# Patient Record
Sex: Male | Born: 1935
Health system: Southern US, Community
[De-identification: ages and names within clinical notes are randomized; demographics above are authoritative.]

## PROBLEM LIST (undated history)

## (undated) DIAGNOSIS — C4359 Malignant melanoma of other part of trunk: Secondary | ICD-10-CM

## (undated) DIAGNOSIS — N400 Enlarged prostate without lower urinary tract symptoms: Secondary | ICD-10-CM

## (undated) DIAGNOSIS — Z86010 Personal history of colonic polyps: Secondary | ICD-10-CM

## (undated) DIAGNOSIS — Z8739 Personal history of other diseases of the musculoskeletal system and connective tissue: Secondary | ICD-10-CM

## (undated) DIAGNOSIS — D229 Melanocytic nevi, unspecified: Secondary | ICD-10-CM

## (undated) DIAGNOSIS — K5790 Diverticulosis of intestine, part unspecified, without perforation or abscess without bleeding: Secondary | ICD-10-CM

## (undated) DIAGNOSIS — Z860101 Personal history of adenomatous and serrated colon polyps: Secondary | ICD-10-CM

## (undated) DIAGNOSIS — D696 Thrombocytopenia, unspecified: Secondary | ICD-10-CM

## (undated) DIAGNOSIS — J309 Allergic rhinitis, unspecified: Secondary | ICD-10-CM

## (undated) DIAGNOSIS — L570 Actinic keratosis: Secondary | ICD-10-CM

## (undated) DIAGNOSIS — H409 Unspecified glaucoma: Secondary | ICD-10-CM

## (undated) DIAGNOSIS — M7580 Other shoulder lesions, unspecified shoulder: Secondary | ICD-10-CM

## (undated) HISTORY — PX: OTHER SURGICAL HISTORY: SHX169

## (undated) HISTORY — DX: Malignant melanoma of other part of trunk: C43.59

## (undated) HISTORY — DX: Diverticulosis of intestine, part unspecified, without perforation or abscess without bleeding: K57.90

## (undated) HISTORY — DX: Actinic keratosis: L57.0

## (undated) HISTORY — PX: HERNIA REPAIR: SHX51

## (undated) HISTORY — DX: Personal history of adenomatous and serrated colon polyps: Z86.0101

## (undated) HISTORY — DX: Personal history of colonic polyps: Z86.010

## (undated) HISTORY — DX: Personal history of other diseases of the musculoskeletal system and connective tissue: Z87.39

## (undated) HISTORY — DX: Benign prostatic hyperplasia without lower urinary tract symptoms: N40.0

## (undated) HISTORY — DX: Melanocytic nevi, unspecified: D22.9

## (undated) HISTORY — DX: Thrombocytopenia, unspecified: D69.6

## (undated) HISTORY — DX: Other shoulder lesions, unspecified shoulder: M75.80

## (undated) HISTORY — DX: Unspecified glaucoma: H40.9

## (undated) HISTORY — DX: Allergic rhinitis, unspecified: J30.9

---

## 1998-07-23 ENCOUNTER — Encounter: Admission: RE | Admit: 1998-07-23 | Discharge: 1998-08-16 | Payer: Self-pay | Admitting: Neurological Surgery

## 2000-11-13 ENCOUNTER — Encounter: Payer: Self-pay | Admitting: Family Medicine

## 2000-11-13 ENCOUNTER — Ambulatory Visit (HOSPITAL_COMMUNITY): Admission: RE | Admit: 2000-11-13 | Discharge: 2000-11-13 | Payer: Self-pay | Admitting: Family Medicine

## 2001-08-30 ENCOUNTER — Ambulatory Visit (HOSPITAL_BASED_OUTPATIENT_CLINIC_OR_DEPARTMENT_OTHER): Admission: RE | Admit: 2001-08-30 | Discharge: 2001-08-30 | Payer: Self-pay | Admitting: Plastic Surgery

## 2001-08-30 ENCOUNTER — Encounter (INDEPENDENT_AMBULATORY_CARE_PROVIDER_SITE_OTHER): Payer: Self-pay | Admitting: Specialist

## 2002-07-18 ENCOUNTER — Ambulatory Visit (HOSPITAL_COMMUNITY): Admission: RE | Admit: 2002-07-18 | Discharge: 2002-07-18 | Payer: Self-pay | Admitting: Gastroenterology

## 2007-04-14 ENCOUNTER — Encounter: Admission: RE | Admit: 2007-04-14 | Discharge: 2007-04-14 | Payer: Self-pay | Admitting: Surgery

## 2007-04-19 ENCOUNTER — Ambulatory Visit (HOSPITAL_BASED_OUTPATIENT_CLINIC_OR_DEPARTMENT_OTHER): Admission: RE | Admit: 2007-04-19 | Discharge: 2007-04-19 | Payer: Self-pay | Admitting: Surgery

## 2010-05-21 NOTE — Op Note (Signed)
NAMEMICHALE, EMMERICH             ACCOUNT NO.:  [id number]   MEDICAL RECORD NO.:  [id number]          PATIENT TYPE:  AMB   LOCATION:  DSC                          FACILITY:  [facility]   PHYSICIAN:  Currie Paris, M.D.DATE OF BIRTH:  08-Mar-1930   DATE OF PROCEDURE:  04/19/2007  DATE OF DISCHARGE:                               OPERATIVE REPORT   PREOPERATIVE DIAGNOSIS:  Left inguinal hernia.   POSTOPERATIVE DIAGNOSIS:  Left inguinal hernia - direct.   SURGEON:  Currie Paris, M.D.   ANESTHESIA:  General.   CLINICAL HISTORY:  Mr. Grasse is a 77 year old gentleman has a  gradually enlarging left inguinal hernia he wished to have repaired.   DESCRIPTION OF PROCEDURE:  The patient was seen in the holding area, and  he had no further questions.  We reviewed plans for the surgery, and we  both initialed the left side as the operative side.   The patient was taken to the operating room after satisfactory general  (LMA).  Anesthesia having obtained, the left inguinal area was clipped,  prepped, and draped.  The time-out was done.   I used 0.25% plain Marcaine, which I infiltrated along the incision  line, into the subcutaneous tissues, and below the external oblique to  help with postop analgesia.   An oblique incision was made and deepened to the external oblique  aponeurosis with bleeders tied or cauterized.  The aponeurosis was  opened in line of its fibers into the superficial ring.   Cord structures were dissected up off the inguinal floor.  There was no  indirect sac present.  There was a large direct defect involving most of  the floor, and it was stuck predominantly to the very distal side of the  cord medially.  I was able to strip all this off and get the cord  completely freed up and the hernia completely reduced such that I could  easily see the fascia and the pubic tubercle.  I took a 2-0 Prolene and  tried to close the transversalis running medially to  laterally so that  it could hold the hernia in place, but with no tension on this.  I then  took a piece of Marlex mesh and cut it to fit, tapered medially, and  slid laterally so that it could go around the cord.  This was secured  into the patient with a running 2-0 Prolene starting at the tubercle and  running laterally to the deep ring.  It was slid to go around the cord  and then tacked to the internal oblique medially such that we had a full  coverage of the entire floor, and it extended well lateral to the deep  ring.   I put more Marcaine in as we worked, and I think the patient should have  good postop pain control from this.  I irrigated and made sure  everything was dry.  I then closed with 3-0 Vicryl to close the external  oblique, 3-0 Vicryl on Scarpa's, and a 4-0 Monocryl subcuticular plus  Dermabond for the skin.  The patient tolerated the procedure  well, and  there were no complications.      Currie Paris, M.D.  Electronically Signed     CJS/MEDQ  D:  04/19/2007  T:  04/20/2007  Job:  [id number]   cc:   Soyla Murphy. Renne Crigler, M.D.

## 2010-05-24 NOTE — Op Note (Signed)
   NAME:  Brandon Norris, Brandon Norris [facility]                    ACCOUNT NO.:  [id number]   MEDICAL RECORD NO.:  [id number]                   PATIENT TYPE:  AMB   LOCATION:  ENDO                                 FACILITY:  [facility]   PHYSICIAN:  Bernette Redbird, M.D.                DATE OF BIRTH:  07-17-1930   DATE OF PROCEDURE:  07/18/2002  DATE OF DISCHARGE:                                 OPERATIVE REPORT   PROCEDURE PERFORMED:  Colonoscopy.   ENDOSCOPIST:  Florencia Reasons, M.D.   INDICATIONS FOR PROCEDURE:  Screening for colon cancer in a 77 year old male  with occasional small volume hematochezia.   FINDINGS:  Sigmoid diverticulosis.   DESCRIPTION OF PROCEDURE:  The nature, purpose and risks of the procedure  had been discussed with the patient, who provided written consent.  Sedation  was fentanyl 20 mcg and Versed 3 mg IV without arrhythmias or desaturation.  Digital exam of the prostate was unremarkable.   The Olympus adjustable tension pediatric video colonoscope was advanced to  the terminal ileum and pullback was then performed.  Apart from fairly  significant sigmoid diverticulosis, this was a normal examination.  The  terminal ileum looked normal and the colon was free of any polyps, cancer,  colitis or vascular malformations.  The quality of the prep was very good  and it is felt that all areas were adequately seen.  Retroflexion in the  rectum and reinspection of the rectosigmoid was unremarkable.   There were perhaps very mild internal hemorrhoids during pullout through the  anal canal but nothing particularly impressive.  No biopsies were obtained.  The patient tolerated the procedure well and there were no apparent  complications.   IMPRESSION:  1. Sigmoid diverticulosis.  2.     Intermittent rectal bleeding presumably of hemorrhoidal origin given the     absence of alternative explanations (the character of his bleeding does     not sound diverticular).   PLAN:  Flexible  sigmoidoscopy in five years for continued screening.                                                Bernette Redbird, M.D.    RB/MEDQ  D:  07/18/2002  T:  07/18/2002  Job:  [id number]   cc:   Meredith Staggers, M.D.  [address], [address]  [place]  [place] [address]  Fax: [phone]

## 2010-05-24 NOTE — Op Note (Signed)
NAME:  Brandon Norris, Brandon Norris [facility]                    ACCOUNT NO.:  [id number]   MEDICAL RECORD NO.:  [id number]                   PATIENT TYPE:  AMB   LOCATION:  DSC                                  FACILITY:  [facility]   PHYSICIAN:  Teena Irani. Odis Luster, M.D.               DATE OF BIRTH:  07-19-1930   DATE OF PROCEDURE:  08/30/2001  DATE OF DISCHARGE:  08/30/2001                                 OPERATIVE REPORT   PREOPERATIVE DIAGNOSIS:  Basal cell carcinoma of the forehead, greater than  0.5 cm in diameter.   POSTOPERATIVE DIAGNOSES:  1. Basal cell carcinoma of the forehead, greater than 0.5 cm in diameter.  2. Complicated open wound of the forehead, greater than 2.5 cm.   PROCEDURES:  1. Excision of basal cell carcinoma greater than 0.5 cm in diameter.  2. Complex wound closure of the forehead, greater than 2.5 cm.   SURGEON:  Teena Irani. Odis Luster, M.D.   ANESTHESIA:  1% Xylocaine with epinephrine plus bicarbonate.   CLINICAL NOTE:  A 77 year old man with a basal cell carcinoma of his  forehead identified on punch biopsy by his dermatologist.  This is fairly  large, and he presents for a wide excision of this area.  The nature of the  procedure and the risks were discussed with him, including the possibility  of further surgery depending upon the final pathology report.  He understood  all this and wished to proceed.   DESCRIPTION OF PROCEDURE:  The patient was placed supine on the operating  table.  The excision was then marked, taking approximately 4 mm margins  around this tumor.  The incisions were then placed within skin creased with  the relaxed skin tension lines.  He was prepped with Betadine and draped  with sterile drapes.  Satisfactory local anesthesia with 1% Xylocaine with  epinephrine plus bicarb was then achieved, and the elliptical excision was  performed with the specimen tagged on its anterior border with a silk  stitch.  The wound was irrigated thoroughly.  The closure in  layers with 4-0  Vicryl interrupted, inverted deep sutures, 4-0 Vicryl interrupted, inverted  subcutaneous sutures, and 6-0 Prolene simple running sutures.  Antibiotic  ointment and a dry sterile dressing with a little bit of pressure were  applied, and he tolerated the procedure well.   DISPOSITION:  No vigorous activity, no lifting today.  He will leave the  dressing in place until tomorrow night, and then he may remove it and shower  if he so desires.  Antibiotic ointment, Polysporin, a couple of times a day.  Will see him back in the office next week for recheck and suture removal.  Teena Irani. Odis Luster, M.D.    DMB/MEDQ  D:  08/30/2001  T:  09/01/2001  Job:  [id number]

## 2010-10-01 LAB — POCT HEMOGLOBIN-HEMACUE: Hemoglobin: 14

## 2014-01-06 DIAGNOSIS — C4359 Malignant melanoma of other part of trunk: Secondary | ICD-10-CM

## 2014-01-06 HISTORY — PX: OTHER SURGICAL HISTORY: SHX169

## 2014-01-06 HISTORY — DX: Malignant melanoma of other part of trunk: C43.59

## 2015-02-15 DIAGNOSIS — H2513 Age-related nuclear cataract, bilateral: Secondary | ICD-10-CM | POA: Diagnosis not present

## 2015-02-15 DIAGNOSIS — H401123 Primary open-angle glaucoma, left eye, severe stage: Secondary | ICD-10-CM | POA: Diagnosis not present

## 2015-02-15 DIAGNOSIS — H35372 Puckering of macula, left eye: Secondary | ICD-10-CM | POA: Diagnosis not present

## 2015-02-15 DIAGNOSIS — H401113 Primary open-angle glaucoma, right eye, severe stage: Secondary | ICD-10-CM | POA: Diagnosis not present

## 2015-08-15 DIAGNOSIS — H35372 Puckering of macula, left eye: Secondary | ICD-10-CM | POA: Diagnosis not present

## 2015-08-15 DIAGNOSIS — H401132 Primary open-angle glaucoma, bilateral, moderate stage: Secondary | ICD-10-CM | POA: Diagnosis not present

## 2015-10-30 DIAGNOSIS — Z23 Encounter for immunization: Secondary | ICD-10-CM | POA: Diagnosis not present

## 2016-01-09 DIAGNOSIS — Z7982 Long term (current) use of aspirin: Secondary | ICD-10-CM | POA: Diagnosis not present

## 2016-01-09 DIAGNOSIS — Z0000 Encounter for general adult medical examination without abnormal findings: Secondary | ICD-10-CM | POA: Diagnosis not present

## 2016-01-14 DIAGNOSIS — Z0001 Encounter for general adult medical examination with abnormal findings: Secondary | ICD-10-CM | POA: Diagnosis not present

## 2016-01-14 DIAGNOSIS — J309 Allergic rhinitis, unspecified: Secondary | ICD-10-CM | POA: Diagnosis not present

## 2016-01-14 DIAGNOSIS — K5730 Diverticulosis of large intestine without perforation or abscess without bleeding: Secondary | ICD-10-CM | POA: Diagnosis not present

## 2016-01-14 DIAGNOSIS — R238 Other skin changes: Secondary | ICD-10-CM | POA: Diagnosis not present

## 2016-01-14 DIAGNOSIS — Z1212 Encounter for screening for malignant neoplasm of rectum: Secondary | ICD-10-CM | POA: Diagnosis not present

## 2016-01-15 ENCOUNTER — Telehealth: Payer: Self-pay | Admitting: Oncology

## 2016-01-15 NOTE — Telephone Encounter (Signed)
Pt confirmed appt and verified demo and insurance. Contacted referring provider with appt date/time.

## 2016-01-16 ENCOUNTER — Ambulatory Visit (HOSPITAL_BASED_OUTPATIENT_CLINIC_OR_DEPARTMENT_OTHER): Payer: Medicare Other

## 2016-01-16 ENCOUNTER — Ambulatory Visit (HOSPITAL_BASED_OUTPATIENT_CLINIC_OR_DEPARTMENT_OTHER): Payer: Medicare Other | Admitting: Oncology

## 2016-01-16 ENCOUNTER — Telehealth: Payer: Self-pay | Admitting: Oncology

## 2016-01-16 ENCOUNTER — Other Ambulatory Visit: Payer: Self-pay | Admitting: *Deleted

## 2016-01-16 VITALS — BP 149/64 | HR 61 | Temp 97.6°F | Resp 18 | Ht 69.0 in | Wt 148.1 lb

## 2016-01-16 DIAGNOSIS — Z803 Family history of malignant neoplasm of breast: Secondary | ICD-10-CM

## 2016-01-16 DIAGNOSIS — H409 Unspecified glaucoma: Secondary | ICD-10-CM | POA: Insufficient documentation

## 2016-01-16 DIAGNOSIS — D696 Thrombocytopenia, unspecified: Secondary | ICD-10-CM

## 2016-01-16 DIAGNOSIS — Z86010 Personal history of colonic polyps: Secondary | ICD-10-CM

## 2016-01-16 DIAGNOSIS — D72829 Elevated white blood cell count, unspecified: Secondary | ICD-10-CM

## 2016-01-16 DIAGNOSIS — D72820 Lymphocytosis (symptomatic): Secondary | ICD-10-CM | POA: Diagnosis not present

## 2016-01-16 DIAGNOSIS — Z1211 Encounter for screening for malignant neoplasm of colon: Secondary | ICD-10-CM | POA: Insufficient documentation

## 2016-01-16 DIAGNOSIS — K5790 Diverticulosis of intestine, part unspecified, without perforation or abscess without bleeding: Secondary | ICD-10-CM | POA: Insufficient documentation

## 2016-01-16 DIAGNOSIS — J309 Allergic rhinitis, unspecified: Secondary | ICD-10-CM | POA: Insufficient documentation

## 2016-01-16 LAB — CBC WITH DIFFERENTIAL/PLATELET
BASO%: 0.1 % (ref 0.0–2.0)
BASOS ABS: 0 10*3/uL (ref 0.0–0.1)
EOS%: 0.8 % (ref 0.0–7.0)
Eosinophils Absolute: 0.1 10*3/uL (ref 0.0–0.5)
HCT: 39.6 % (ref 38.4–49.9)
HGB: 13.5 g/dL (ref 13.0–17.1)
LYMPH%: 63.4 % — AB (ref 14.0–49.0)
MCH: 32 pg (ref 27.2–33.4)
MCHC: 34.1 g/dL (ref 32.0–36.0)
MCV: 93.8 fL (ref 79.3–98.0)
MONO#: 0.9 10*3/uL (ref 0.1–0.9)
MONO%: 6.5 % (ref 0.0–14.0)
NEUT#: 4 10*3/uL (ref 1.5–6.5)
NEUT%: 29.2 % — AB (ref 39.0–75.0)
PLATELETS: 107 10*3/uL — AB (ref 140–400)
RBC: 4.22 10*6/uL (ref 4.20–5.82)
RDW: 13.9 % (ref 11.0–14.6)
WBC: 13.8 10*3/uL — ABNORMAL HIGH (ref 4.0–10.3)
lymph#: 8.7 10*3/uL — ABNORMAL HIGH (ref 0.9–3.3)
nRBC: 0 % (ref 0–0)

## 2016-01-16 LAB — TECHNOLOGIST REVIEW

## 2016-01-16 LAB — LACTATE DEHYDROGENASE: LDH: 219 U/L (ref 125–245)

## 2016-01-16 NOTE — Progress Notes (Signed)
[facility] Initial Visit:  No care team member to display  CHIEF COMPLAINTS/PURPOSE OF CONSULTATION: Persistent Leukocytosis  HISTORY OF PRESENTING ILLNESS: Brandon Norris 83 y.o. male is here because of persistent leukocytosis. Patient went to see his primary care physician Dr. [name] for a routine annual exam on 01/09/16 and had a CBC performed which demonstrated WBC 14.7 K, hemoglobin 13.7 g/dL, hematocrit 40.6%, platelet count 109K, differential demonstrated neutrophils 70.4%, lymphocytes 69%, monocytes 12.6%. Previous CBC 01/03/15 demonstrated WBC 12.8 K, hemoglobin 13.2 m/dL, hematocrit 39.4%, platelet 106K, differential 26% neutrophils, 71% lymphocytes, 1% monocytes. Patient states that he has been doing well. He denies any chronic/persistent infections or allergies. He denies any unexplained weight loss, appetite changes, night sweats. He denies any problems with bleeding or bruising. He denies any abdominal pain or early satiety. He does state that in the past he's been told that he's had an enlarged spleen, however he is not been evaluated for it in years. 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  Review of Systems  Constitutional: Negative for appetite change, chills, fatigue and fever.  HENT:   Negative for hearing loss, lump/mass, mouth sores, sore throat and tinnitus.   Eyes: Negative for eye problems and icterus.  Respiratory: Negative for chest tightness, cough, hemoptysis, shortness of breath and wheezing.   Cardiovascular: Negative for chest pain, leg swelling and palpitations.  Gastrointestinal: Negative for abdominal distention, abdominal pain, blood in stool, diarrhea, nausea and vomiting.  Endocrine: Negative.  Negative for hot flashes.  Genitourinary: Negative for difficulty urinating, frequency and hematuria.   Musculoskeletal: Negative for arthralgias and neck pain.  Skin: Negative for  itching and rash.  Neurological: Negative for dizziness, headaches and speech difficulty.  Hematological: Negative for adenopathy. Does not bruise/bleed easily.  Psychiatric/Behavioral: Negative for confusion. The patient is not nervous/anxious.     MEDICAL HISTORY: Past Medical History:  Diagnosis Date  . Glaucoma   . Malignant melanoma of skin of abdomen ([place]) 2016    SURGICAL HISTORY: Past Surgical History:  Procedure Laterality Date  . HERNIA REPAIR      SOCIAL HISTORY: Social History   Social History  . Marital status: Married    Spouse name: N/A  . Number of children: N/A  . Years of education: N/A   Occupational History  . Not on file.   Social History Main Topics  . Smoking status: Not on file  . Smokeless tobacco: Not on file  . Alcohol use Not on file  . Drug use: Unknown  . Sexual activity: Not on file   Other Topics Concern  . Not on file   Social History Narrative  . No narrative on file    FAMILY HISTORY Family History  Problem Relation Age of Onset  . Dementia Mother   . Breast cancer Sister     ALLERGIES:  has no allergies on file.  MEDICATIONS:  Current Outpatient Prescriptions  Medication Sig Dispense Refill  . latanoprost (XALATAN) 0.005 % ophthalmic solution Place 1 drop into both eyes daily.  1  . timolol (TIMOPTIC) 0.5 % ophthalmic solution Place 1 drop into both eyes daily.  0   No current facility-administered medications for this visit.     PHYSICAL EXAMINATION:  ECOG PERFORMANCE STATUS: 0 - Asymptomatic   Vitals:   01/16/16 1319  BP: (!) 149/64  Pulse: 61  Resp: 18  Temp: 97.6 F (36.4 C)    Filed  Weights   01/16/16 1319  Weight: 148 lb 1.6 oz (67.2 kg)     Physical Exam  Constitutional: He is oriented to person, place, and time and well-developed, well-nourished, and in no distress. No distress.  HENT:  Head: Normocephalic and atraumatic.  Mouth/Throat: No oropharyngeal exudate.  Eyes: Conjunctivae are  normal. Pupils are equal, round, and reactive to light. No scleral icterus.  Neck: Normal range of motion. Neck supple. No JVD present.  Cardiovascular: Normal rate, regular rhythm and normal heart sounds.  Exam reveals no gallop and no friction rub.   No murmur heard. Pulmonary/Chest: Breath sounds normal. No respiratory distress. He has no wheezes. He has no rales.  Abdominal: Soft. Bowel sounds are normal. He exhibits no distension. There is no hepatosplenomegaly. There is no tenderness. There is no guarding.  Musculoskeletal: He exhibits no edema or tenderness.  Lymphadenopathy:    He has no cervical adenopathy.    He has no axillary adenopathy.  Neurological: He is alert and oriented to person, place, and time. No cranial nerve deficit.  Skin: Skin is warm and dry. No rash noted. No erythema. No pallor.  Psychiatric: Affect and judgment normal.     LABORATORY DATA: I have personally reviewed the data as listed:  No visits with results within 1 Month(s) from this visit.  Latest known visit with results is:  Hospital Outpatient Visit on 04/19/2007  Component Date Value Ref Range Status  . Hemoglobin 10/01/2010 14.0 POINT OF CARE RESULT   Final    RADIOGRAPHIC STUDIES: I have personally reviewed the radiological images as listed and agree with the findings in the report  No results found.  ASSESSMENT/PLAN 83 year old gentleman presents today for evaluation of persistent leukocytosis and thrombocytopenia. I suspect that the patient may have chronic lymphocytic leukemia, however further workup is necessary. Thrombocytopenia is likely related to possible underlying CLL.  Plan:  I will check a CBC, CMP, LDH, LAP, flow cytometry, and a myeloproliferative panel for further workup.  I have ordered an abdominal ultrasound to rule out splenomegaly. Return to clinic in 1 week to review his lab results and ultrasound results, further plan of care will be dependent on his  results.  Orders Placed This Encounter  Procedures  . US Abdomen Complete    Standing Status:   Future    Standing Expiration Date:   01/15/2017    Order Specific Question:   Reason for Exam (SYMPTOM  OR DIAGNOSIS REQUIRED)    Answer:   h/o splenomegaly, leukocytosis with lymphocytic predominance    Order Specific Question:   Preferred imaging location?    Answer:   [facility]  . CBC with Differential    Standing Status:   Future    Standing Expiration Date:   01/15/2017  . Flow Cytometry    Standing Status:   Future    Standing Expiration Date:   01/15/2017  . Lactate dehydrogenase (LDH)    Standing Status:   Future    Standing Expiration Date:   01/15/2017  . Myeloproliferative Neoplasm Panel    Standing Status:   Future    Standing Expiration Date:   01/15/2017  . Leukocyte Alkaline Phosphatase    All questions were answered. The patient knows to call the clinic with any problems, questions or concerns.  This note was electronically signed.    Twana First, MD  01/16/2016 1:56 PM

## 2016-01-16 NOTE — Telephone Encounter (Signed)
GAVE PATIENT AVS REPORT AND APPOINTMENTS FOR January. PATIENT SENT BACK TO LAB.

## 2016-01-17 ENCOUNTER — Other Ambulatory Visit (HOSPITAL_COMMUNITY)
Admission: RE | Admit: 2016-01-17 | Discharge: 2016-01-17 | Disposition: A | Discharge status: Home / Self Care | Payer: Medicare Other | Source: Ambulatory Visit | Attending: Oncology | Admitting: Oncology

## 2016-01-17 DIAGNOSIS — D696 Thrombocytopenia, unspecified: Secondary | ICD-10-CM | POA: Insufficient documentation

## 2016-01-17 DIAGNOSIS — D72829 Elevated white blood cell count, unspecified: Secondary | ICD-10-CM | POA: Insufficient documentation

## 2016-01-21 ENCOUNTER — Ambulatory Visit (HOSPITAL_BASED_OUTPATIENT_CLINIC_OR_DEPARTMENT_OTHER): Payer: Medicare Other | Admitting: Oncology

## 2016-01-21 ENCOUNTER — Telehealth: Payer: Self-pay | Admitting: Oncology

## 2016-01-21 VITALS — BP 115/53 | HR 67 | Temp 98.1°F | Resp 18 | Ht 69.0 in | Wt 149.6 lb

## 2016-01-21 DIAGNOSIS — C9110 Chronic lymphocytic leukemia of B-cell type not having achieved remission: Secondary | ICD-10-CM | POA: Diagnosis not present

## 2016-01-21 LAB — LEUKOCYTE ALKALINE PHOSPHATASE: LEUKOCYTE ALKALINE PHOS SCORE: 47 (ref 25–130)

## 2016-01-21 NOTE — Telephone Encounter (Signed)
Gave patient avs report and appointments for April  °

## 2016-01-21 NOTE — Progress Notes (Signed)
[facility] Follow up:    No primary care provider on file. No primary provider on file.   DIAGNOSIS: CLL  SUMMARY OF ONCOLOGIC HISTORY:  No history exists.    CURRENT THERAPY: none  INTERVAL HISTORY: Brandon Norris 83 y.o. male returns for follow-up of persistent leukocytosis. Lab work performed on 01/16/16 demonstrated WBC 13.8 K, hemoglobin 13.5 g/dL, hematocrit 39.6%, platelet count 107K, differential demonstrated 29.2% neutrophils, 63.4% lymphs. Peripheral blood chronic flow cytometry 01/16/16 demonstrated a monoclonal B-cell population with features most compatible with chronic lymphocytic leukemia. Patient states that he feels well and has no new complaints since his last visit.   Patient Active Problem List   Diagnosis Date Noted  . Elevated WBC count 01/16/2016  . Glaucoma 01/16/2016  . Thrombocytopenia ([place]) 01/16/2016  . Diverticulosis 01/16/2016  . Encounter for colonoscopy due to history of adenomatous colonic polyps 01/16/2016  . Allergic rhinitis 01/16/2016    has No Known Allergies.  MEDICAL HISTORY: Past Medical History:  Diagnosis Date  . Actinic keratoses   . Allergic rhinitis   . Atypical nevi   . Diverticulosis   . Enlarged prostate   . Glaucoma   . Glaucoma   . H/O degenerative disc disease   . Hx of adenomatous colonic polyps   . Malignant melanoma of skin of abdomen ([place]) 2016  . Rotator cuff tendinitis   . Thrombocytopenia ([place])     SURGICAL HISTORY: Past Surgical History:  Procedure Laterality Date  . HERNIA REPAIR    . Left inguinal herniorrhaphy    . Melanoma on abdomen  2016    SOCIAL HISTORY: Social History   Social History  . Marital status: Married    Spouse name: N/A  . Number of children: N/A  . Years of education: N/A   Occupational History  . Not on file.   Social History Main Topics  . Smoking status: Never Smoker  . Smokeless tobacco: Not on file  . Alcohol use 1.8 oz/week    3  Glasses of wine per week  . Drug use: No  . Sexual activity: No   Other Topics Concern  . Not on file   Social History Narrative  . No narrative on file    FAMILY HISTORY: Family History  Problem Relation Age of Onset  . Dementia Mother   . Breast cancer Sister     Review of Systems  Constitutional: Negative for appetite change, chills, fatigue and fever.  HENT:   Negative for hearing loss, lump/mass, mouth sores, sore throat and tinnitus.   Eyes: Negative for eye problems and icterus.  Respiratory: Negative for chest tightness, cough, hemoptysis, shortness of breath and wheezing.   Cardiovascular: Negative for chest pain, leg swelling and palpitations.  Gastrointestinal: Negative for abdominal distention, abdominal pain, blood in stool, diarrhea, nausea and vomiting.  Endocrine: Negative.  Negative for hot flashes.  Genitourinary: Negative for difficulty urinating, frequency and hematuria.   Musculoskeletal: Negative for arthralgias and neck pain.  Skin: Negative for itching and rash.  Neurological: Negative for dizziness, headaches and speech difficulty.  Hematological: Negative for adenopathy. Does not bruise/bleed easily.  Psychiatric/Behavioral: Negative for confusion. The patient is not nervous/anxious.       PHYSICAL EXAMINATION  ECOG PERFORMANCE STATUS: 0 - Asymptomatic  There were no vitals filed for this visit.  Physical Exam  Constitutional: He is oriented to person, place, and time and well-developed, well-nourished, and in no distress. No distress.  HENT:  Head: Normocephalic  and atraumatic.  Mouth/Throat: No oropharyngeal exudate.  Eyes: Conjunctivae are normal. Pupils are equal, round, and reactive to light. No scleral icterus.  Neck: Normal range of motion. Neck supple. No JVD present.  Cardiovascular: Normal rate, regular rhythm and normal heart sounds.  Exam reveals no gallop and no friction rub.   No murmur heard. Pulmonary/Chest: Breath sounds  normal. No respiratory distress. He has no wheezes. He has no rales.  Abdominal: Soft. Bowel sounds are normal. He exhibits no distension. There is no tenderness. There is no guarding.  Musculoskeletal: He exhibits no edema or tenderness.  Lymphadenopathy:    He has no cervical adenopathy.  Neurological: He is alert and oriented to person, place, and time. No cranial nerve deficit.  Skin: Skin is warm and dry. No rash noted. No erythema. No pallor.  Psychiatric: Affect and judgment normal.    LABORATORY DATA:  CBC    Component Value Date/Time   WBC 13.8 (H) 01/16/2016 1440   RBC 4.22 01/16/2016 1440   HGB 13.5 01/16/2016 1440   HCT 39.6 01/16/2016 1440   PLT 107 (L) 01/16/2016 1440   MCV 93.8 01/16/2016 1440   MCH 32.0 01/16/2016 1440   MCHC 34.1 01/16/2016 1440   RDW 13.9 01/16/2016 1440   LYMPHSABS 8.7 (H) 01/16/2016 1440   MONOABS 0.9 01/16/2016 1440   EOSABS 0.1 01/16/2016 1440   BASOSABS 0.0 01/16/2016 1440    CMP  No results found for: NA, K, CL, CO2, GLUCOSE, BUN, CREATININE, CALCIUM, PROT, ALBUMIN, AST, ALT, ALKPHOS, BILITOT, GFRNONAA, GFRAA     PENDING LABS:   RADIOGRAPHIC STUDIES:  No results found.   PATHOLOGY: See surgical path report 01/16/16 for flow cytometry results.    ASSESSMENT and THERAPY PLAN:  83 year old man presented for evaluation persistent leukocytosis with peripheral blood flow cytometry confirming CLL.  PLAN: I have discussed in detail the diagnosis of CLL with the patient. Currently he is a RAI stage 0, however further staging workup needs to be done. He has a abdominal ultrasound scheduled for 01/23/16. Patient currently does not meet any of the criteria for initiating treatment including hemoglobin less than 11, platelet count less than 100,000, rapid doubling of his WBCs, or any evidence of severe B symptoms. For now I will continue close observation of his blood counts. I will also order for IR to perform a bone marrow biopsy and  aspirate at the time when I'm planning to initiate treatment. I have discussed the plan of care in detail with the patient including close observation, and he is agreeable at this time. Return to clinic in 3 months with a CBC, CMP, LDH prior to his next visit. All questions were answered. The patient knows to call the clinic with any problems, questions or concerns. We can certainly see the patient much sooner if necessary. This note was electronically signed. Twana First, MD 01/21/2016

## 2016-01-22 LAB — FLOW CYTOMETRY

## 2016-01-23 ENCOUNTER — Ambulatory Visit (HOSPITAL_COMMUNITY): Payer: Medicare Other

## 2016-01-28 ENCOUNTER — Ambulatory Visit (HOSPITAL_COMMUNITY)
Admission: RE | Admit: 2016-01-28 | Discharge: 2016-01-28 | Disposition: A | Discharge status: Home / Self Care | Payer: Medicare Other | Source: Ambulatory Visit | Attending: Oncology | Admitting: Oncology

## 2016-01-28 DIAGNOSIS — R161 Splenomegaly, not elsewhere classified: Secondary | ICD-10-CM | POA: Insufficient documentation

## 2016-01-28 DIAGNOSIS — N281 Cyst of kidney, acquired: Secondary | ICD-10-CM | POA: Diagnosis not present

## 2016-01-28 DIAGNOSIS — D72829 Elevated white blood cell count, unspecified: Secondary | ICD-10-CM | POA: Insufficient documentation

## 2016-02-08 DIAGNOSIS — C44519 Basal cell carcinoma of skin of other part of trunk: Secondary | ICD-10-CM | POA: Diagnosis not present

## 2016-02-08 DIAGNOSIS — D692 Other nonthrombocytopenic purpura: Secondary | ICD-10-CM | POA: Diagnosis not present

## 2016-02-08 DIAGNOSIS — L718 Other rosacea: Secondary | ICD-10-CM | POA: Diagnosis not present

## 2016-02-08 DIAGNOSIS — L821 Other seborrheic keratosis: Secondary | ICD-10-CM | POA: Diagnosis not present

## 2016-02-08 DIAGNOSIS — Z85828 Personal history of other malignant neoplasm of skin: Secondary | ICD-10-CM | POA: Diagnosis not present

## 2016-02-12 DIAGNOSIS — H2513 Age-related nuclear cataract, bilateral: Secondary | ICD-10-CM | POA: Diagnosis not present

## 2016-02-12 DIAGNOSIS — H35372 Puckering of macula, left eye: Secondary | ICD-10-CM | POA: Diagnosis not present

## 2016-03-03 DIAGNOSIS — Z85828 Personal history of other malignant neoplasm of skin: Secondary | ICD-10-CM | POA: Diagnosis not present

## 2016-03-03 DIAGNOSIS — C44519 Basal cell carcinoma of skin of other part of trunk: Secondary | ICD-10-CM | POA: Diagnosis not present

## 2016-04-16 ENCOUNTER — Telehealth: Payer: Self-pay | Admitting: Hematology

## 2016-04-16 ENCOUNTER — Other Ambulatory Visit (HOSPITAL_BASED_OUTPATIENT_CLINIC_OR_DEPARTMENT_OTHER): Payer: Medicare Other

## 2016-04-16 DIAGNOSIS — C9190 Lymphoid leukemia, unspecified not having achieved remission: Secondary | ICD-10-CM | POA: Diagnosis not present

## 2016-04-16 DIAGNOSIS — C9110 Chronic lymphocytic leukemia of B-cell type not having achieved remission: Secondary | ICD-10-CM

## 2016-04-16 LAB — COMPREHENSIVE METABOLIC PANEL
ALBUMIN: 4.2 g/dL (ref 3.5–5.0)
ALK PHOS: 75 U/L (ref 40–150)
ALT: 15 U/L (ref 0–55)
AST: 21 U/L (ref 5–34)
Anion Gap: 10 mEq/L (ref 3–11)
BILIRUBIN TOTAL: 1.04 mg/dL (ref 0.20–1.20)
BUN: 12.9 mg/dL (ref 7.0–26.0)
CO2: 28 mEq/L (ref 22–29)
CREATININE: 1 mg/dL (ref 0.7–1.3)
Calcium: 9.7 mg/dL (ref 8.4–10.4)
Chloride: 104 mEq/L (ref 98–109)
EGFR: 67 mL/min/{1.73_m2} — ABNORMAL LOW (ref >90)
Glucose: 101 mg/dl (ref 70–140)
Potassium: 4.4 mEq/L (ref 3.5–5.1)
SODIUM: 141 meq/L (ref 136–145)
TOTAL PROTEIN: 7.4 g/dL (ref 6.4–8.3)

## 2016-04-16 LAB — CBC WITH DIFFERENTIAL/PLATELET
BASO%: 0.2 % (ref 0.0–2.0)
BASOS ABS: 0 10*3/uL (ref 0.0–0.1)
EOS%: 1 % (ref 0.0–7.0)
Eosinophils Absolute: 0.1 10*3/uL (ref 0.0–0.5)
HEMATOCRIT: 41.5 % (ref 38.4–49.9)
HEMOGLOBIN: 14 g/dL (ref 13.0–17.1)
LYMPH#: 10.1 10*3/uL — AB (ref 0.9–3.3)
LYMPH%: 74.7 % — ABNORMAL HIGH (ref 14.0–49.0)
MCH: 32.2 pg (ref 27.2–33.4)
MCHC: 33.8 g/dL (ref 32.0–36.0)
MCV: 95.3 fL (ref 79.3–98.0)
MONO#: 0.6 10*3/uL (ref 0.1–0.9)
MONO%: 4.4 % (ref 0.0–14.0)
NEUT#: 2.7 10*3/uL (ref 1.5–6.5)
NEUT%: 19.7 % — ABNORMAL LOW (ref 39.0–75.0)
Platelets: 98 10*3/uL — ABNORMAL LOW (ref 140–400)
RBC: 4.35 10*6/uL (ref 4.20–5.82)
RDW: 13.6 % (ref 11.0–14.6)
WBC: 13.5 10*3/uL — ABNORMAL HIGH (ref 4.0–10.3)

## 2016-04-16 LAB — TECHNOLOGIST REVIEW

## 2016-04-16 LAB — LACTATE DEHYDROGENASE: LDH: 212 U/L (ref 125–245)

## 2016-04-16 NOTE — Telephone Encounter (Signed)
Spoke with patient re f/u 4/16 with Dr. Irene Limbo. Seen by Dr. Talbert Cage.

## 2016-04-21 ENCOUNTER — Encounter: Payer: Self-pay | Admitting: Hematology

## 2016-04-21 ENCOUNTER — Telehealth: Payer: Self-pay | Admitting: Hematology

## 2016-04-21 ENCOUNTER — Ambulatory Visit (HOSPITAL_BASED_OUTPATIENT_CLINIC_OR_DEPARTMENT_OTHER): Payer: Medicare Other | Admitting: Hematology

## 2016-04-21 VITALS — BP 141/60 | HR 61 | Temp 97.7°F | Resp 18 | Wt 151.1 lb

## 2016-04-21 DIAGNOSIS — C9110 Chronic lymphocytic leukemia of B-cell type not having achieved remission: Secondary | ICD-10-CM

## 2016-04-21 DIAGNOSIS — D696 Thrombocytopenia, unspecified: Secondary | ICD-10-CM

## 2016-04-21 DIAGNOSIS — R161 Splenomegaly, not elsewhere classified: Secondary | ICD-10-CM

## 2016-04-21 DIAGNOSIS — N281 Cyst of kidney, acquired: Secondary | ICD-10-CM

## 2016-04-21 NOTE — Telephone Encounter (Signed)
Appointments scheduled per 4.16.18 LOS. Patient given AVS report and calendars with future scheduled appointments. °

## 2016-04-23 NOTE — Progress Notes (Signed)
[name]  HEMATOLOGY ONCOLOGY PROGRESS NOTE  Date of service: .04/21/2016  Patient Care Team: [name], MD as PCP - General (Internal Medicine)  CC: f/u for CLL  Diagnosis: CLL  Current Treatment: Observation  INTERVAL HISTORY: Patient is here for scheduled follow-up of his newly diagnosed CLL. As previously seen by Dr. Talbert Cage.  He notes no new acute concerns. No fevers or chills no night sweats and no unexpected weight loss. WBC counts are not significantly changed. No new anemia or thrombocytopenia. No abdominal pain or distention. Patient notes that he has had splenomegaly for decades - unclear etiology.  REVIEW OF SYSTEMS:    10 Point review of systems of done and is negative except as noted above.  . Past Medical History:  Diagnosis Date  . Actinic keratoses   . Allergic rhinitis   . Atypical nevi   . Diverticulosis   . Enlarged prostate   . Glaucoma   . Glaucoma   . H/O degenerative disc disease   . Hx of adenomatous colonic polyps   . Malignant melanoma of skin of abdomen ([place]) 2016  . Rotator cuff tendinitis   . Thrombocytopenia ([place])     . Past Surgical History:  Procedure Laterality Date  . HERNIA REPAIR    . Left inguinal herniorrhaphy    . Melanoma on abdomen  2016    . Social History  Substance Use Topics  . Smoking status: Never Smoker  . Smokeless tobacco: Never Used  . Alcohol use 1.8 oz/week    3 Glasses of wine per week    ALLERGIES:  has No Known Allergies.  MEDICATIONS:  Current Outpatient Prescriptions  Medication Sig Dispense Refill  . aspirin 81 MG chewable tablet Chew 81 mg by mouth daily.    [name] latanoprost (XALATAN) 0.005 % ophthalmic solution Place 1 drop into both eyes daily.  1  . Multiple Vitamin (MULTIVITAMIN) tablet Take 1 tablet by mouth daily.    . timolol (TIMOPTIC) 0.5 % ophthalmic solution Place 1 drop into both eyes 2 (two) times daily.   0   No current facility-administered medications for this visit.     PHYSICAL  EXAMINATION: ECOG PERFORMANCE STATUS: 1 - Symptomatic but completely ambulatory  . Vitals:   04/21/16 1406  BP: (!) 141/60  Pulse: 61  Resp: 18  Temp: 97.7 F (36.5 C)    Filed Weights   04/21/16 1406  Weight: 151 lb 2 oz (68.5 kg)   .Body mass index is 22.32 kg/m.  GENERAL:alert, in no acute distress and comfortable SKIN: no acute rashes, no significant lesions EYES: conjunctiva are pink and non-injected, sclera anicteric OROPHARYNX: MMM, no exudates, no oropharyngeal erythema or ulceration NECK: supple, no JVD LYMPH:  no palpable lymphadenopathy in the cervical, axillary or inguinal regions LUNGS: clear to auscultation b/l with normal respiratory effort HEART: regular rate & rhythm ABDOMEN:  normoactive bowel sounds , non tender, not distended. Mild 1 finger breadth splenomegaly. No palpable hepatomegaly. Extremity: no pedal edema PSYCH: alert & oriented x 3 with fluent speech NEURO: no focal motor/sensory deficits  LABORATORY DATA:   I have reviewed the data as listed  . CBC Latest Ref Rng & Units 04/16/2016 01/16/2016 04/19/2007  WBC 4.0 - 10.3 10e3/uL 13.5(H) 13.8(H) -  Hemoglobin 13.0 - 17.1 g/dL 14.0 13.5 14.0 POINT OF CARE RESULT  Hematocrit 38.4 - 49.9 % 41.5 39.6 -  Platelets 140 - 400 10e3/uL 98(L) 107(L) -    . CMP Latest Ref Rng & Units 04/16/2016  Glucose 70 - 140 mg/dl 101  BUN 7.0 - 26.0 mg/dL 12.9  Creatinine 0.7 - 1.3 mg/dL 1.0  Sodium 136 - 145 mEq/L 141  Potassium 3.5 - 5.1 mEq/L 4.4  CO2 22 - 29 mEq/L 28  Calcium 8.4 - 10.4 mg/dL 9.7  Total Protein 6.4 - 8.3 g/dL 7.4  Total Bilirubin 0.20 - 1.20 mg/dL 1.04  Alkaline Phos 40 - 150 U/L 75  AST 5 - 34 U/L 21  ALT 0 - 55 U/L 15   [place] ABD (01/28/2016)  ABDOMEN ULTRASOUND COMPLETE  COMPARISON:  None.  FINDINGS: Gallbladder: No gallstones or wall thickening visualized. There is no pericholecystic fluid. No sonographic Murphy sign noted by sonographer.  Common bile duct: Diameter: 2 mm.  There is no intrahepatic, common hepatic, or common bile duct dilatation.  Liver: No focal lesion identified. Within normal limits in parenchymal echogenicity.  IVC: No abnormality visualized.  Pancreas: Visualized portion unremarkable. Portions of pancreas obscured by gas.  Spleen: Spleen measures 16.0 x 14.2 x 5.8 cm with a measures splenic volume of 621 cubic cm. No focal splenic lesions are evident.  Right Kidney: Length: 11.4 cm. Echogenicity within normal limits. No hydronephrosis visualized. There is a parapelvic cyst in the left kidney measuring 5.1 x 3.5 x 6.3 cm.  Left Kidney: Length: 11.1 cm. Echogenicity within normal limits. No mass or hydronephrosis visualized.  Abdominal aorta: No aneurysm visualized.  Other findings: No demonstrable ascites.  IMPRESSION: Splenomegaly.  No focal splenic lesions evident.  Parapelvic cyst right kidney measuring 5.1 x 3.5 x 6.3 cm.  Portions of pancreas obscured by gas. Visualized portions of pancreas appear normal.  Study otherwise unremarkable.   Electronically Signed   By: Lowella Grip III M.D.   On: 01/28/2016 14:09    RADIOGRAPHIC STUDIES: I have personally reviewed the radiological images as listed and agreed with the findings in the report. No results found.  ASSESSMENT & PLAN:   83 yo with   1) Rai Stage 0 chronic lymphocytic leukemia.  Would be stage II if splenomegaly was considered to be related to the CLL. Patient however notes that he has had splenomegaly for decades without clear etiology noted previously.  WBC counts are not changed significantly. No anemia Mild thrombocytopenia that is not significantly changed platelets around 100k No constitutional symptoms. No issues with infections/bleeding. No clinically palpable lymphadenopathy Plan -Discussed questions patient had about his diagnosis of CLL. -We discussed the natural history, prognosis, treatment criteria as well as  warning signs to monitor for and report. -No indications for treatment of his CLL at this time. -will plan to sent a CLL prognostic FISH panel with next labs  2) Incidentally noted left parapelvic right kidney cyst measuring 5.1 x 3.5 x 6.3 cm in size. Plan -Would recommend CT scan of the abdomen and urology evaluation if needed by primary care physician.  3) . Patient Active Problem List   Diagnosis Date Noted  . CLL (chronic lymphocytic leukemia) ([place]) 01/21/2016  . Elevated WBC count 01/16/2016  . Glaucoma 01/16/2016  . Thrombocytopenia ([place]) 01/16/2016  . Diverticulosis 01/16/2016  . Encounter for colonoscopy due to history of adenomatous colonic polyps 01/16/2016  . Allergic rhinitis 01/16/2016   Plan -f/u with PCP for mx   RTC in 4 months with Dr Irene Limbo with labs  I spent 20 minutes counseling the patient face to face. The total time spent in the appointment was 25 minutes and more than 50% was on counseling and direct patient cares.  Sullivan Lone MD [place] AAHIVMS [facility] Hematology/Oncology Physician [facility]  (Office):       [phone] (Work cell):  [phone] (Fax):           [phone]

## 2016-08-10 ENCOUNTER — Telehealth: Payer: Self-pay

## 2016-08-10 NOTE — Telephone Encounter (Signed)
Spoke with patient and moved appt as previously scheduled due to needing pm appts

## 2016-08-18 ENCOUNTER — Other Ambulatory Visit: Payer: Medicare Other

## 2016-08-18 ENCOUNTER — Ambulatory Visit: Payer: Medicare Other | Admitting: Hematology

## 2016-08-19 DIAGNOSIS — H401133 Primary open-angle glaucoma, bilateral, severe stage: Secondary | ICD-10-CM | POA: Diagnosis not present

## 2016-08-19 DIAGNOSIS — H35372 Puckering of macula, left eye: Secondary | ICD-10-CM | POA: Diagnosis not present

## 2016-08-19 DIAGNOSIS — H2513 Age-related nuclear cataract, bilateral: Secondary | ICD-10-CM | POA: Diagnosis not present

## 2016-08-20 ENCOUNTER — Other Ambulatory Visit (HOSPITAL_BASED_OUTPATIENT_CLINIC_OR_DEPARTMENT_OTHER): Payer: Medicare Other

## 2016-08-20 ENCOUNTER — Telehealth: Payer: Self-pay | Admitting: Hematology

## 2016-08-20 ENCOUNTER — Ambulatory Visit (HOSPITAL_BASED_OUTPATIENT_CLINIC_OR_DEPARTMENT_OTHER): Payer: Medicare Other | Admitting: Hematology

## 2016-08-20 ENCOUNTER — Encounter: Payer: Self-pay | Admitting: Hematology

## 2016-08-20 VITALS — BP 130/58 | HR 61 | Temp 98.2°F | Resp 17 | Ht 69.0 in | Wt 149.8 lb

## 2016-08-20 DIAGNOSIS — C9190 Lymphoid leukemia, unspecified not having achieved remission: Secondary | ICD-10-CM | POA: Diagnosis not present

## 2016-08-20 DIAGNOSIS — C9110 Chronic lymphocytic leukemia of B-cell type not having achieved remission: Secondary | ICD-10-CM

## 2016-08-20 DIAGNOSIS — R161 Splenomegaly, not elsewhere classified: Secondary | ICD-10-CM | POA: Diagnosis not present

## 2016-08-20 DIAGNOSIS — N281 Cyst of kidney, acquired: Secondary | ICD-10-CM

## 2016-08-20 DIAGNOSIS — D696 Thrombocytopenia, unspecified: Secondary | ICD-10-CM | POA: Diagnosis not present

## 2016-08-20 LAB — CBC & DIFF AND RETIC
BASO%: 0.3 % (ref 0.0–2.0)
BASOS ABS: 0 10*3/uL (ref 0.0–0.1)
EOS ABS: 0.2 10*3/uL (ref 0.0–0.5)
EOS%: 1.2 % (ref 0.0–7.0)
HEMATOCRIT: 40 % (ref 38.4–49.9)
HEMOGLOBIN: 13.5 g/dL (ref 13.0–17.1)
Immature Retic Fract: 3 % (ref 3.00–10.60)
LYMPH%: 72.4 % — AB (ref 14.0–49.0)
MCH: 32.2 pg (ref 27.2–33.4)
MCHC: 33.6 g/dL (ref 32.0–36.0)
MCV: 95.6 fL (ref 79.3–98.0)
MONO#: 0.7 10*3/uL (ref 0.1–0.9)
MONO%: 4.8 % (ref 0.0–14.0)
NEUT#: 3.3 10*3/uL (ref 1.5–6.5)
NEUT%: 21.3 % — ABNORMAL LOW (ref 39.0–75.0)
Platelets: 104 10*3/uL — ABNORMAL LOW (ref 140–400)
RBC: 4.18 10*6/uL — ABNORMAL LOW (ref 4.20–5.82)
RDW: 13.9 % (ref 11.0–14.6)
RETIC %: 1.48 % (ref 0.80–1.80)
Retic Ct Abs: 61.86 10*3/uL (ref 34.80–93.90)
WBC: 15.7 10*3/uL — AB (ref 4.0–10.3)
lymph#: 11.4 10*3/uL — ABNORMAL HIGH (ref 0.9–3.3)

## 2016-08-20 LAB — COMPREHENSIVE METABOLIC PANEL
ALBUMIN: 3.9 g/dL (ref 3.5–5.0)
ALK PHOS: 72 U/L (ref 40–150)
ALT: 12 U/L (ref 0–55)
ANION GAP: 9 meq/L (ref 3–11)
AST: 19 U/L (ref 5–34)
BUN: 15.7 mg/dL (ref 7.0–26.0)
CALCIUM: 9.5 mg/dL (ref 8.4–10.4)
CHLORIDE: 104 meq/L (ref 98–109)
CO2: 27 mEq/L (ref 22–29)
Creatinine: 1 mg/dL (ref 0.7–1.3)
EGFR: 71 mL/min/{1.73_m2} — ABNORMAL LOW (ref >90)
Glucose: 98 mg/dl (ref 70–140)
POTASSIUM: 4.4 meq/L (ref 3.5–5.1)
Sodium: 141 mEq/L (ref 136–145)
Total Bilirubin: 0.97 mg/dL (ref 0.20–1.20)
Total Protein: 7.3 g/dL (ref 6.4–8.3)

## 2016-08-20 LAB — LACTATE DEHYDROGENASE: LDH: 217 U/L (ref 125–245)

## 2016-08-20 LAB — TECHNOLOGIST REVIEW

## 2016-08-20 NOTE — Telephone Encounter (Signed)
Gave pt avs and calendar  °

## 2016-08-20 NOTE — Patient Instructions (Signed)
Thank you for choosing [facility] to provide your oncology and hematology care.  To afford each patient quality time with our providers, please arrive 30 minutes before your scheduled appointment time.  If you arrive late for your appointment, you may be asked to reschedule.  We strive to give you quality time with our providers, and arriving late affects you and other patients whose appointments are after yours.  If you are a no show for multiple scheduled visits, you may be dismissed from the clinic at the providers discretion.   Again, thank you for choosing [facility], our hope is that these requests will decrease the amount of time that you wait before being seen by our physicians.  ______________________________________________________________________ Should you have questions after your visit to the [facility], please contact our office at [phone] between the hours of 8:30 and 4:30 p.m.    Voicemails left after 4:30p.m will not be returned until the following business day.   For prescription refill requests, please have your pharmacy contact us directly.  Please also try to allow 48 hours for prescription requests.   Please contact the scheduling department for questions regarding scheduling.  For scheduling of procedures such as PET scans, CT scans, MRI, Ultrasound, etc please contact central scheduling at [phone].   Resources For Cancer Patients and Caregivers:  American Cancer Society:  800-227-2345  Can help patients locate various types of support and financial assistance Cancer Care: 1-800-813-HOPE (4673) Provides financial assistance, online support groups, medication/co-pay assistance.   [place] DSS:  336-641-3447 Where to apply for food stamps, Medicaid, and utility assistance Medicare Rights Center: 800-333-4114 Helps people with Medicare understand their rights and benefits, navigate the Medicare system, and secure the  quality healthcare they deserve SCAT: 336-333-6589 [place] [employer]'s shared-ride transportation service for eligible riders who have a disability that prevents them from riding the fixed route bus.   For additional information on assistance programs please contact our social worker:   Grier Hock/Abigail Elmore:  [phone] 

## 2016-08-24 NOTE — Progress Notes (Signed)
[name]  HEMATOLOGY ONCOLOGY PROGRESS NOTE  Date of service: .08/20/2016  Patient Care Team: [name], MD as PCP - General (Internal Medicine)  CC: f/u for CLL  Diagnosis: CLL  Current Treatment: Observation  INTERVAL HISTORY: Patient is here for scheduled follow-up of his recently diagnosed CLL.  He notes he feels well. A little tired from being the primary caregiver to his ailing wife. He notes no new acute concerns. No fevers or chills no night sweats and no unexpected weight loss. WBC counts are not significantly changed. No new anemia or thrombocytopenia. Has had chronic thrombocytopenia and report idiopathic splenomegaly "for years". No abdominal pain or distention.  REVIEW OF SYSTEMS:    10 Point review of systems of done and is negative except as noted above.  . Past Medical History:  Diagnosis Date  . Actinic keratoses   . Allergic rhinitis   . Atypical nevi   . Diverticulosis   . Enlarged prostate   . Glaucoma   . Glaucoma   . H/O degenerative disc disease   . Hx of adenomatous colonic polyps   . Malignant melanoma of skin of abdomen ([place]) 2016  . Rotator cuff tendinitis   . Thrombocytopenia ([place])     . Past Surgical History:  Procedure Laterality Date  . HERNIA REPAIR    . Left inguinal herniorrhaphy    . Melanoma on abdomen  2016    . Social History  Substance Use Topics  . Smoking status: Never Smoker  . Smokeless tobacco: Never Used  . Alcohol use 1.8 oz/week    3 Glasses of wine per week    ALLERGIES:  has No Known Allergies.  MEDICATIONS:  Current Outpatient Prescriptions  Medication Sig Dispense Refill  . aspirin 81 MG chewable tablet Chew 81 mg by mouth daily.    [name] latanoprost (XALATAN) 0.005 % ophthalmic solution Place 1 drop into both eyes daily.  1  . Multiple Vitamin (MULTIVITAMIN) tablet Take 1 tablet by mouth daily.    . timolol (TIMOPTIC) 0.5 % ophthalmic solution Place 1 drop into both eyes 2 (two) times daily.   0   No  current facility-administered medications for this visit.     PHYSICAL EXAMINATION: ECOG PERFORMANCE STATUS: 1 - Symptomatic but completely ambulatory  . Vitals:   08/20/16 1433  BP: (!) 130/58  Pulse: 61  Resp: 17  Temp: 98.2 F (36.8 C)  SpO2: 96%    Filed Weights   08/20/16 1433  Weight: 149 lb 12.8 oz (67.9 kg)   .Body mass index is 22.12 kg/m.  GENERAL:alert, in no acute distress and comfortable SKIN: no acute rashes, no significant lesions EYES: conjunctiva are pink and non-injected, sclera anicteric OROPHARYNX: MMM, no exudates, no oropharyngeal erythema or ulceration NECK: supple, no JVD LYMPH:  no palpable lymphadenopathy in the cervical, axillary or inguinal regions LUNGS: clear to auscultation b/l with normal respiratory effort HEART: regular rate & rhythm ABDOMEN:  normoactive bowel sounds , non tender, not distended. Mild 1 finger breadth splenomegaly. No palpable hepatomegaly. Extremity: no pedal edema PSYCH: alert & oriented x 3 with fluent speech NEURO: no focal motor/sensory deficits  LABORATORY DATA:   I have reviewed the data as listed  . CBC Latest Ref Rng & Units 08/20/2016 04/16/2016 01/16/2016  WBC 4.0 - 10.3 10e3/uL 15.7(H) 13.5(H) 13.8(H)  Hemoglobin 13.0 - 17.1 g/dL 13.5 14.0 13.5  Hematocrit 38.4 - 49.9 % 40.0 41.5 39.6  Platelets 140 - 400 10e3/uL 104(L) 98(L) 107(L)    .  CMP Latest Ref Rng & Units 08/20/2016 04/16/2016  Glucose 70 - 140 mg/dl 98 101  BUN 7.0 - 26.0 mg/dL 15.7 12.9  Creatinine 0.7 - 1.3 mg/dL 1.0 1.0  Sodium 136 - 145 mEq/L 141 141  Potassium 3.5 - 5.1 mEq/L 4.4 4.4  CO2 22 - 29 mEq/L 27 28  Calcium 8.4 - 10.4 mg/dL 9.5 9.7  Total Protein 6.4 - 8.3 g/dL 7.3 7.4  Total Bilirubin 0.20 - 1.20 mg/dL 0.97 1.04  Alkaline Phos 40 - 150 U/L 72 75  AST 5 - 34 U/L 19 21  ALT 0 - 55 U/L 12 15   [place] ABD (01/28/2016)  ABDOMEN ULTRASOUND COMPLETE  COMPARISON:  None.  FINDINGS: Gallbladder: No gallstones or wall  thickening visualized. There is no pericholecystic fluid. No sonographic Murphy sign noted by sonographer.  Common bile duct: Diameter: 2 mm. There is no intrahepatic, common hepatic, or common bile duct dilatation.  Liver: No focal lesion identified. Within normal limits in parenchymal echogenicity.  IVC: No abnormality visualized.  Pancreas: Visualized portion unremarkable. Portions of pancreas obscured by gas.  Spleen: Spleen measures 16.0 x 14.2 x 5.8 cm with a measures splenic volume of 621 cubic cm. No focal splenic lesions are evident.  Right Kidney: Length: 11.4 cm. Echogenicity within normal limits. No hydronephrosis visualized. There is a parapelvic cyst in the left kidney measuring 5.1 x 3.5 x 6.3 cm.  Left Kidney: Length: 11.1 cm. Echogenicity within normal limits. No mass or hydronephrosis visualized.  Abdominal aorta: No aneurysm visualized.  Other findings: No demonstrable ascites.  IMPRESSION: Splenomegaly.  No focal splenic lesions evident.  Parapelvic cyst right kidney measuring 5.1 x 3.5 x 6.3 cm.  Portions of pancreas obscured by gas. Visualized portions of pancreas appear normal.  Study otherwise unremarkable.   Electronically Signed   By: Lowella Grip III M.D.   On: 01/28/2016 14:09    RADIOGRAPHIC STUDIES: I have personally reviewed the radiological images as listed and agreed with the findings in the report. No results found.  ASSESSMENT & PLAN:   83 yo with   1) Rai Stage 0 chronic lymphocytic leukemia.  Would be stage II if splenomegaly was considered to be related to the CLL. Patient however notes that he has had splenomegaly for decades without clear etiology noted previously.  WBC counts are not changed significantly. No anemia Mild thrombocytopenia that is not significantly changed platelets around 104k No constitutional symptoms. No issues with infections/bleeding. No clinically palpable  lymphadenopathy Plan -labs were reviewed and discussed with the patient. -he has no new clinical sypmtoms suggestive of CLL progression.  -No indications for treatment of his CLL at this time. -CLL prognostic FISH panel sent out with labs today.  2) Incidentally noted left parapelvic right kidney cyst measuring 5.1 x 3.5 x 6.3 cm in size. Plan -Would recommend CT scan of the abdomen and urology evaluation  by primary care physician.  3) . Patient Active Problem List   Diagnosis Date Noted  . CLL (chronic lymphocytic leukemia) ([place]) 01/21/2016  . Elevated WBC count 01/16/2016  . Glaucoma 01/16/2016  . Thrombocytopenia ([place]) 01/16/2016  . Diverticulosis 01/16/2016  . Encounter for colonoscopy due to history of adenomatous colonic polyps 01/16/2016  . Allergic rhinitis 01/16/2016   Plan -f/u with PCP for mx   RTC in 6 months with Dr Irene Limbo with labs  I spent 20 minutes counseling the patient face to face. The total time spent in the appointment was 20 minutes and more  than 50% was on counseling and direct patient cares.    Sullivan Lone MD [place] AAHIVMS [facility] Hematology/Oncology Physician [facility]  (Office):       [phone] (Work cell):  [phone] (Fax):           [phone]

## 2016-09-04 LAB — FISH,CLL PROGNOSTIC PANEL

## 2016-10-09 ENCOUNTER — Telehealth: Payer: Self-pay

## 2016-10-09 NOTE — Telephone Encounter (Signed)
Pt called earlier with question regarding bill from [facility] for [name] test. Pt stated that he already paid a bill to [facility] for testing and wanted to make sure that this was a legitimate bill. Message left with Tammi to leave for [facility] in billing at [facility] for clarity regarding this situation. Told her Delle Reining, RN will be at the desk tomorrow. Left a message with the pt to call back noon or 1pm tomorrow if he has not heard anything from [place] yet.

## 2016-10-10 ENCOUNTER — Telehealth: Payer: Self-pay | Admitting: *Deleted

## 2016-10-10 NOTE — Telephone Encounter (Signed)
Received call from pathology stating they were unsure about bill pt received for [facility] panel from [facility].  This RN spoke with Tomi Bamberger in [facility] lab.  Per Tomi Bamberger, certain testing that is unable to be completed here in our lab is sent out to lab in [place].  The bill for the [facility] panel is therefore accurate.  Informed pt of this, pt verbalized understanding.  Thankful for call/no additional questions.

## 2016-11-21 DIAGNOSIS — Z23 Encounter for immunization: Secondary | ICD-10-CM | POA: Diagnosis not present

## 2017-01-14 DIAGNOSIS — Z0000 Encounter for general adult medical examination without abnormal findings: Secondary | ICD-10-CM | POA: Diagnosis not present

## 2017-01-14 DIAGNOSIS — Z7982 Long term (current) use of aspirin: Secondary | ICD-10-CM | POA: Diagnosis not present

## 2017-01-14 DIAGNOSIS — D696 Thrombocytopenia, unspecified: Secondary | ICD-10-CM | POA: Diagnosis not present

## 2017-01-21 DIAGNOSIS — Z0000 Encounter for general adult medical examination without abnormal findings: Secondary | ICD-10-CM | POA: Diagnosis not present

## 2017-01-21 DIAGNOSIS — Z1212 Encounter for screening for malignant neoplasm of rectum: Secondary | ICD-10-CM | POA: Diagnosis not present

## 2017-01-21 DIAGNOSIS — R238 Other skin changes: Secondary | ICD-10-CM | POA: Diagnosis not present

## 2017-01-21 DIAGNOSIS — K5730 Diverticulosis of large intestine without perforation or abscess without bleeding: Secondary | ICD-10-CM | POA: Diagnosis not present

## 2017-01-21 DIAGNOSIS — M539 Dorsopathy, unspecified: Secondary | ICD-10-CM | POA: Diagnosis not present

## 2017-02-16 DIAGNOSIS — Z85828 Personal history of other malignant neoplasm of skin: Secondary | ICD-10-CM | POA: Diagnosis not present

## 2017-02-16 DIAGNOSIS — L718 Other rosacea: Secondary | ICD-10-CM | POA: Diagnosis not present

## 2017-02-16 DIAGNOSIS — D225 Melanocytic nevi of trunk: Secondary | ICD-10-CM | POA: Diagnosis not present

## 2017-02-16 DIAGNOSIS — L821 Other seborrheic keratosis: Secondary | ICD-10-CM | POA: Diagnosis not present

## 2017-02-17 ENCOUNTER — Ambulatory Visit: Payer: Medicare Other | Admitting: Hematology

## 2017-02-17 ENCOUNTER — Other Ambulatory Visit: Payer: Medicare Other

## 2017-02-18 NOTE — Progress Notes (Signed)
[name]  HEMATOLOGY ONCOLOGY PROGRESS NOTE  Date of service:  02/19/17   Patient Care Team: [name], MD as PCP - General (Internal Medicine)  CC:  F/u for CLL  Diagnosis: CLL  Current Treatment: Observation  INTERVAL HISTORY: Patient is here for scheduled follow-up of his recently diagnosed CLL. The patient's last visit with [place] was on 08/20/16. The pt reports that he is doing well overall and had a wonderful holiday season with his family. He reports feeling well. He denies having been on steroids since his last visit. He has stopped taking 1m aspirin 3 tims per week, per his PCP's advice.  Of note since the patient last visit, pt has had an Abdominal [place] 01/28/16 revealing Splenomegaly.  No focal splenic lesions evident. Parapelvic cyst right kidney measuring 5.1 x 3.5 x 6.3 cm. Portions of pancreas obscured by gas. Visualized portions of pancreas appear normal. Study otherwise unremarkable.  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  Lab results today (02/19/17) of CBC is as follows: all values are WNL except for WBC at 11.8k, Platelets at 101k, Lymphs Abs at 9.0k   On review of systems, pt reports no new symptoms and denies fevers, chills, night sweats, abdominal pains, bone pains, new lumps or bumps, leg swelling.   REVIEW OF SYSTEMS:    .10 Point review of Systems was done is negative except as noted above.   . Past Medical History:  Diagnosis Date  . Actinic keratoses   . Allergic rhinitis   . Atypical nevi   . Diverticulosis   . Enlarged prostate   . Glaucoma   . Glaucoma   . H/O degenerative disc disease   . Hx of adenomatous colonic polyps   . Malignant melanoma of skin of abdomen ([place] 2016  . Rotator cuff tendinitis   . Thrombocytopenia ([place]     . Past Surgical History:  Procedure Laterality Date  . HERNIA REPAIR    . Left inguinal herniorrhaphy    .  Melanoma on abdomen  2016    . Social History   Tobacco Use  . Smoking status: Never Smoker  . Smokeless tobacco: Never Used  Substance Use Topics  . Alcohol use: Yes    Alcohol/week: 1.8 oz    Types: 3 Glasses of wine per week  . Drug use: No    ALLERGIES:  has No Known Allergies.  MEDICATIONS:  Current Outpatient Medications  Medication Sig Dispense Refill  . aspirin 81 MG chewable tablet Chew 81 mg by mouth daily.    .[name] Kitchenlatanoprost (XALATAN) 0.005 % ophthalmic solution Place 1 drop into both eyes daily.  1  . Multiple Vitamin (MULTIVITAMIN) tablet Take 1 tablet by mouth daily.    . timolol (TIMOPTIC) 0.5 % ophthalmic solution Place 1 drop into both eyes 2 (two) times daily.   0   No current facility-administered medications for this visit.     PHYSICAL EXAMINATION: ECOG PERFORMANCE STATUS: 1 - Symptomatic but completely ambulatory  . Vitals:   02/19/17 1400  BP: 127/66  Pulse: (!) 57  Resp: 18  Temp: 97.8 F (36.6 C)  SpO2: 99%    Filed Weights   02/19/17 1400  Weight: 147 lb 8 oz (66.9 kg)   .Body mass index is 21.78 kg/m.  .[name] KitchenGENERAL:alert, in no acute distress and comfortable SKIN: no acute rashes, no significant lesions EYES: conjunctiva are pink and  non-injected, sclera anicteric OROPHARYNX: MMM, no exudates, no oropharyngeal erythema or ulceration NECK: supple, no JVD LYMPH:  no palpable lymphadenopathy in the cervical, axillary or inguinal regions LUNGS: clear to auscultation b/l with normal respiratory effort HEART: regular rate & rhythm  ABDOMEN:  normoactive bowel sounds , non tender, not distended. No palpable hepato-splenomegaly. Extremity: no pedal edema PSYCH: alert & oriented x 3 with fluent speech NEURO: no focal motor/sensory deficits    LABORATORY DATA:   I have reviewed the data as listed  . CBC Latest Ref Rng & Units 02/19/2017 08/20/2016 04/16/2016  WBC 4.0 - 10.3 K/uL 11.8(H) 15.7(H) 13.5(H)  Hemoglobin 13.0 - 17.1 g/dL -  13.5 14.0  Hematocrit 38.4 - 49.9 % 41.5 40.0 41.5  Platelets 140 - 400 K/uL 101(L) 104(L) 98(L)    . CMP Latest Ref Rng & Units 02/19/2017 08/20/2016 04/16/2016  Glucose 70 - 140 mg/dL 101 98 101  BUN 7 - 26 mg/dL 15 15.7 12.9  Creatinine 0.70 - 1.30 mg/dL 1.03 1.0 1.0  Sodium 136 - 145 mmol/L 139 141 141  Potassium 3.5 - 5.1 mmol/L 4.6 4.4 4.4  Chloride 98 - 109 mmol/L 103 - -  CO2 22 - 29 mmol/L 28 27 28   Calcium 8.4 - 10.4 mg/dL 9.3 9.5 9.7  Total Protein 6.4 - 8.3 g/dL 7.6 7.3 7.4  Total Bilirubin 0.2 - 1.2 mg/dL 0.9 0.97 1.04  Alkaline Phos 40 - 150 U/L 79 72 75  AST 5 - 34 U/L 20 19 21   ALT 0 - 55 U/L 11 12 15    [place] ABD (01/28/2016)  ABDOMEN ULTRASOUND COMPLETE  COMPARISON:  None.  FINDINGS: Gallbladder: No gallstones or wall thickening visualized. There is no pericholecystic fluid. No sonographic Murphy sign noted by sonographer.  Common bile duct: Diameter: 2 mm. There is no intrahepatic, common hepatic, or common bile duct dilatation.  Liver: No focal lesion identified. Within normal limits in parenchymal echogenicity.  IVC: No abnormality visualized.  Pancreas: Visualized portion unremarkable. Portions of pancreas obscured by gas.  Spleen: Spleen measures 16.0 x 14.2 x 5.8 cm with a measures splenic volume of 621 cubic cm. No focal splenic lesions are evident.  Right Kidney: Length: 11.4 cm. Echogenicity within normal limits. No hydronephrosis visualized. There is a parapelvic cyst in the left kidney measuring 5.1 x 3.5 x 6.3 cm.  Left Kidney: Length: 11.1 cm. Echogenicity within normal limits. No mass or hydronephrosis visualized.  Abdominal aorta: No aneurysm visualized.  Other findings: No demonstrable ascites.  IMPRESSION: Splenomegaly.  No focal splenic lesions evident.  Parapelvic cyst right kidney measuring 5.1 x 3.5 x 6.3 cm.  Portions of pancreas obscured by gas. Visualized portions of pancreas appear normal.  Study  otherwise unremarkable.   Electronically Signed   By: Lowella Grip III M.D.   On: 01/28/2016 14:09  FISH, CLL Prognostic on 08/26/16   RADIOGRAPHIC STUDIES: I have personally reviewed the radiological images as listed and agreed with the findings in the report. No results found.  ASSESSMENT & PLAN:   84 y/o male with  1) Rai Stage 2 Chronic lymphocytic leukemia. (with splenomegaly) Would be stage II if splenomegaly was considered to be related to the CLL. Patient however notes that he has had splenomegaly for decades without clear etiology noted previously.  WBC counts are not changed significantly. No anemia Mild thrombocytopenia that is not significantly changed platelets around 101k.  No constitutional symptoms. No issues with infections/bleeding. No clinically palpable lymphadenopathy ( US showed enlarged  spleen 16x14.2x5.8 cms) Plan -labs were reviewed and discussed with the patient. -he has no new clinical symptoms suggestive of CLL progression.  -No indications for treatment of his CLL at this time. -Discussed pt labwork today, his WBC have gone down to 11.8k from 15.7k. -Reviewed patient's genetics study, he has Trisomy 12 and a 13q deletion.  -We will continue to watch his platelets which have been stable at 101k.   2) Incidentally noted left parapelvic right kidney cyst measuring 5.1 x 3.5 x 6.3 cm in size. Plan -Would recommend CT scan of the abdomen and urology evaluation  by primary care physician.  3) . Patient Active Problem List   Diagnosis Date Noted  . CLL (chronic lymphocytic leukemia) ([place]) 01/21/2016  . Elevated WBC count 01/16/2016  . Glaucoma 01/16/2016  . Thrombocytopenia ([place]) 01/16/2016  . Diverticulosis 01/16/2016  . Encounter for colonoscopy due to history of adenomatous colonic polyps 01/16/2016  . Allergic rhinitis 01/16/2016   Plan -f/u with PCP for mx   RTC in 6 months with Dr Irene Limbo with labs  . The total time spent in the  appointment was 20 minutes and more than 50% was on counseling and direct patient cares.      Sullivan Lone MD [place] AAHIVMS [facility] Hematology/Oncology Physician [facility]  (Office):       [phone] (Work cell):  [phone] (Fax):           [phone]  This document serves as a record of services personally performed by Sullivan Lone, MD. It was created on his behalf by Baldwin Jamaica, a trained medical scribe. The creation of this record is based on the scribe's personal observations and the provider's statements to them.   .I have reviewed the above documentation for accuracy and completeness, and I agree with the above. Brunetta Genera MD MS

## 2017-02-19 ENCOUNTER — Inpatient Hospital Stay: Payer: Medicare Other | Attending: Hematology

## 2017-02-19 ENCOUNTER — Inpatient Hospital Stay (HOSPITAL_BASED_OUTPATIENT_CLINIC_OR_DEPARTMENT_OTHER): Payer: Medicare Other | Admitting: Hematology

## 2017-02-19 ENCOUNTER — Other Ambulatory Visit: Payer: Self-pay

## 2017-02-19 ENCOUNTER — Telehealth: Payer: Self-pay | Admitting: Hematology

## 2017-02-19 ENCOUNTER — Encounter: Payer: Self-pay | Admitting: Hematology

## 2017-02-19 VITALS — BP 127/66 | HR 57 | Temp 97.8°F | Resp 18 | Ht 69.0 in | Wt 147.5 lb

## 2017-02-19 DIAGNOSIS — D696 Thrombocytopenia, unspecified: Secondary | ICD-10-CM

## 2017-02-19 DIAGNOSIS — N281 Cyst of kidney, acquired: Secondary | ICD-10-CM | POA: Diagnosis not present

## 2017-02-19 DIAGNOSIS — C9110 Chronic lymphocytic leukemia of B-cell type not having achieved remission: Secondary | ICD-10-CM | POA: Diagnosis not present

## 2017-02-19 DIAGNOSIS — R161 Splenomegaly, not elsewhere classified: Secondary | ICD-10-CM

## 2017-02-19 LAB — CBC WITH DIFFERENTIAL (CANCER CENTER ONLY)
BASOS PCT: 0 %
Basophils Absolute: 0 10*3/uL (ref 0.0–0.1)
EOS ABS: 0.1 10*3/uL (ref 0.0–0.5)
EOS PCT: 1 %
HEMATOCRIT: 41.5 % (ref 38.4–49.9)
Hemoglobin: 13.8 g/dL (ref 13.0–17.1)
Lymphocytes Relative: 76 %
Lymphs Abs: 9 10*3/uL — ABNORMAL HIGH (ref 0.9–3.3)
MCH: 31.8 pg (ref 27.2–33.4)
MCHC: 33.3 g/dL (ref 32.0–36.0)
MCV: 95.6 fL (ref 79.3–98.0)
MONO ABS: 0.4 10*3/uL (ref 0.1–0.9)
MONOS PCT: 3 %
NEUTROS ABS: 2.3 10*3/uL (ref 1.5–6.5)
Neutrophils Relative %: 20 %
PLATELETS: 101 10*3/uL — AB (ref 140–400)
RBC: 4.34 MIL/uL (ref 4.20–5.82)
RDW: 13.8 % (ref 11.0–14.6)
WBC: 11.8 10*3/uL — AB (ref 4.0–10.3)

## 2017-02-19 LAB — CMP (CANCER CENTER ONLY)
ALBUMIN: 4.2 g/dL (ref 3.5–5.0)
ALT: 11 U/L (ref 0–55)
ANION GAP: 8 (ref 3–11)
AST: 20 U/L (ref 5–34)
Alkaline Phosphatase: 79 U/L (ref 40–150)
BILIRUBIN TOTAL: 0.9 mg/dL (ref 0.2–1.2)
BUN: 15 mg/dL (ref 7–26)
CHLORIDE: 103 mmol/L (ref 98–109)
CO2: 28 mmol/L (ref 22–29)
Calcium: 9.3 mg/dL (ref 8.4–10.4)
Creatinine: 1.03 mg/dL (ref 0.70–1.30)
GFR, Est AFR Am: >60 mL/min (ref >60)
GFR, Estimated: >60 mL/min (ref >60)
GLUCOSE: 101 mg/dL (ref 70–140)
POTASSIUM: 4.6 mmol/L (ref 3.5–5.1)
SODIUM: 139 mmol/L (ref 136–145)
TOTAL PROTEIN: 7.6 g/dL (ref 6.4–8.3)

## 2017-02-19 LAB — LACTATE DEHYDROGENASE: LDH: 216 U/L (ref 125–245)

## 2017-02-19 NOTE — Telephone Encounter (Signed)
Scheduled appt per 2/14 los - Gave patient AVS and calender per los.  

## 2017-02-24 DIAGNOSIS — H401133 Primary open-angle glaucoma, bilateral, severe stage: Secondary | ICD-10-CM | POA: Diagnosis not present

## 2017-02-24 DIAGNOSIS — H35372 Puckering of macula, left eye: Secondary | ICD-10-CM | POA: Diagnosis not present

## 2017-08-17 NOTE — Progress Notes (Signed)
Brandon Norris  HEMATOLOGY ONCOLOGY PROGRESS NOTE  Date of service:  08/18/17   Patient Care Team: [name], MD as PCP - General (Internal Medicine)  Chief Complaint:  F/u for CLL  Diagnosis: CLL  Current Treatment: Observation  INTERVAL HISTORY: Brandon Norris returns today for management and evaluation of his CLL. The patient's last visit with [place] was on 02/19/17. The pt reports that he is doing very well overall.   The pt reports that he has not developed any new concerns and is able to do everything he wants to do. He notes that he has been staying active, walks every morning, and continues to work. The pt denies taking any steroids or prednisone in the interim and denies any concerns for infections. He has not developed any constititonal symptoms.   Lab results today (08/18/17) of CBC w/diff, CMP is as follows: all values are WNL except for WBC at 14.6k, RBC at 3.93, HGB at 12.5, HCT at 37.8, PLT at 94k, Lymphs abs at 10.6k, Monocytes abs at 1.1k. LDH 08/18/17 is at 212  On review of systems, pt reports staying active, good energy levels, eating well, and denies fevers, chills, night sweats, unexpected weight loss, abdominal pain, leg swelling, noticing any new lumps or bumps.   REVIEW OF SYSTEMS:   A 10+ POINT REVIEW OF SYSTEMS WAS OBTAINED including neurology, dermatology, psychiatry, cardiac, respiratory, lymph, extremities, GI, GU, Musculoskeletal, constitutional, breasts, reproductive, HEENT.  All pertinent positives are noted in the HPI.  All others are negative.  . Past Medical History:  Diagnosis Date  . Actinic keratoses   . Allergic rhinitis   . Atypical nevi   . Diverticulosis   . Enlarged prostate   . Glaucoma   . Glaucoma   . H/O degenerative disc disease   . Hx of adenomatous colonic polyps   . Malignant melanoma of skin of abdomen ([place]) 2016  . Rotator cuff tendinitis   . Thrombocytopenia ([place])     . Past Surgical History:  Procedure Laterality Date  .  HERNIA REPAIR    . Left inguinal herniorrhaphy    . Melanoma on abdomen  2016    . Social History   Tobacco Use  . Smoking status: Never Smoker  . Smokeless tobacco: Never Used  Substance Use Topics  . Alcohol use: Yes    Alcohol/week: 3.0 standard drinks    Types: 3 Glasses of wine per week  . Drug use: No    ALLERGIES:  has No Known Allergies.  MEDICATIONS:  Current Outpatient Medications  Medication Sig Dispense Refill  . latanoprost (XALATAN) 0.005 % ophthalmic solution Place 1 drop into both eyes daily.  1  . Multiple Vitamin (MULTIVITAMIN) tablet Take 1 tablet by mouth daily.    . timolol (TIMOPTIC) 0.5 % ophthalmic solution Place 1 drop into both eyes 2 (two) times daily.   0   No current facility-administered medications for this visit.     PHYSICAL EXAMINATION: ECOG PERFORMANCE STATUS: 1 - Symptomatic but completely ambulatory  Vitals:   08/18/17 1443  BP: 117/67  Pulse: (!) 57  Resp: 17  Temp: 98.3 F (36.8 C)  SpO2: 100%    Filed Weights   08/18/17 1443  Weight: 144 lb 12.8 oz (65.7 kg)   .Body mass index is 21.38 kg/m.  GENERAL:alert, in no acute distress and comfortable SKIN: no acute rashes, no significant lesions EYES: conjunctiva are pink and non-injected, sclera anicteric OROPHARYNX: MMM, no exudates, no oropharyngeal erythema or ulceration NECK:  supple, no JVD LYMPH:  no palpable lymphadenopathy in the cervical, axillary or inguinal regions LUNGS: clear to auscultation b/l with normal respiratory effort HEART: regular rate & rhythm ABDOMEN:  normoactive bowel sounds , non tender, not distended. No palpable hepatosplenomegaly.  Extremity: no pedal edema PSYCH: alert & oriented x 3 with fluent speech NEURO: no focal motor/sensory deficits   LABORATORY DATA:   I have reviewed the data as listed  . CBC Latest Ref Rng & Units 08/18/2017 02/19/2017 08/20/2016  WBC 4.0 - 10.3 K/uL 14.6(H) 11.8(H) 15.7(H)  Hemoglobin 13.0 - 17.1 g/dL  12.5(L) 13.8 13.5  Hematocrit 38.4 - 49.9 % 37.8(L) 41.5 40.0  Platelets 140 - 400 K/uL 94(L) 101(L) 104(L)    . CMP Latest Ref Rng & Units 08/18/2017 02/19/2017 08/20/2016  Glucose 70 - 99 mg/dL 94 101 98  BUN 8 - 23 mg/dL 14 15 15.7  Creatinine 0.61 - 1.24 mg/dL 0.96 1.03 1.0  Sodium 135 - 145 mmol/L 140 139 141  Potassium 3.5 - 5.1 mmol/L 4.3 4.6 4.4  Chloride 98 - 111 mmol/L 105 103 -  CO2 22 - 32 mmol/L 25 28 27   Calcium 8.9 - 10.3 mg/dL 9.0 9.3 9.5  Total Protein 6.5 - 8.1 g/dL 7.2 7.6 7.3  Total Bilirubin 0.3 - 1.2 mg/dL 0.9 0.9 0.97  Alkaline Phos 38 - 126 U/L 73 79 72  AST 15 - 41 U/L 19 20 19   ALT 0 - 44 U/L 13 11 12    [place] ABD (01/28/2016)  ABDOMEN ULTRASOUND COMPLETE  COMPARISON:  None.  FINDINGS: Gallbladder: No gallstones or wall thickening visualized. There is no pericholecystic fluid. No sonographic Murphy sign noted by sonographer.  Common bile duct: Diameter: 2 mm. There is no intrahepatic, common hepatic, or common bile duct dilatation.  Liver: No focal lesion identified. Within normal limits in parenchymal echogenicity.  IVC: No abnormality visualized.  Pancreas: Visualized portion unremarkable. Portions of pancreas obscured by gas.  Spleen: Spleen measures 16.0 x 14.2 x 5.8 cm with a measures splenic volume of 621 cubic cm. No focal splenic lesions are evident.  Right Kidney: Length: 11.4 cm. Echogenicity within normal limits. No hydronephrosis visualized. There is a parapelvic cyst in the left kidney measuring 5.1 x 3.5 x 6.3 cm.  Left Kidney: Length: 11.1 cm. Echogenicity within normal limits. No mass or hydronephrosis visualized.  Abdominal aorta: No aneurysm visualized.  Other findings: No demonstrable ascites.  IMPRESSION: Splenomegaly.  No focal splenic lesions evident.  Parapelvic cyst right kidney measuring 5.1 x 3.5 x 6.3 cm.  Portions of pancreas obscured by gas. Visualized portions of pancreas appear  normal.  Study otherwise unremarkable.   Electronically Signed   By: Lowella Grip III M.D.   On: 01/28/2016 14:09  FISH, CLL Prognostic on 08/26/16   RADIOGRAPHIC STUDIES: I have personally reviewed the radiological images as listed and agreed with the findings in the report. No results found.  ASSESSMENT & PLAN:   84 y/o male with  1) Rai Stage 2 Chronic lymphocytic leukemia. (with splenomegaly) Trisomy 12 and a 13q deletion.  Would be stage II if splenomegaly was considered to be related to the CLL. Patient however notes that he has had splenomegaly for decades without clear etiology noted previously.  WBC counts are not changed significantly. No anemia Mild thrombocytopenia that is not significantly changed platelets around 101k.  No constitutional symptoms. No issues with infections/bleeding. No clinically palpable lymphadenopathy ( US showed enlarged spleen 16x14.2x5.8 cms)  PLAN:  -Discussed  pt labwork today, 08/18/17; patient's Lymphs have increased from 9.0k to 10.6k over a 6 month period of time. PLT stable at 94k. HGB slightly decreased to 12.5. LDH stable at 212.  -Discussed that if anemia or thrombocytopenia worsens, would consider initiating treatment -No indication for initiating active treatment at this time.   -Will see pt back in 6 months, sooner if the pt develops any new concerns  2) Incidentally noted left parapelvic right kidney cyst measuring 5.1 x 3.5 x 6.3 cm in size. Plan -Would recommend CT scan of the abdomen and urology evaluation  by primary care physician.  3) . Patient Active Problem List   Diagnosis Date Noted  . CLL (chronic lymphocytic leukemia) ([place]) 01/21/2016  . Elevated WBC count 01/16/2016  . Glaucoma 01/16/2016  . Thrombocytopenia ([place]) 01/16/2016  . Diverticulosis 01/16/2016  . Encounter for colonoscopy due to history of adenomatous colonic polyps 01/16/2016  . Allergic rhinitis 01/16/2016   Plan -f/u with PCP for  mx   RTC with Dr Irene Limbo in 6 months with labs   The total time spent in the appt was 20 minutes and more than 50% was on counseling and direct patient cares.       Sullivan Lone MD [place] AAHIVMS [facility] Hematology/Oncology Physician [facility]  (Office):       [phone] (Work cell):  [phone] (Fax):           [phone]  I, Baldwin Jamaica, am acting as a scribe for Dr. Irene Limbo  .I have reviewed the above documentation for accuracy and completeness, and I agree with the above. Brunetta Genera MD

## 2017-08-18 ENCOUNTER — Inpatient Hospital Stay: Payer: Medicare Other | Attending: Hematology | Admitting: Hematology

## 2017-08-18 ENCOUNTER — Telehealth: Payer: Self-pay | Admitting: Oncology

## 2017-08-18 ENCOUNTER — Encounter: Payer: Self-pay | Admitting: Hematology

## 2017-08-18 ENCOUNTER — Inpatient Hospital Stay: Payer: Medicare Other

## 2017-08-18 VITALS — BP 117/67 | HR 57 | Temp 98.3°F | Resp 17 | Ht 69.0 in | Wt 144.8 lb

## 2017-08-18 DIAGNOSIS — C9190 Lymphoid leukemia, unspecified not having achieved remission: Secondary | ICD-10-CM

## 2017-08-18 DIAGNOSIS — N281 Cyst of kidney, acquired: Secondary | ICD-10-CM | POA: Diagnosis not present

## 2017-08-18 DIAGNOSIS — C9110 Chronic lymphocytic leukemia of B-cell type not having achieved remission: Secondary | ICD-10-CM | POA: Diagnosis not present

## 2017-08-18 DIAGNOSIS — D696 Thrombocytopenia, unspecified: Secondary | ICD-10-CM | POA: Diagnosis not present

## 2017-08-18 LAB — CBC WITH DIFFERENTIAL (CANCER CENTER ONLY)
BASOS PCT: 0 %
Basophils Absolute: 0 10*3/uL (ref 0.0–0.1)
EOS PCT: 1 %
Eosinophils Absolute: 0.1 10*3/uL (ref 0.0–0.5)
HEMATOCRIT: 37.8 % — AB (ref 38.4–49.9)
Hemoglobin: 12.5 g/dL — ABNORMAL LOW (ref 13.0–17.1)
LYMPHS PCT: 72 %
Lymphs Abs: 10.6 10*3/uL — ABNORMAL HIGH (ref 0.9–3.3)
MCH: 31.8 pg (ref 27.2–33.4)
MCHC: 33.1 g/dL (ref 32.0–36.0)
MCV: 96.2 fL (ref 79.3–98.0)
MONO ABS: 1.1 10*3/uL — AB (ref 0.1–0.9)
Monocytes Relative: 7 %
NEUTROS ABS: 2.9 10*3/uL (ref 1.5–6.5)
Neutrophils Relative %: 20 %
PLATELETS: 94 10*3/uL — AB (ref 140–400)
RBC: 3.93 MIL/uL — AB (ref 4.20–5.82)
RDW: 14.1 % (ref 11.0–14.6)
WBC: 14.6 10*3/uL — AB (ref 4.0–10.3)

## 2017-08-18 LAB — CMP (CANCER CENTER ONLY)
ALT: 13 U/L (ref 0–44)
AST: 19 U/L (ref 15–41)
Albumin: 4 g/dL (ref 3.5–5.0)
Alkaline Phosphatase: 73 U/L (ref 38–126)
Anion gap: 10 (ref 5–15)
BUN: 14 mg/dL (ref 8–23)
CHLORIDE: 105 mmol/L (ref 98–111)
CO2: 25 mmol/L (ref 22–32)
Calcium: 9 mg/dL (ref 8.9–10.3)
Creatinine: 0.96 mg/dL (ref 0.61–1.24)
Glucose, Bld: 94 mg/dL (ref 70–99)
POTASSIUM: 4.3 mmol/L (ref 3.5–5.1)
Sodium: 140 mmol/L (ref 135–145)
Total Bilirubin: 0.9 mg/dL (ref 0.3–1.2)
Total Protein: 7.2 g/dL (ref 6.5–8.1)

## 2017-08-18 LAB — LACTATE DEHYDROGENASE: LDH: 212 U/L — AB (ref 98–192)

## 2017-08-18 NOTE — Telephone Encounter (Signed)
Appts scheduled AVS/Calendar printed per 8/13 los °

## 2017-08-25 DIAGNOSIS — H401133 Primary open-angle glaucoma, bilateral, severe stage: Secondary | ICD-10-CM | POA: Diagnosis not present

## 2017-08-25 DIAGNOSIS — H2513 Age-related nuclear cataract, bilateral: Secondary | ICD-10-CM | POA: Diagnosis not present

## 2018-01-21 DIAGNOSIS — D696 Thrombocytopenia, unspecified: Secondary | ICD-10-CM | POA: Diagnosis not present

## 2018-01-21 DIAGNOSIS — Z7982 Long term (current) use of aspirin: Secondary | ICD-10-CM | POA: Diagnosis not present

## 2018-01-26 DIAGNOSIS — R161 Splenomegaly, not elsewhere classified: Secondary | ICD-10-CM | POA: Diagnosis not present

## 2018-01-26 DIAGNOSIS — Z1212 Encounter for screening for malignant neoplasm of rectum: Secondary | ICD-10-CM | POA: Diagnosis not present

## 2018-01-26 DIAGNOSIS — D696 Thrombocytopenia, unspecified: Secondary | ICD-10-CM | POA: Diagnosis not present

## 2018-01-26 DIAGNOSIS — Z0001 Encounter for general adult medical examination with abnormal findings: Secondary | ICD-10-CM | POA: Diagnosis not present

## 2018-01-26 DIAGNOSIS — C9110 Chronic lymphocytic leukemia of B-cell type not having achieved remission: Secondary | ICD-10-CM | POA: Diagnosis not present

## 2018-02-15 NOTE — Progress Notes (Signed)
[name]  HEMATOLOGY ONCOLOGY PROGRESS NOTE  Date of service:  02/16/18   Patient Care Team: [name], MD as PCP - General (Internal Medicine)  Chief Complaint:  F/u for CLL  Diagnosis: CLL  Current Treatment: Observation  INTERVAL HISTORY: Brandon Norris returns today for management and evaluation of his CLL. The patient's last visit with [place] was on 08/18/17. The pt reports that he is doing well overall.   The pt reports that he has not developed any new concerns since our last visit. He notes that he continues eating well and endorses stable energy levels and stable weight. He denies any fevers, chills, night sweats, or noticing any new lumps or bumps.   The pt notes that he has been referred to urology and will establish care with Dr. Tresa Moore next month.  Lab results today (02/16/18) of CBC w/diff and CMP is as follows: all values are WNL except for WBC at 13.3k, RBC at 3.98, HGB at 12.7, HCT at 38.9, PLT at 111k, Lymphs abs at 11.2k. 02/16/18 LDH at 208  On review of systems, pt reports stable energy levels, stable weight, eating well, and denies fevers, chills, night sweats, noticing any new lumps or bumps, back pain, abdominal pains, leg swelling, and any other symptoms.  REVIEW OF SYSTEMS:   A 10+ POINT REVIEW OF SYSTEMS WAS OBTAINED including neurology, dermatology, psychiatry, cardiac, respiratory, lymph, extremities, GI, GU, Musculoskeletal, constitutional, breasts, reproductive, HEENT.  All pertinent positives are noted in the HPI.  All others are negative.  . Past Medical History:  Diagnosis Date  . Actinic keratoses   . Allergic rhinitis   . Atypical nevi   . Diverticulosis   . Enlarged prostate   . Glaucoma   . Glaucoma   . H/O degenerative disc disease   . Hx of adenomatous colonic polyps   . Malignant melanoma of skin of abdomen ([place]) 2016  . Rotator cuff tendinitis   . Thrombocytopenia ([place])     . Past Surgical History:  Procedure Laterality Date  .  HERNIA REPAIR    . Left inguinal herniorrhaphy    . Melanoma on abdomen  2016    . Social History   Tobacco Use  . Smoking status: Never Smoker  . Smokeless tobacco: Never Used  Substance Use Topics  . Alcohol use: Yes    Alcohol/week: 3.0 standard drinks    Types: 3 Glasses of wine per week  . Drug use: No    ALLERGIES:  has No Known Allergies.  MEDICATIONS:  Current Outpatient Medications  Medication Sig Dispense Refill  . latanoprost (XALATAN) 0.005 % ophthalmic solution Place 1 drop into both eyes daily.  1  . Multiple Vitamin (MULTIVITAMIN) tablet Take 1 tablet by mouth daily.    . timolol (TIMOPTIC) 0.5 % ophthalmic solution Place 1 drop into both eyes 2 (two) times daily.   0   No current facility-administered medications for this visit.     PHYSICAL EXAMINATION: ECOG PERFORMANCE STATUS: 1 - Symptomatic but completely ambulatory  Vitals:   02/16/18 1410  BP: 117/72  Pulse: 62  Resp: 17  Temp: 98.7 F (37.1 C)  SpO2: 100%    There were no vitals filed for this visit. .Body mass index is 23.02 kg/m.  GENERAL:alert, in no acute distress and comfortable SKIN: no acute rashes, no significant lesions EYES: conjunctiva are pink and non-injected, sclera anicteric OROPHARYNX: MMM, no exudates, no oropharyngeal erythema or ulceration NECK: supple, no JVD LYMPH:  no palpable lymphadenopathy  in the cervical, axillary or inguinal regions LUNGS: clear to auscultation b/l with normal respiratory effort HEART: regular rate & rhythm ABDOMEN:  normoactive bowel sounds , non tender, not distended. No palpable hepatosplenomegaly.  Extremity: no pedal edema PSYCH: alert & oriented x 3 with fluent speech NEURO: no focal motor/sensory deficits   LABORATORY DATA:   I have reviewed the data as listed  . CBC Latest Ref Rng & Units 02/16/2018 08/18/2017 02/19/2017  WBC 4.0 - 10.5 K/uL 13.3(H) 14.6(H) 11.8(H)  Hemoglobin 13.0 - 17.0 g/dL 12.7(L) 12.5(L) 13.8  Hematocrit  39.0 - 52.0 % 38.9(L) 37.8(L) 41.5  Platelets 150 - 400 K/uL 111(L) 94(L) 101(L)    . CMP Latest Ref Rng & Units 02/16/2018 08/18/2017 02/19/2017  Glucose 70 - 99 mg/dL 99 94 101  BUN 8 - 23 mg/dL 14 14 15   Creatinine 0.61 - 1.24 mg/dL 1.03 0.96 1.03  Sodium 135 - 145 mmol/L 140 140 139  Potassium 3.5 - 5.1 mmol/L 4.3 4.3 4.6  Chloride 98 - 111 mmol/L 105 105 103  CO2 22 - 32 mmol/L 28 25 28   Calcium 8.9 - 10.3 mg/dL 9.2 9.0 9.3  Total Protein 6.5 - 8.1 g/dL 7.3 7.2 7.6  Total Bilirubin 0.3 - 1.2 mg/dL 0.8 0.9 0.9  Alkaline Phos 38 - 126 U/L 75 73 79  AST 15 - 41 U/L 17 19 20   ALT 0 - 44 U/L 8 13 11    [place] ABD (01/28/2016)  ABDOMEN ULTRASOUND COMPLETE  COMPARISON:  None.  FINDINGS: Gallbladder: No gallstones or wall thickening visualized. There is no pericholecystic fluid. No sonographic Murphy sign noted by sonographer.  Common bile duct: Diameter: 2 mm. There is no intrahepatic, common hepatic, or common bile duct dilatation.  Liver: No focal lesion identified. Within normal limits in parenchymal echogenicity.  IVC: No abnormality visualized.  Pancreas: Visualized portion unremarkable. Portions of pancreas obscured by gas.  Spleen: Spleen measures 16.0 x 14.2 x 5.8 cm with a measures splenic volume of 621 cubic cm. No focal splenic lesions are evident.  Right Kidney: Length: 11.4 cm. Echogenicity within normal limits. No hydronephrosis visualized. There is a parapelvic cyst in the left kidney measuring 5.1 x 3.5 x 6.3 cm.  Left Kidney: Length: 11.1 cm. Echogenicity within normal limits. No mass or hydronephrosis visualized.  Abdominal aorta: No aneurysm visualized.  Other findings: No demonstrable ascites.  IMPRESSION: Splenomegaly.  No focal splenic lesions evident.  Parapelvic cyst right kidney measuring 5.1 x 3.5 x 6.3 cm.  Portions of pancreas obscured by gas. Visualized portions of pancreas appear normal.  Study otherwise  unremarkable.   Electronically Signed   By: Lowella Grip III M.D.   On: 01/28/2016 14:09  FISH, CLL Prognostic on 08/26/16   RADIOGRAPHIC STUDIES: I have personally reviewed the radiological images as listed and agreed with the findings in the report. No results found.  ASSESSMENT & PLAN:   85 y.o. male with  1) Rai Stage 2 Chronic lymphocytic leukemia. (with splenomegaly) Trisomy 12 and a 13q deletion.  Would be stage II if splenomegaly was considered to be related to the CLL. Patient however notes that he has had splenomegaly for decades without clear etiology noted previously.  WBC counts are not changed significantly. No anemia Mild thrombocytopenia that is not significantly changed platelets around 101k.  No constitutional symptoms. No issues with infections/bleeding. No clinically palpable lymphadenopathy ( US showed enlarged spleen 16x14.2x5.8 cms)  PLAN: -Discussed pt labwork today, 02/16/18; WBC decreased from 14.6k to  13.3k over the past 6 months. HGB stable at 12.7. PLT improved to 111k. Chemistries normal. LDH stable at 208 -The pt shows no overt clinical or lab progression of his CLL at this time.  -No indication to initiate active treatment at this time.   -Pt will be mindful to look for constitutional symptoms and will let me know if he develops any of these -Discussed that if anemia or thrombocytopenia worsens, would consider initiating treatment -Will see the pt back in 6 months  2) Incidentally noted left parapelvic right kidney cyst measuring 5.1 x 3.5 x 6.3 cm in size. Plan -Previously recommend CT scan of the abdomen and urology evaluation  by primary care physician. -Pt notes he will be establishing care with Dr. Tresa Moore in urology in March 2020   3) . Patient Active Problem List   Diagnosis Date Noted  . CLL (chronic lymphocytic leukemia) ([place]) 01/21/2016  . Elevated WBC count 01/16/2016  . Glaucoma 01/16/2016  . Thrombocytopenia ([place])  01/16/2016  . Diverticulosis 01/16/2016  . Encounter for colonoscopy due to history of adenomatous colonic polyps 01/16/2016  . Allergic rhinitis 01/16/2016   Plan -f/u with PCP for mx   RTC with Dr Irene Limbo with labs in 6 months   The total time spent in the appt was 20 minutes and more than 50% was on counseling and direct patient cares.     Sullivan Lone MD [place] AAHIVMS [facility] Hematology/Oncology Physician [facility]  (Office):       [phone] (Work cell):  [phone] (Fax):           [phone]  I, [place], am acting as a scribe for Dr. Sullivan Lone.   .I have reviewed the above documentation for accuracy and completeness, and I agree with the above. Brunetta Genera MD

## 2018-02-16 ENCOUNTER — Inpatient Hospital Stay: Payer: Medicare Other | Attending: Hematology

## 2018-02-16 ENCOUNTER — Telehealth: Payer: Self-pay | Admitting: Hematology

## 2018-02-16 ENCOUNTER — Inpatient Hospital Stay (HOSPITAL_BASED_OUTPATIENT_CLINIC_OR_DEPARTMENT_OTHER): Payer: Medicare Other | Admitting: Hematology

## 2018-02-16 VITALS — BP 117/72 | HR 62 | Temp 98.7°F | Resp 17 | Ht 66.5 in

## 2018-02-16 DIAGNOSIS — R161 Splenomegaly, not elsewhere classified: Secondary | ICD-10-CM | POA: Diagnosis not present

## 2018-02-16 DIAGNOSIS — N281 Cyst of kidney, acquired: Secondary | ICD-10-CM | POA: Diagnosis not present

## 2018-02-16 DIAGNOSIS — C9110 Chronic lymphocytic leukemia of B-cell type not having achieved remission: Secondary | ICD-10-CM

## 2018-02-16 LAB — CMP (CANCER CENTER ONLY)
ALBUMIN: 4 g/dL (ref 3.5–5.0)
ALT: 8 U/L (ref 0–44)
ANION GAP: 7 (ref 5–15)
AST: 17 U/L (ref 15–41)
Alkaline Phosphatase: 75 U/L (ref 38–126)
BUN: 14 mg/dL (ref 8–23)
CHLORIDE: 105 mmol/L (ref 98–111)
CO2: 28 mmol/L (ref 22–32)
Calcium: 9.2 mg/dL (ref 8.9–10.3)
Creatinine: 1.03 mg/dL (ref 0.61–1.24)
GFR, Estimated: >60 mL/min (ref >60)
GLUCOSE: 99 mg/dL (ref 70–99)
POTASSIUM: 4.3 mmol/L (ref 3.5–5.1)
SODIUM: 140 mmol/L (ref 135–145)
Total Bilirubin: 0.8 mg/dL (ref 0.3–1.2)
Total Protein: 7.3 g/dL (ref 6.5–8.1)

## 2018-02-16 LAB — CBC WITH DIFFERENTIAL/PLATELET
ABS IMMATURE GRANULOCYTES: 0 10*3/uL (ref 0.00–0.07)
Basophils Absolute: 0 10*3/uL (ref 0.0–0.1)
Basophils Relative: 0 %
EOS ABS: 0.1 10*3/uL (ref 0.0–0.5)
EOS PCT: 1 %
HEMATOCRIT: 38.9 % — AB (ref 39.0–52.0)
Hemoglobin: 12.7 g/dL — ABNORMAL LOW (ref 13.0–17.0)
LYMPHS ABS: 11.2 10*3/uL — AB (ref 0.7–4.0)
LYMPHS PCT: 84 %
MCH: 31.9 pg (ref 26.0–34.0)
MCHC: 32.6 g/dL (ref 30.0–36.0)
MCV: 97.7 fL (ref 80.0–100.0)
MONOS PCT: 1 %
Monocytes Absolute: 0.1 10*3/uL (ref 0.1–1.0)
NEUTROS ABS: 1.9 10*3/uL (ref 1.7–17.7)
NEUTROS PCT: 14 %
NRBC: 0 % (ref 0.0–0.2)
PLATELETS: 111 10*3/uL — AB (ref 150–400)
RBC: 3.98 MIL/uL — ABNORMAL LOW (ref 4.22–5.81)
RDW: 13.5 % (ref 11.5–15.5)
WBC: 13.3 10*3/uL — AB (ref 4.0–10.5)

## 2018-02-16 LAB — RETICULOCYTES
IMMATURE RETIC FRACT: 7.6 % (ref 2.3–15.9)
RBC.: 3.98 MIL/uL — ABNORMAL LOW (ref 4.22–5.81)
RETIC CT PCT: 1.8 % (ref 0.4–3.1)
Retic Count, Absolute: 71.2 10*3/uL (ref 19.0–186.0)

## 2018-02-16 LAB — LACTATE DEHYDROGENASE: LDH: 208 U/L — AB (ref 98–192)

## 2018-02-16 NOTE — Telephone Encounter (Signed)
Scheduled appt per 02/11 los. ° °Printed calendar and avs. °

## 2018-03-02 DIAGNOSIS — H401133 Primary open-angle glaucoma, bilateral, severe stage: Secondary | ICD-10-CM | POA: Diagnosis not present

## 2018-03-02 DIAGNOSIS — H25013 Cortical age-related cataract, bilateral: Secondary | ICD-10-CM | POA: Diagnosis not present

## 2018-03-10 DIAGNOSIS — L821 Other seborrheic keratosis: Secondary | ICD-10-CM | POA: Diagnosis not present

## 2018-03-10 DIAGNOSIS — D1801 Hemangioma of skin and subcutaneous tissue: Secondary | ICD-10-CM | POA: Diagnosis not present

## 2018-03-10 DIAGNOSIS — Z85828 Personal history of other malignant neoplasm of skin: Secondary | ICD-10-CM | POA: Diagnosis not present

## 2018-03-10 DIAGNOSIS — D225 Melanocytic nevi of trunk: Secondary | ICD-10-CM | POA: Diagnosis not present

## 2018-03-16 DIAGNOSIS — N281 Cyst of kidney, acquired: Secondary | ICD-10-CM | POA: Diagnosis not present

## 2018-05-03 DIAGNOSIS — N281 Cyst of kidney, acquired: Secondary | ICD-10-CM | POA: Diagnosis not present

## 2018-08-16 NOTE — Progress Notes (Signed)
[name]  HEMATOLOGY ONCOLOGY PROGRESS NOTE  Date of service:  08/17/18   Patient Care Team: [name], MD as PCP - General (Internal Medicine)  Chief Complaint:  F/u for CLL  Diagnosis: CLL  Current Treatment: Observation  INTERVAL HISTORY:  Brandon Norris returns today for management and evaluation of his CLL. The patient's last visit with Brandon Norris was on 02/16/2018. The pt reports that he is doing well overall.  The pt reports some weight loss. He has not changed his eating habits at all in the past couple of years. Denies belly pain fevers, problems passing urine, chills, and night sweats. The pt has been staying inside and avoiding crowds. He walks 10-15 minutes daily for exercise.   Lab results today (08/17/2018) of CBC w/diff and CMP is as follows: all values are WNL except for WBC at 16.8, RBC at 3.91, HGB at 12.5, HCT at 38.4, PLT at 97k 08/17/2018 CMP is wnl 08/17/2018 lactate dehydrogenase is 196  On review of systems, pt reports some weight loss and denies problems passing urine, chills, fevers, night sweats, belly pain and any other symptoms.   REVIEW OF SYSTEMS:    A 10+ POINT REVIEW OF SYSTEMS WAS OBTAINED including neurology, dermatology, psychiatry, cardiac, respiratory, lymph, extremities, GI, GU, Musculoskeletal, constitutional, breasts, reproductive, HEENT.  All pertinent positives are noted in the HPI.  All others are negative.  . Past Medical History:  Diagnosis Date  . Actinic keratoses   . Allergic rhinitis   . Atypical nevi   . Diverticulosis   . Enlarged prostate   . Glaucoma   . Glaucoma   . H/O degenerative disc disease   . Hx of adenomatous colonic polyps   . Malignant melanoma of skin of abdomen (Brandon Norris) 2016  . Rotator cuff tendinitis   . Thrombocytopenia ([place])     . Past Surgical History:  Procedure Laterality Date  . HERNIA REPAIR    . Left inguinal herniorrhaphy    . Melanoma on abdomen  2016    . Social History   Tobacco Use  .  Smoking status: Never Smoker  . Smokeless tobacco: Never Used  Substance Use Topics  . Alcohol use: Yes    Alcohol/week: 3.0 standard drinks    Types: 3 Glasses of wine per week  . Drug use: No    ALLERGIES:  has No Known Allergies.  MEDICATIONS:  Current Outpatient Medications  Medication Sig Dispense Refill  . latanoprost (XALATAN) 0.005 % ophthalmic solution Place 1 drop into both eyes daily.  1  . Multiple Vitamin (MULTIVITAMIN) tablet Take 1 tablet by mouth daily.    . timolol (TIMOPTIC) 0.5 % ophthalmic solution Place 1 drop into both eyes 2 (two) times daily.   0   No current facility-administered medications for this visit.     PHYSICAL EXAMINATION: ECOG PERFORMANCE STATUS: 1 - Symptomatic but completely ambulatory  Vitals:   08/17/18 1400  BP: 118/71  Pulse: (!) 55  Resp: 18  Temp: 98 F (36.7 C)  SpO2: 99%    Filed Weights   08/17/18 1400  Weight: 139 lb 9.6 oz (63.3 kg)   .Body mass index is 22.19 kg/m.  GENERAL:alert, in no acute distress and comfortable SKIN: no acute rashes, no significant lesions EYES: conjunctiva are pink and non-injected, sclera anicteric OROPHARYNX: MMM, no exudates, no oropharyngeal erythema or ulceration NECK: supple, no JVD LYMPH:  no palpable lymphadenopathy in the cervical, axillary or inguinal regions LUNGS: clear to auscultation b/l with normal respiratory  effort HEART: regular rate & rhythm ABDOMEN:  normoactive bowel sounds , non tender, not distended. No palpable hepatosplenomegaly.  Extremity: no pedal edema PSYCH: alert & oriented x 3 with fluent speech NEURO: no focal motor/sensory deficits   LABORATORY DATA:   I have reviewed the data as listed  . CBC Latest Ref Rng & Units 08/17/2018 02/16/2018 08/18/2017  WBC 4.0 - 10.5 K/uL 16.8(H) 13.3(H) 14.6(H)  Hemoglobin 13.0 - 17.0 g/dL 12.5(L) 12.7(L) 12.5(L)  Hematocrit 39.0 - 52.0 % 38.4(L) 38.9(L) 37.8(L)  Platelets 150 - 400 K/uL 97(L) 111(L) 94(L)    . CMP  Latest Ref Rng & Units 08/17/2018 02/16/2018 08/18/2017  Glucose 70 - 99 mg/dL 93 99 94  BUN 8 - 23 mg/dL 15 14 14   Creatinine 0.61 - 1.24 mg/dL 1.03 1.03 0.96  Sodium 135 - 145 mmol/L 139 140 140  Potassium 3.5 - 5.1 mmol/L 4.6 4.3 4.3  Chloride 98 - 111 mmol/L 104 105 105  CO2 22 - 32 mmol/L 26 28 25   Calcium 8.9 - 10.3 mg/dL 9.4 9.2 9.0  Total Protein 6.5 - 8.1 g/dL 7.4 7.3 7.2  Total Bilirubin 0.3 - 1.2 mg/dL 0.7 0.8 0.9  Alkaline Phos 38 - 126 U/L 68 75 73  AST 15 - 41 U/L 20 17 19   ALT 0 - 44 U/L 14 8 13    Brandon Norris ABD (01/28/2016)  ABDOMEN ULTRASOUND COMPLETE  COMPARISON:  None.  FINDINGS: Gallbladder: No gallstones or wall thickening visualized. There is no pericholecystic fluid. No sonographic Murphy sign noted by sonographer.  Common bile duct: Diameter: 2 mm. There is no intrahepatic, common hepatic, or common bile duct dilatation.  Liver: No focal lesion identified. Within normal limits in parenchymal echogenicity.  IVC: No abnormality visualized.  Pancreas: Visualized portion unremarkable. Portions of pancreas obscured by gas.  Spleen: Spleen measures 16.0 x 14.2 x 5.8 cm with a measures splenic volume of 621 cubic cm. No focal splenic lesions are evident.  Right Kidney: Length: 11.4 cm. Echogenicity within normal limits. No hydronephrosis visualized. There is a parapelvic cyst in the left kidney measuring 5.1 x 3.5 x 6.3 cm.  Left Kidney: Length: 11.1 cm. Echogenicity within normal limits. No mass or hydronephrosis visualized.  Abdominal aorta: No aneurysm visualized.  Other findings: No demonstrable ascites.  IMPRESSION: Splenomegaly.  No focal splenic lesions evident.  Parapelvic cyst right kidney measuring 5.1 x 3.5 x 6.3 cm.  Portions of pancreas obscured by gas. Visualized portions of pancreas appear normal.  Study otherwise unremarkable.   Electronically Signed   By: Lowella Grip III M.D.   On: 01/28/2016 14:09  FISH,  CLL Prognostic on 08/26/16   RADIOGRAPHIC STUDIES: I have personally reviewed the radiological images as listed and agreed with the findings in the report. No results found.  ASSESSMENT & PLAN:   85 y.o. male with  1) Rai Stage 2 Chronic lymphocytic leukemia. (with splenomegaly) Trisomy 12 and a 13q deletion.  Would be stage II if splenomegaly was considered to be related to the CLL. Patient however notes that he has had splenomegaly for decades without clear etiology noted previously.  WBC counts are not changed significantly. No anemia Mild thrombocytopenia that is not significantly changed platelets around 101k.  No constitutional symptoms. No issues with infections/bleeding. No clinically palpable lymphadenopathy ( Brandon Norris showed enlarged spleen 16x14.2x5.8 cms)  PLAN: -Discussed pt labwork today, 08/17/2018; blood counts are steady, blood chemistries are steady, WBC slightly elevated, no anemia, PLT run slightly low -Recommend increasing caloric  intake to maintain weight -The pt shows no overt clinical or lab progression of his CLL at this time.  -No indication to initiate active treatment at this time.   -Pt will be mindful to look for constitutional symptoms and will let me know if he develops any of these -Discussed that if anemia or thrombocytopenia worsens, would consider initiating treatment -Will see the pt back in 6 months with labs  2) Incidentally noted left parapelvic right kidney cyst measuring 5.1 x 3.5 x 6.3 cm in size. Plan -Previously recommend CT scan of the abdomen and urology evaluation  by primary care physician. -Pt notes he will be establishing care with Dr. Tresa Moore in urology   3) . Patient Active Problem List   Diagnosis Date Noted  . CLL (chronic lymphocytic leukemia) ([place]) 01/21/2016  . Elevated WBC count 01/16/2016  . Glaucoma 01/16/2016  . Thrombocytopenia ([place]) 01/16/2016  . Diverticulosis 01/16/2016  . Encounter for colonoscopy due to history of  adenomatous colonic polyps 01/16/2016  . Allergic rhinitis 01/16/2016   Plan -f/u with PCP for mx   RTC with Dr. Irene Limbo in 6 months with labs   The total time spent in the appt was 15 minutes and more than 50% was on counseling and direct patient cares.   Sullivan Lone MD [place] AAHIVMS [facility] Hematology/Oncology Physician [facility]  (Office):       [phone] (Work cell):  [phone] (Fax):           [phone]  I, [place], am acting as a scribe for Dr. Irene Limbo  .I have reviewed the above documentation for accuracy and completeness, and I agree with the above. Brunetta Genera MD

## 2018-08-17 ENCOUNTER — Inpatient Hospital Stay (HOSPITAL_BASED_OUTPATIENT_CLINIC_OR_DEPARTMENT_OTHER): Payer: Medicare Other | Admitting: Hematology

## 2018-08-17 ENCOUNTER — Inpatient Hospital Stay: Payer: Medicare Other | Attending: Hematology

## 2018-08-17 ENCOUNTER — Other Ambulatory Visit: Payer: Self-pay

## 2018-08-17 ENCOUNTER — Telehealth: Payer: Self-pay | Admitting: Hematology

## 2018-08-17 VITALS — BP 118/71 | HR 55 | Temp 98.0°F | Resp 18 | Ht 66.5 in | Wt 139.6 lb

## 2018-08-17 DIAGNOSIS — R634 Abnormal weight loss: Secondary | ICD-10-CM | POA: Diagnosis not present

## 2018-08-17 DIAGNOSIS — R161 Splenomegaly, not elsewhere classified: Secondary | ICD-10-CM | POA: Diagnosis not present

## 2018-08-17 DIAGNOSIS — N281 Cyst of kidney, acquired: Secondary | ICD-10-CM | POA: Insufficient documentation

## 2018-08-17 DIAGNOSIS — C9110 Chronic lymphocytic leukemia of B-cell type not having achieved remission: Secondary | ICD-10-CM | POA: Diagnosis not present

## 2018-08-17 LAB — CMP (CANCER CENTER ONLY)
ALT: 14 U/L (ref 0–44)
AST: 20 U/L (ref 15–41)
Albumin: 4.1 g/dL (ref 3.5–5.0)
Alkaline Phosphatase: 68 U/L (ref 38–126)
Anion gap: 9 (ref 5–15)
BUN: 15 mg/dL (ref 8–23)
CO2: 26 mmol/L (ref 22–32)
Calcium: 9.4 mg/dL (ref 8.9–10.3)
Chloride: 104 mmol/L (ref 98–111)
Creatinine: 1.03 mg/dL (ref 0.61–1.24)
GFR, Est AFR Am: >60 mL/min (ref >60)
GFR, Estimated: >60 mL/min (ref >60)
Glucose, Bld: 93 mg/dL (ref 70–99)
Potassium: 4.6 mmol/L (ref 3.5–5.1)
Sodium: 139 mmol/L (ref 135–145)
Total Bilirubin: 0.7 mg/dL (ref 0.3–1.2)
Total Protein: 7.4 g/dL (ref 6.5–8.1)

## 2018-08-17 LAB — CBC WITH DIFFERENTIAL/PLATELET
Abs Immature Granulocytes: 0.02 10*3/uL (ref 0.00–0.07)
Basophils Absolute: 0 10*3/uL (ref 0.0–0.1)
Basophils Relative: 0 %
Eosinophils Absolute: 0.1 10*3/uL (ref 0.0–0.5)
Eosinophils Relative: 1 %
HCT: 38.4 % — ABNORMAL LOW (ref 39.0–52.0)
Hemoglobin: 12.5 g/dL — ABNORMAL LOW (ref 13.0–17.0)
Immature Granulocytes: 0 %
Lymphocytes Relative: 71 %
Lymphs Abs: 11.9 10*3/uL — ABNORMAL HIGH (ref 0.7–4.0)
MCH: 32 pg (ref 26.0–34.0)
MCHC: 32.6 g/dL (ref 30.0–36.0)
MCV: 98.2 fL (ref 80.0–100.0)
Monocytes Absolute: 1.9 10*3/uL — ABNORMAL HIGH (ref 0.1–1.0)
Monocytes Relative: 11 %
Neutro Abs: 2.9 10*3/uL (ref 1.7–7.7)
Neutrophils Relative %: 17 %
Platelets: 97 10*3/uL — ABNORMAL LOW (ref 150–400)
RBC: 3.91 MIL/uL — ABNORMAL LOW (ref 4.22–5.81)
RDW: 13.8 % (ref 11.5–15.5)
WBC: 16.8 10*3/uL — ABNORMAL HIGH (ref 4.0–10.5)
nRBC: 0 % (ref 0.0–0.2)

## 2018-08-17 LAB — LACTATE DEHYDROGENASE: LDH: 196 U/L — ABNORMAL HIGH (ref 98–192)

## 2018-08-17 NOTE — Telephone Encounter (Signed)
Scheduled per 08/11 los, patient received avs and calender.

## 2018-08-31 DIAGNOSIS — H401133 Primary open-angle glaucoma, bilateral, severe stage: Secondary | ICD-10-CM | POA: Diagnosis not present

## 2019-01-27 DIAGNOSIS — D696 Thrombocytopenia, unspecified: Secondary | ICD-10-CM | POA: Diagnosis not present

## 2019-01-27 DIAGNOSIS — Z7982 Long term (current) use of aspirin: Secondary | ICD-10-CM | POA: Diagnosis not present

## 2019-02-01 DIAGNOSIS — D696 Thrombocytopenia, unspecified: Secondary | ICD-10-CM | POA: Diagnosis not present

## 2019-02-01 DIAGNOSIS — Z0001 Encounter for general adult medical examination with abnormal findings: Secondary | ICD-10-CM | POA: Diagnosis not present

## 2019-02-01 DIAGNOSIS — J309 Allergic rhinitis, unspecified: Secondary | ICD-10-CM | POA: Diagnosis not present

## 2019-02-01 DIAGNOSIS — N401 Enlarged prostate with lower urinary tract symptoms: Secondary | ICD-10-CM | POA: Diagnosis not present

## 2019-02-14 ENCOUNTER — Other Ambulatory Visit: Payer: Self-pay | Admitting: *Deleted

## 2019-02-14 ENCOUNTER — Ambulatory Visit: Payer: Medicare Other | Attending: Internal Medicine

## 2019-02-14 DIAGNOSIS — C9110 Chronic lymphocytic leukemia of B-cell type not having achieved remission: Secondary | ICD-10-CM

## 2019-02-14 DIAGNOSIS — Z23 Encounter for immunization: Secondary | ICD-10-CM | POA: Insufficient documentation

## 2019-02-14 NOTE — Progress Notes (Signed)
   Covid-19 Vaccination Clinic  Name:  Brandon Norris    MRN: [id number] DOB: [birth year]  02/14/2019  Mr. Brandon Norris was observed post Covid-19 immunization for 15 minutes without incidence. He was provided with Vaccine Information Sheet and instruction to access the V-Safe system.   Mr. Brandon Norris was instructed to call 911 with any severe reactions post vaccine: [name] Difficulty breathing  . Swelling of your face and throat  . A fast heartbeat  . A bad rash all over your body  . Dizziness and weakness    Immunizations Administered    Name Date Dose VIS Date Route   Pfizer COVID-19 Vaccine 02/14/2019  2:40 PM 0.3 mL 12/17/2018 Intramuscular   Manufacturer: [place]   Lot: [id number]   [place]: [id number]

## 2019-02-15 ENCOUNTER — Other Ambulatory Visit: Payer: Self-pay

## 2019-02-15 ENCOUNTER — Inpatient Hospital Stay: Payer: Medicare Other | Attending: Hematology | Admitting: Hematology

## 2019-02-15 ENCOUNTER — Inpatient Hospital Stay: Payer: Medicare Other

## 2019-02-15 VITALS — BP 137/57 | HR 59 | Temp 98.2°F | Resp 18 | Ht 66.5 in | Wt 137.9 lb

## 2019-02-15 DIAGNOSIS — N281 Cyst of kidney, acquired: Secondary | ICD-10-CM | POA: Insufficient documentation

## 2019-02-15 DIAGNOSIS — C9110 Chronic lymphocytic leukemia of B-cell type not having achieved remission: Secondary | ICD-10-CM | POA: Insufficient documentation

## 2019-02-15 DIAGNOSIS — D696 Thrombocytopenia, unspecified: Secondary | ICD-10-CM

## 2019-02-15 LAB — CMP (CANCER CENTER ONLY)
ALT: 12 U/L (ref 0–44)
AST: 16 U/L (ref 15–41)
Albumin: 4.1 g/dL (ref 3.5–5.0)
Alkaline Phosphatase: 69 U/L (ref 38–126)
Anion gap: 6 (ref 5–15)
BUN: 17 mg/dL (ref 8–23)
CO2: 28 mmol/L (ref 22–32)
Calcium: 9 mg/dL (ref 8.9–10.3)
Chloride: 108 mmol/L (ref 98–111)
Creatinine: 0.9 mg/dL (ref 0.61–1.24)
GFR, Est AFR Am: >60 mL/min (ref >60)
GFR, Estimated: >60 mL/min (ref >60)
Glucose, Bld: 95 mg/dL (ref 70–99)
Potassium: 4.2 mmol/L (ref 3.5–5.1)
Sodium: 142 mmol/L (ref 135–145)
Total Bilirubin: 0.7 mg/dL (ref 0.3–1.2)
Total Protein: 7.3 g/dL (ref 6.5–8.1)

## 2019-02-15 LAB — CBC WITH DIFFERENTIAL (CANCER CENTER ONLY)
Abs Immature Granulocytes: 0.02 10*3/uL (ref 0.00–0.07)
Basophils Absolute: 0 10*3/uL (ref 0.0–0.1)
Basophils Relative: 0 %
Eosinophils Absolute: 0.1 10*3/uL (ref 0.0–0.5)
Eosinophils Relative: 1 %
HCT: 36.9 % — ABNORMAL LOW (ref 39.0–52.0)
Hemoglobin: 12.1 g/dL — ABNORMAL LOW (ref 13.0–17.0)
Immature Granulocytes: 0 %
Lymphocytes Relative: 71 %
Lymphs Abs: 11.2 10*3/uL — ABNORMAL HIGH (ref 0.7–4.0)
MCH: 32.6 pg (ref 26.0–34.0)
MCHC: 32.8 g/dL (ref 30.0–36.0)
MCV: 99.5 fL (ref 80.0–100.0)
Monocytes Absolute: 1.5 10*3/uL — ABNORMAL HIGH (ref 0.1–1.0)
Monocytes Relative: 10 %
Neutro Abs: 2.8 10*3/uL (ref 1.7–7.7)
Neutrophils Relative %: 18 %
Platelet Count: 104 10*3/uL — ABNORMAL LOW (ref 150–400)
RBC: 3.71 MIL/uL — ABNORMAL LOW (ref 4.22–5.81)
RDW: 14.3 % (ref 11.5–15.5)
WBC Count: 15.7 10*3/uL — ABNORMAL HIGH (ref 4.0–10.5)
nRBC: 0 % (ref 0.0–0.2)

## 2019-02-15 LAB — RETICULOCYTES
Immature Retic Fract: 8 % (ref 2.3–15.9)
RBC.: 3.68 MIL/uL — ABNORMAL LOW (ref 4.22–5.81)
Retic Count, Absolute: 49.7 10*3/uL (ref 19.0–186.0)
Retic Ct Pct: 1.4 % (ref 0.4–3.1)

## 2019-02-15 LAB — LACTATE DEHYDROGENASE: LDH: 176 U/L (ref 98–192)

## 2019-02-15 NOTE — Progress Notes (Signed)
[name]  HEMATOLOGY ONCOLOGY PROGRESS NOTE  Date of service:  02/15/19   Patient Care Team: [name], MD as PCP - General (Internal Medicine)  Chief Complaint:  F/u for CLL  Diagnosis: CLL  Current Treatment: Observation  INTERVAL HISTORY:  Brandon Norris returns today for management and evaluation of his CLL. The patient's last visit with [place] was on 08/17/2018. The pt reports that he is doing well overall.  The pt reports that he has felt well in the last 6 months and has no new concerns. He received the first dose of the COVID19 vaccine and had no issues. He has the second dose scheduled for the beginning of March. He has not lost any weight since our last visit but has found it difficult to gain weight. 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   Lab results today (02/15/19) of CBC w/diff and CMP is as follows: all values are WNL except for WBC at 15.7K, RBC at 3.71, Hgb at 12.1, HCT at 36.9, PLT at 104K, Lymphs Abs at 11.2K, Mono Abs at 1.5K. 02/15/2019 Reticulocytes is as follows: Retic Ct Pct at 1.4, RBC at 3.68, Retic Ct Abs at 49.7, Immature Retic Fract at 8.0  02/15/2019 LDH at 176  On review of systems, pt denies fevers, chills, night sweats, unexpected weight loss, abdominal pain, leg swelling and any other symptoms.   REVIEW OF SYSTEMS:   A 10+ POINT REVIEW OF SYSTEMS WAS OBTAINED including neurology, dermatology, psychiatry, cardiac, respiratory, lymph, extremities, GI, GU, Musculoskeletal, constitutional, breasts, reproductive, HEENT.  All pertinent positives are noted in the HPI.  All others are negative.  . Past Medical History:  Diagnosis Date  . Actinic keratoses   . Allergic rhinitis   . Atypical nevi   . Diverticulosis   . Enlarged prostate   . Glaucoma   . Glaucoma   . H/O degenerative disc disease   . Hx of adenomatous colonic polyps   . Malignant melanoma of skin of abdomen ([place]) 2016  . Rotator cuff tendinitis   . Thrombocytopenia ([place])       . Past Surgical History:  Procedure Laterality Date  . HERNIA REPAIR    . Left inguinal herniorrhaphy    . Melanoma on abdomen  2016    . Social History   Tobacco Use  . Smoking status: Never Smoker  . Smokeless tobacco: Never Used  Substance Use Topics  . Alcohol use: Yes    Alcohol/week: 3.0 standard drinks    Types: 3 Glasses of wine per week  . Drug use: No    ALLERGIES:  has No Known Allergies.  MEDICATIONS:  Current Outpatient Medications  Medication Sig Dispense Refill  . latanoprost (XALATAN) 0.005 % ophthalmic solution Place 1 drop into both eyes daily.  1  . Multiple Vitamin (MULTIVITAMIN) tablet Take 1 tablet by mouth daily.    . timolol (TIMOPTIC) 0.5 % ophthalmic solution Place 1 drop into both eyes 2 (two) times daily.   0   No current facility-administered medications for this visit.    PHYSICAL EXAMINATION: ECOG PERFORMANCE STATUS: 1 - Symptomatic but completely ambulatory  Vitals:   02/15/19 1415  BP: (!) 137/57  Pulse: (!) 59  Resp: 18  Temp: 98.2 F (36.8 C)  SpO2: 100%    Filed Weights   02/15/19 1415  Weight: 137 lb 14.4 oz (62.6 kg)   .Body mass index is 21.92 kg/m.   GENERAL:alert, in no acute distress and comfortable SKIN:  no acute rashes, no significant lesions EYES: conjunctiva are pink and non-injected, sclera anicteric OROPHARYNX: MMM, no exudates, no oropharyngeal erythema or ulceration NECK: supple, no JVD LYMPH:  no palpable lymphadenopathy in the cervical, axillary or inguinal regions LUNGS: clear to auscultation b/l with normal respiratory effort HEART: regular rate & rhythm ABDOMEN:  normoactive bowel sounds , non tender, not distended. No palpable hepatosplenomegaly.  Extremity: no pedal edema PSYCH: alert & oriented x 3 with fluent speech NEURO: no focal motor/sensory deficits  LABORATORY DATA:   I have reviewed the data as listed  . CBC Latest Ref Rng & Units 02/15/2019 08/17/2018 02/16/2018  WBC 4.0 - 10.5  K/uL 15.7(H) 16.8(H) 13.3(H)  Hemoglobin 13.0 - 17.0 g/dL 12.1(L) 12.5(L) 12.7(L)  Hematocrit 39.0 - 52.0 % 36.9(L) 38.4(L) 38.9(L)  Platelets 150 - 400 K/uL 104(L) 97(L) 111(L)    . CMP Latest Ref Rng & Units 02/15/2019 08/17/2018 02/16/2018  Glucose 70 - 99 mg/dL 95 93 99  BUN 8 - 23 mg/dL 17 15 14   Creatinine 0.61 - 1.24 mg/dL 0.90 1.03 1.03  Sodium 135 - 145 mmol/L 142 139 140  Potassium 3.5 - 5.1 mmol/L 4.2 4.6 4.3  Chloride 98 - 111 mmol/L 108 104 105  CO2 22 - 32 mmol/L 28 26 28   Calcium 8.9 - 10.3 mg/dL 9.0 9.4 9.2  Total Protein 6.5 - 8.1 g/dL 7.3 7.4 7.3  Total Bilirubin 0.3 - 1.2 mg/dL 0.7 0.7 0.8  Alkaline Phos 38 - 126 U/L 69 68 75  AST 15 - 41 U/L 16 20 17   ALT 0 - 44 U/L 12 14 8    [place] ABD (01/28/2016)  ABDOMEN ULTRASOUND COMPLETE  COMPARISON:  None.  FINDINGS: Gallbladder: No gallstones or wall thickening visualized. There is no pericholecystic fluid. No sonographic Murphy sign noted by sonographer.  Common bile duct: Diameter: 2 mm. There is no intrahepatic, common hepatic, or common bile duct dilatation.  Liver: No focal lesion identified. Within normal limits in parenchymal echogenicity.  IVC: No abnormality visualized.  Pancreas: Visualized portion unremarkable. Portions of pancreas obscured by gas.  Spleen: Spleen measures 16.0 x 14.2 x 5.8 cm with a measures splenic volume of 621 cubic cm. No focal splenic lesions are evident.  Right Kidney: Length: 11.4 cm. Echogenicity within normal limits. No hydronephrosis visualized. There is a parapelvic cyst in the left kidney measuring 5.1 x 3.5 x 6.3 cm.  Left Kidney: Length: 11.1 cm. Echogenicity within normal limits. No mass or hydronephrosis visualized.  Abdominal aorta: No aneurysm visualized.  Other findings: No demonstrable ascites.  IMPRESSION: Splenomegaly.  No focal splenic lesions evident.  Parapelvic cyst right kidney measuring 5.1 x 3.5 x 6.3 cm.  Portions of pancreas  obscured by gas. Visualized portions of pancreas appear normal.  Study otherwise unremarkable.   Electronically Signed   By: Lowella Grip III M.D.   On: 01/28/2016 14:09  FISH, CLL Prognostic on 08/26/16   RADIOGRAPHIC STUDIES: I have personally reviewed the radiological images as listed and agreed with the findings in the report. No results found.  ASSESSMENT & PLAN:   86 y.o. male with  1) Rai Stage 2 Chronic lymphocytic leukemia. (with splenomegaly) Trisomy 12 and a 13q deletion.  Would be stage II if splenomegaly was considered to be related to the CLL. Patient however notes that he has had splenomegaly for decades without clear etiology noted previously.  WBC counts are not changed significantly. No anemia Mild thrombocytopenia that is not significantly changed platelets around 101k.  No constitutional symptoms. No issues with infections/bleeding. No clinically palpable lymphadenopathy ( US showed enlarged spleen 16x14.2x5.8 cms)  PLAN: -Discussed pt labwork today, 02/15/19; WBC and PLT are steady, no new anemia, blood chemistries are nml -Discussed 02/15/2019 Reticulocytes is as follows: Retic Ct Pct at 1.4, RBC at 3.68, Retic Ct Abs at 49.7, Immature Retic Fract at 8.0  -Discussed 02/15/2019 LDH is WNL at 176 -The pt shows no overt clinical or lab progression of his CLL at this time. -No indication to initiate active treatment at this time.   -Advised pt of symptoms to be aware of such as: new lumps/bumps, new fatigue, fevers, chills, night sweats, and abdominal pain/distension -Pt advised to contact in the event of major changes in symptomology -Recommend pt f/u for the second dose of COVID19 vaccine as scheduled  -Will see the pt back in 6 months with labs  2) Incidentally noted left parapelvic right kidney cyst measuring 5.1 x 3.5 x 6.3 cm in size. Plan -Previously recommend CT scan of the abdomen and urology evaluation  by primary care physician. -Pt  notes he will be establishing care with Dr. Tresa Moore in urology   3) . Patient Active Problem List   Diagnosis Date Noted  . CLL (chronic lymphocytic leukemia) ([place]) 01/21/2016  . Elevated WBC count 01/16/2016  . Glaucoma 01/16/2016  . Thrombocytopenia ([place]) 01/16/2016  . Diverticulosis 01/16/2016  . Encounter for colonoscopy due to history of adenomatous colonic polyps 01/16/2016  . Allergic rhinitis 01/16/2016   Plan -f/u with PCP for mx   FOLLOW UP: RTC with Dr Irene Limbo with labs in 6 months   The total time spent in the appt was 20 minutes and more than 50% was on counseling and direct patient cares.  All of the patient's questions were answered with apparent satisfaction. The patient knows to call the clinic with any problems, questions or concerns.   Sullivan Lone MD [place] AAHIVMS [facility] Hematology/Oncology Physician [facility]  (Office):       [phone] (Work cell):  [phone] (Fax):           [phone]  I, Yevette Edwards, am acting as a scribe for Dr. Sullivan Lone.   .I have reviewed the above documentation for accuracy and completeness, and I agree with the above. Brunetta Genera MD

## 2019-03-01 DIAGNOSIS — H35372 Puckering of macula, left eye: Secondary | ICD-10-CM | POA: Diagnosis not present

## 2019-03-01 DIAGNOSIS — H2513 Age-related nuclear cataract, bilateral: Secondary | ICD-10-CM | POA: Diagnosis not present

## 2019-03-01 DIAGNOSIS — H401133 Primary open-angle glaucoma, bilateral, severe stage: Secondary | ICD-10-CM | POA: Diagnosis not present

## 2019-03-01 DIAGNOSIS — H25013 Cortical age-related cataract, bilateral: Secondary | ICD-10-CM | POA: Diagnosis not present

## 2019-03-11 ENCOUNTER — Ambulatory Visit: Payer: Medicare Other | Attending: Internal Medicine

## 2019-03-11 DIAGNOSIS — Z23 Encounter for immunization: Secondary | ICD-10-CM | POA: Insufficient documentation

## 2019-03-11 NOTE — Progress Notes (Signed)
   Covid-19 Vaccination Clinic  Name:  Brandon Norris    MRN: [id number] DOB: [birth year]  03/11/2019  Brandon Norris was observed post Covid-19 immunization for 15 minutes without incident. He was provided with Vaccine Information Sheet and instruction to access the V-Safe system.   Brandon Norris was instructed to call 911 with any severe reactions post vaccine: [name] Difficulty breathing  . Swelling of face and throat  . A fast heartbeat  . A bad rash all over body  . Dizziness and weakness   Immunizations Administered    Name Date Dose VIS Date Route   Pfizer COVID-19 Vaccine 03/11/2019 12:59 PM 0.3 mL 12/17/2018 Intramuscular   Manufacturer: [place]   Lot: [id number]   [place]: [id number]

## 2019-03-23 DIAGNOSIS — L718 Other rosacea: Secondary | ICD-10-CM | POA: Diagnosis not present

## 2019-03-23 DIAGNOSIS — D225 Melanocytic nevi of trunk: Secondary | ICD-10-CM | POA: Diagnosis not present

## 2019-03-23 DIAGNOSIS — L821 Other seborrheic keratosis: Secondary | ICD-10-CM | POA: Diagnosis not present

## 2019-03-23 DIAGNOSIS — Z85828 Personal history of other malignant neoplasm of skin: Secondary | ICD-10-CM | POA: Diagnosis not present

## 2019-03-23 DIAGNOSIS — C44612 Basal cell carcinoma of skin of right upper limb, including shoulder: Secondary | ICD-10-CM | POA: Diagnosis not present

## 2019-03-24 DIAGNOSIS — H2512 Age-related nuclear cataract, left eye: Secondary | ICD-10-CM | POA: Diagnosis not present

## 2019-03-24 DIAGNOSIS — H25812 Combined forms of age-related cataract, left eye: Secondary | ICD-10-CM | POA: Diagnosis not present

## 2019-03-24 DIAGNOSIS — H25012 Cortical age-related cataract, left eye: Secondary | ICD-10-CM | POA: Diagnosis not present

## 2019-04-21 DIAGNOSIS — H25011 Cortical age-related cataract, right eye: Secondary | ICD-10-CM | POA: Diagnosis not present

## 2019-04-21 DIAGNOSIS — H25811 Combined forms of age-related cataract, right eye: Secondary | ICD-10-CM | POA: Diagnosis not present

## 2019-04-21 DIAGNOSIS — H2511 Age-related nuclear cataract, right eye: Secondary | ICD-10-CM | POA: Diagnosis not present

## 2019-05-17 DIAGNOSIS — H35373 Puckering of macula, bilateral: Secondary | ICD-10-CM | POA: Diagnosis not present

## 2019-06-28 DIAGNOSIS — Z0120 Encounter for dental examination and cleaning without abnormal findings: Secondary | ICD-10-CM | POA: Diagnosis not present

## 2019-08-12 ENCOUNTER — Telehealth: Payer: Self-pay | Admitting: Hematology

## 2019-08-12 NOTE — Telephone Encounter (Signed)
Rescheduled 08/10 to 09/01 due to scheduling change, patient has been called and notified.

## 2019-08-16 ENCOUNTER — Ambulatory Visit: Payer: Medicare Other | Admitting: Hematology

## 2019-08-16 ENCOUNTER — Other Ambulatory Visit: Payer: Medicare Other

## 2019-09-07 ENCOUNTER — Inpatient Hospital Stay: Payer: Medicare Other | Attending: Hematology

## 2019-09-07 ENCOUNTER — Ambulatory Visit: Payer: Medicare Other | Admitting: Hematology

## 2019-09-07 ENCOUNTER — Telehealth: Payer: Self-pay | Admitting: Hematology

## 2019-09-07 ENCOUNTER — Inpatient Hospital Stay (HOSPITAL_BASED_OUTPATIENT_CLINIC_OR_DEPARTMENT_OTHER): Payer: Medicare Other | Admitting: Hematology

## 2019-09-07 ENCOUNTER — Other Ambulatory Visit: Payer: Medicare Other

## 2019-09-07 ENCOUNTER — Other Ambulatory Visit: Payer: Self-pay

## 2019-09-07 VITALS — BP 112/56 | HR 60 | Temp 97.0°F | Resp 18 | Ht 66.5 in | Wt 137.9 lb

## 2019-09-07 DIAGNOSIS — C9110 Chronic lymphocytic leukemia of B-cell type not having achieved remission: Secondary | ICD-10-CM

## 2019-09-07 DIAGNOSIS — N281 Cyst of kidney, acquired: Secondary | ICD-10-CM | POA: Insufficient documentation

## 2019-09-07 DIAGNOSIS — Z23 Encounter for immunization: Secondary | ICD-10-CM | POA: Diagnosis not present

## 2019-09-07 DIAGNOSIS — R161 Splenomegaly, not elsewhere classified: Secondary | ICD-10-CM | POA: Diagnosis not present

## 2019-09-07 DIAGNOSIS — D696 Thrombocytopenia, unspecified: Secondary | ICD-10-CM | POA: Diagnosis not present

## 2019-09-07 LAB — CBC WITH DIFFERENTIAL/PLATELET
Abs Immature Granulocytes: 0.01 10*3/uL (ref 0.00–0.07)
Basophils Absolute: 0 10*3/uL (ref 0.0–0.1)
Basophils Relative: 0 %
Eosinophils Absolute: 0.2 10*3/uL (ref 0.0–0.5)
Eosinophils Relative: 1 %
HCT: 36.6 % — ABNORMAL LOW (ref 39.0–52.0)
Hemoglobin: 12 g/dL — ABNORMAL LOW (ref 13.0–17.0)
Immature Granulocytes: 0 %
Lymphocytes Relative: 70 %
Lymphs Abs: 11.8 10*3/uL — ABNORMAL HIGH (ref 0.7–4.0)
MCH: 32.8 pg (ref 26.0–34.0)
MCHC: 32.8 g/dL (ref 30.0–36.0)
MCV: 100 fL (ref 80.0–100.0)
Monocytes Absolute: 2.8 10*3/uL — ABNORMAL HIGH (ref 0.1–1.0)
Monocytes Relative: 17 %
Neutro Abs: 2 10*3/uL (ref 1.7–7.7)
Neutrophils Relative %: 12 %
Platelets: 103 10*3/uL — ABNORMAL LOW (ref 150–400)
RBC: 3.66 MIL/uL — ABNORMAL LOW (ref 4.22–5.81)
RDW: 14.1 % (ref 11.5–15.5)
WBC: 16.9 10*3/uL — ABNORMAL HIGH (ref 4.0–10.5)
nRBC: 0 % (ref 0.0–0.2)

## 2019-09-07 LAB — CMP (CANCER CENTER ONLY)
ALT: 10 U/L (ref 0–44)
AST: 16 U/L (ref 15–41)
Albumin: 3.8 g/dL (ref 3.5–5.0)
Alkaline Phosphatase: 66 U/L (ref 38–126)
Anion gap: 5 (ref 5–15)
BUN: 16 mg/dL (ref 8–23)
CO2: 32 mmol/L (ref 22–32)
Calcium: 9.9 mg/dL (ref 8.9–10.3)
Chloride: 105 mmol/L (ref 98–111)
Creatinine: 1.01 mg/dL (ref 0.61–1.24)
GFR, Est AFR Am: >60 mL/min (ref >60)
GFR, Estimated: >60 mL/min (ref >60)
Glucose, Bld: 92 mg/dL (ref 70–99)
Potassium: 4.1 mmol/L (ref 3.5–5.1)
Sodium: 142 mmol/L (ref 135–145)
Total Bilirubin: 0.5 mg/dL (ref 0.3–1.2)
Total Protein: 7.1 g/dL (ref 6.5–8.1)

## 2019-09-07 LAB — LACTATE DEHYDROGENASE: LDH: 206 U/L — ABNORMAL HIGH (ref 98–192)

## 2019-09-07 NOTE — Telephone Encounter (Signed)
Scheduled appointment per 9/1 los. Gave patient calendar print out.

## 2019-09-07 NOTE — Progress Notes (Signed)
[name]  HEMATOLOGY ONCOLOGY PROGRESS NOTE  Date of service:  09/07/19   Patient Care Team: [name], MD as PCP - General (Internal Medicine)  Chief Complaint:  F/u for CLL  Diagnosis: CLL  Current Treatment: Observation  INTERVAL HISTORY: Brandon Norris returns today for management and evaluation of his CLL. The patient's last visit with [place] was on 02/15/2019. The pt reports that he is doing well overall.  The pt reports that he has no new constitutional symptoms. He reports that sometimes his knees feel "odd" and weak. He denies knee pain. Pt has difficulty drinking an adequate amount of water and drinks too much coffee. He eats a good, balanced diet and denies any unexpected weight loss.   Pt has been seen by a Urologist who felt that his right kidney cyst was benign and needed no intervention. Pt visits the Dermatologist annually.   Lab results today (09/07/19) of CBC w/diff and CMP is as follows: all values are WNL except for WBC at 16.9K, RBC at 3.66, Hgb at 12.0, HCT at 36.6, PLT at 103K, Lymphs Abs at 11.8K, Mono Abs at 2.8K, Smudge Cells are "Present". 09/07/2019 LDH at 206  On review of systems, pt reports knee weakness and denies new back pain, unexpected weight loss, knee pain, low appetite, fevers, chills, night sweats, new lumps/bumps, abdominal pain and any other symptoms.    REVIEW OF SYSTEMS:   A 10+ POINT REVIEW OF SYSTEMS WAS OBTAINED including neurology, dermatology, psychiatry, cardiac, respiratory, lymph, extremities, GI, GU, Musculoskeletal, constitutional, breasts, reproductive, HEENT.  All pertinent positives are noted in the HPI.  All others are negative.  . Past Medical History:  Diagnosis Date  . Actinic keratoses   . Allergic rhinitis   . Atypical nevi   . Diverticulosis   . Enlarged prostate   . Glaucoma   . Glaucoma   . H/O degenerative disc disease   . Hx of adenomatous colonic polyps   . Malignant melanoma of skin of abdomen ([place]) 2016   . Rotator cuff tendinitis   . Thrombocytopenia ([place])     . Past Surgical History:  Procedure Laterality Date  . HERNIA REPAIR    . Left inguinal herniorrhaphy    . Melanoma on abdomen  2016    . Social History   Tobacco Use  . Smoking status: Never Smoker  . Smokeless tobacco: Never Used  Vaping Use  . Vaping Use: Never used  Substance Use Topics  . Alcohol use: Yes    Alcohol/week: 3.0 standard drinks    Types: 3 Glasses of wine per week  . Drug use: No    ALLERGIES:  has No Known Allergies.  MEDICATIONS:  Current Outpatient Medications  Medication Sig Dispense Refill  . latanoprost (XALATAN) 0.005 % ophthalmic solution Place 1 drop into both eyes daily.  1  . Multiple Vitamin (MULTIVITAMIN) tablet Take 1 tablet by mouth daily.    . timolol (TIMOPTIC) 0.5 % ophthalmic solution Place 1 drop into both eyes 2 (two) times daily.   0   No current facility-administered medications for this visit.    PHYSICAL EXAMINATION: ECOG PERFORMANCE STATUS: 1 - Symptomatic but completely ambulatory  Vitals:   09/07/19 1512  BP: (!) 112/56  Pulse: 60  Resp: 18  Temp: (!) 97 F (36.1 C)  SpO2: 100%    Filed Weights   09/07/19 1512  Weight: 137 lb 14.4 oz (62.6 kg)   .Body mass index is 21.92 kg/m.   GENERAL:alert, in  no acute distress and comfortable SKIN: no acute rashes, no significant lesions EYES: conjunctiva are pink and non-injected, sclera anicteric OROPHARYNX: MMM, no exudates, no oropharyngeal erythema or ulceration NECK: supple, no JVD LYMPH:  no palpable lymphadenopathy in the cervical, axillary or inguinal regions LUNGS: clear to auscultation b/l with normal respiratory effort HEART: regular rate & rhythm ABDOMEN:  normoactive bowel sounds , non tender, not distended. No palpable hepatosplenomegaly.  Extremity: no pedal edema PSYCH: alert & oriented x 3 with fluent speech NEURO: no focal motor/sensory deficits  LABORATORY DATA:   I have reviewed  the data as listed  . CBC Latest Ref Rng & Units 09/07/2019 02/15/2019 08/17/2018  WBC 4.0 - 10.5 K/uL 16.9(H) 15.7(H) 16.8(H)  Hemoglobin 13.0 - 17.0 g/dL 12.0(L) 12.1(L) 12.5(L)  Hematocrit 39 - 52 % 36.6(L) 36.9(L) 38.4(L)  Platelets 150 - 400 K/uL 103(L) 104(L) 97(L)    . CMP Latest Ref Rng & Units 09/07/2019 02/15/2019 08/17/2018  Glucose 70 - 99 mg/dL 92 95 93  BUN 8 - 23 mg/dL 16 17 15   Creatinine 0.61 - 1.24 mg/dL 1.01 0.90 1.03  Sodium 135 - 145 mmol/L 142 142 139  Potassium 3.5 - 5.1 mmol/L 4.1 4.2 4.6  Chloride 98 - 111 mmol/L 105 108 104  CO2 22 - 32 mmol/L 32 28 26  Calcium 8.9 - 10.3 mg/dL 9.9 9.0 9.4  Total Protein 6.5 - 8.1 g/dL 7.1 7.3 7.4  Total Bilirubin 0.3 - 1.2 mg/dL 0.5 0.7 0.7  Alkaline Phos 38 - 126 U/L 66 69 68  AST 15 - 41 U/L 16 16 20   ALT 0 - 44 U/L 10 12 14    [place] ABD (01/28/2016)  ABDOMEN ULTRASOUND COMPLETE  COMPARISON:  None.  FINDINGS: Gallbladder: No gallstones or wall thickening visualized. There is no pericholecystic fluid. No sonographic Murphy sign noted by sonographer.  Common bile duct: Diameter: 2 mm. There is no intrahepatic, common hepatic, or common bile duct dilatation.  Liver: No focal lesion identified. Within normal limits in parenchymal echogenicity.  IVC: No abnormality visualized.  Pancreas: Visualized portion unremarkable. Portions of pancreas obscured by gas.  Spleen: Spleen measures 16.0 x 14.2 x 5.8 cm with a measures splenic volume of 621 cubic cm. No focal splenic lesions are evident.  Right Kidney: Length: 11.4 cm. Echogenicity within normal limits. No hydronephrosis visualized. There is a parapelvic cyst in the left kidney measuring 5.1 x 3.5 x 6.3 cm.  Left Kidney: Length: 11.1 cm. Echogenicity within normal limits. No mass or hydronephrosis visualized.  Abdominal aorta: No aneurysm visualized.  Other findings: No demonstrable ascites.  IMPRESSION: Splenomegaly.  No focal splenic lesions  evident.  Parapelvic cyst right kidney measuring 5.1 x 3.5 x 6.3 cm.  Portions of pancreas obscured by gas. Visualized portions of pancreas appear normal.  Study otherwise unremarkable.   Electronically Signed   By: Lowella Grip III M.D.   On: 01/28/2016 14:09  FISH, CLL Prognostic on 08/26/16   RADIOGRAPHIC STUDIES: I have personally reviewed the radiological images as listed and agreed with the findings in the report. No results found.  ASSESSMENT & PLAN:   86 y.o. male with  1) Rai Stage 2 Chronic lymphocytic leukemia. (with splenomegaly) Trisomy 12 and a 13q deletion.  Would be stage II if splenomegaly was considered to be related to the CLL. Patient however notes that he has had splenomegaly for decades without clear etiology noted previously.  WBC counts are not changed significantly. No anemia Mild thrombocytopenia that is  not significantly changed platelets around 101k.  No constitutional symptoms. No issues with infections/bleeding. No clinically palpable lymphadenopathy ( US showed enlarged spleen 16x14.2x5.8 cms)  PLAN: -Discussed pt labwork today, 09/07/19; no anemia, WBC & PLT are stable, blood chemistries are nml, LDH is borderline elevated. -The pt shows no overt clinical or lab progression of his CLL at this time. No indication to initiate active treatment.  -Advised pt of symptoms to be aware of including: debilitating fatigue, drenching night sweats, unexpected weight loss & abnormal bleeding. -Advised pt that the risk of traumatic bleeding increases if PLT <50K and the risk of spontaneous bleeding increases if PLT <20K.  -Discussed CDC guidelines regarding the COVID19 booster. Recommend pt return and receive in clinic. -Recommend pt receive annual flu vaccine when available. Advised pt to wait at least two weeks between vaccinations.  -Advised pt that CLL is a risk factor for non-Melanoma skin cancers. Continue routine f/u with  Dermatologist. -Will see the pt back in 6 months with labs   2) Incidentally noted left parapelvic right kidney cyst measuring 5.1 x 3.5 x 6.3 cm in size. Plan -Previously recommend CT scan of the abdomen and urology evaluation  by primary care physician. -Pt notes he will be establishing care with Dr. Tresa Moore in urology   3) . Patient Active Problem List   Diagnosis Date Noted  . CLL (chronic lymphocytic leukemia) ([place]) 01/21/2016  . Elevated WBC count 01/16/2016  . Glaucoma 01/16/2016  . Thrombocytopenia ([place]) 01/16/2016  . Diverticulosis 01/16/2016  . Encounter for colonoscopy due to history of adenomatous colonic polyps 01/16/2016  . Allergic rhinitis 01/16/2016   Plan -f/u with PCP for mx   FOLLOW UP: Plz schedule for covid booster vaccination per patient preference RTC with Dr Irene Limbo with labs in 6 months    The total time spent in the appt was 20 minutes and more than 50% was on counseling and direct patient cares.  All of the patient's questions were answered with apparent satisfaction. The patient knows to call the clinic with any problems, questions or concerns.    Sullivan Lone MD [place] AAHIVMS [facility] Hematology/Oncology Physician [facility]  (Office):       [phone] (Work cell):  [phone] (Fax):           [phone]  I, Yevette Edwards, am acting as a scribe for Dr. Sullivan Lone.   .I have reviewed the above documentation for accuracy and completeness, and I agree with the above. Brunetta Genera MD

## 2019-09-13 ENCOUNTER — Inpatient Hospital Stay: Payer: Medicare Other

## 2019-09-13 ENCOUNTER — Other Ambulatory Visit: Payer: Self-pay

## 2019-09-13 DIAGNOSIS — Z23 Encounter for immunization: Secondary | ICD-10-CM

## 2019-10-04 DIAGNOSIS — H401133 Primary open-angle glaucoma, bilateral, severe stage: Secondary | ICD-10-CM | POA: Diagnosis not present

## 2020-01-31 DIAGNOSIS — Z0000 Encounter for general adult medical examination without abnormal findings: Secondary | ICD-10-CM | POA: Diagnosis not present

## 2020-01-31 DIAGNOSIS — Z79899 Other long term (current) drug therapy: Secondary | ICD-10-CM | POA: Diagnosis not present

## 2020-01-31 DIAGNOSIS — C9110 Chronic lymphocytic leukemia of B-cell type not having achieved remission: Secondary | ICD-10-CM | POA: Diagnosis not present

## 2020-02-07 DIAGNOSIS — N401 Enlarged prostate with lower urinary tract symptoms: Secondary | ICD-10-CM | POA: Diagnosis not present

## 2020-02-07 DIAGNOSIS — J309 Allergic rhinitis, unspecified: Secondary | ICD-10-CM | POA: Diagnosis not present

## 2020-02-07 DIAGNOSIS — D696 Thrombocytopenia, unspecified: Secondary | ICD-10-CM | POA: Diagnosis not present

## 2020-02-07 DIAGNOSIS — Z0001 Encounter for general adult medical examination with abnormal findings: Secondary | ICD-10-CM | POA: Diagnosis not present

## 2020-03-08 DIAGNOSIS — Z961 Presence of intraocular lens: Secondary | ICD-10-CM | POA: Diagnosis not present

## 2020-03-12 NOTE — Progress Notes (Signed)
[name]  HEMATOLOGY ONCOLOGY PROGRESS NOTE  Date of service:  03/13/20   Patient Care Team: [name], MD as PCP - General (Internal Medicine)  Chief Complaint:  F/u for CLL  Diagnosis: CLL  Current Treatment: Observation  INTERVAL HISTORY: Brandon Norris returns today for management and evaluation of his CLL. The patient's last visit with [place] was on 09/07/2019. The pt reports that he is doing well overall.  The pt reports no new symptoms or concerns. He notes that he feels very well. He is eating and drinking well. The pt notes no major medical or medication changes. The pt reports he had his annual physical with his PCP and noted no concerns.  Lab results today 03/13/2020 of CBC w/diff and CMP is as follows: all values are WNL except for WBC of 18.1K, RBC of 3.36, Hgb of 10.9, HCT of 33.6, Plt of 99K. 03/13/2020 LDH of 198.  On review of systems, pt denies fevers, chills, night sweats, new lumps/bumps, fatigued, decreased appetite, unexpected weight loss, abdominal pain, back pain, and any other symptoms.  REVIEW OF SYSTEMS:   10 Point review of Systems was done is negative except as noted above.    Past Medical History:  Diagnosis Date  . Actinic keratoses   . Allergic rhinitis   . Atypical nevi   . Diverticulosis   . Enlarged prostate   . Glaucoma   . Glaucoma   . H/O degenerative disc disease   . Hx of adenomatous colonic polyps   . Malignant melanoma of skin of abdomen ([place]) 2016  . Rotator cuff tendinitis   . Thrombocytopenia ([place])     . Past Surgical History:  Procedure Laterality Date  . HERNIA REPAIR    . Left inguinal herniorrhaphy    . Melanoma on abdomen  2016    . Social History   Tobacco Use  . Smoking status: Never Smoker  . Smokeless tobacco: Never Used  Vaping Use  . Vaping Use: Never used  Substance Use Topics  . Alcohol use: Yes    Alcohol/week: 3.0 standard drinks    Types: 3 Glasses of wine per week  . Drug use: No     ALLERGIES:  has No Known Allergies.  MEDICATIONS:  Current Outpatient Medications  Medication Sig Dispense Refill  . latanoprost (XALATAN) 0.005 % ophthalmic solution Place 1 drop into both eyes daily.  1  . Multiple Vitamin (MULTIVITAMIN) tablet Take 1 tablet by mouth daily.    . timolol (TIMOPTIC) 0.5 % ophthalmic solution Place 1 drop into both eyes 2 (two) times daily.   0   No current facility-administered medications for this visit.    PHYSICAL EXAMINATION: ECOG PERFORMANCE STATUS: 1 - Symptomatic but completely ambulatory  Vitals:   03/13/20 1434  BP: 124/63  Pulse: 62  Resp: 18  Temp: (!) 96 F (35.6 C)  SpO2: 100%    Filed Weights   03/13/20 1434  Weight: 134 lb 3.2 oz (60.9 kg)   .Body mass index is 21.34 kg/m.   GENERAL:alert, in no acute distress and comfortable SKIN: no acute rashes, no significant lesions EYES: conjunctiva are pink and non-injected, sclera anicteric OROPHARYNX: MMM, no exudates, no oropharyngeal erythema or ulceration NECK: supple, no JVD LYMPH:  no palpable lymphadenopathy in the cervical, axillary or inguinal regions LUNGS: clear to auscultation b/l with normal respiratory effort HEART: regular rate & rhythm ABDOMEN:  normoactive bowel sounds , non tender, not distended. Extremity: no pedal edema PSYCH: alert & oriented  x 3 with fluent speech NEURO: no focal motor/sensory deficits  LABORATORY DATA:   I have reviewed the data as listed  . CBC Latest Ref Rng & Units 03/13/2020 09/07/2019 02/15/2019  WBC 4.0 - 10.5 K/uL 18.1(H) 16.9(H) 15.7(H)  Hemoglobin 13.0 - 17.0 g/dL 10.9(L) 12.0(L) 12.1(L)  Hematocrit 39.0 - 52.0 % 33.6(L) 36.6(L) 36.9(L)  Platelets 150 - 400 K/uL 99(L) 103(L) 104(L)    . CMP Latest Ref Rng & Units 03/13/2020 09/07/2019 02/15/2019  Glucose 70 - 99 mg/dL 94 92 95  BUN 8 - 23 mg/dL 20 16 17   Creatinine 0.61 - 1.24 mg/dL 0.97 1.01 0.90  Sodium 135 - 145 mmol/L 141 142 142  Potassium 3.5 - 5.1 mmol/L 4.5 4.1 4.2   Chloride 98 - 111 mmol/L 107 105 108  CO2 22 - 32 mmol/L 29 32 28  Calcium 8.9 - 10.3 mg/dL 9.1 9.9 9.0  Total Protein 6.5 - 8.1 g/dL 7.4 7.1 7.3  Total Bilirubin 0.3 - 1.2 mg/dL 0.5 0.5 0.7  Alkaline Phos 38 - 126 U/L 67 66 69  AST 15 - 41 U/L 17 16 16   ALT 0 - 44 U/L 12 10 12    [place] ABD (01/28/2016)  ABDOMEN ULTRASOUND COMPLETE  COMPARISON:  None.  FINDINGS: Gallbladder: No gallstones or wall thickening visualized. There is no pericholecystic fluid. No sonographic Murphy sign noted by sonographer.  Common bile duct: Diameter: 2 mm. There is no intrahepatic, common hepatic, or common bile duct dilatation.  Liver: No focal lesion identified. Within normal limits in parenchymal echogenicity.  IVC: No abnormality visualized.  Pancreas: Visualized portion unremarkable. Portions of pancreas obscured by gas.  Spleen: Spleen measures 16.0 x 14.2 x 5.8 cm with a measures splenic volume of 621 cubic cm. No focal splenic lesions are evident.  Right Kidney: Length: 11.4 cm. Echogenicity within normal limits. No hydronephrosis visualized. There is a parapelvic cyst in the left kidney measuring 5.1 x 3.5 x 6.3 cm.  Left Kidney: Length: 11.1 cm. Echogenicity within normal limits. No mass or hydronephrosis visualized.  Abdominal aorta: No aneurysm visualized.  Other findings: No demonstrable ascites.  IMPRESSION: Splenomegaly.  No focal splenic lesions evident.  Parapelvic cyst right kidney measuring 5.1 x 3.5 x 6.3 cm.  Portions of pancreas obscured by gas. Visualized portions of pancreas appear normal.  Study otherwise unremarkable.   Electronically Signed   By: Lowella Grip III M.D.   On: 01/28/2016 14:09  FISH, CLL Prognostic on 08/26/16   RADIOGRAPHIC STUDIES: I have personally reviewed the radiological images as listed and agreed with the findings in the report. No results found.  ASSESSMENT & PLAN:   87 y.o. male with  1) Rai Stage 2  Chronic lymphocytic leukemia. (with splenomegaly) Trisomy 12 and a 13q deletion.  Would be stage II if splenomegaly was considered to be related to the CLL. Patient however notes that he has had splenomegaly for decades without clear etiology noted previously.  WBC counts are not changed significantly. No anemia Mild thrombocytopenia that is not significantly changed platelets around 101k.  No constitutional symptoms. No issues with infections/bleeding. No clinically palpable lymphadenopathy ( US showed enlarged spleen 16x14.2x5.8 cms)  PLAN: -Discussed pt labwork today, 03/13/2020; blood chemistries all normal, blood counts stable. Mild anemia. LDH improved. -Advised pt there have been no major changes in the last six months outside mild anemia. No concerns at this time. -Recommended Boost or Ensure supplements to aid in weight gain and maintenance. -Advise pt to continue eat  well and drink well. -Advised pt that mild drop in red cells could be due to recent infection, as the pt notes he had a sore throat a month ago. -The pt shows no overt clinical or lab progression of his CLL at this time. No indication to initiate active treatment.  -Advised pt of symptoms to be aware of including: debilitating fatigue, drenching night sweats, unexpected weight loss & abnormal bleeding. -Will see back in 4 months with labs.   2) Incidentally noted left parapelvic right kidney cyst measuring 5.1 x 3.5 x 6.3 cm in size.  Plan -Previously recommend CT scan of the abdomen and urology evaluation  by primary care physician. -Pt notes he will be establishing care with Dr. Tresa Moore in urology   3) . Patient Active Problem List   Diagnosis Date Noted  . CLL (chronic lymphocytic leukemia) ([place]) 01/21/2016  . Elevated WBC count 01/16/2016  . Glaucoma 01/16/2016  . Thrombocytopenia ([place]) 01/16/2016  . Diverticulosis 01/16/2016  . Encounter for colonoscopy due to history of adenomatous colonic polyps  01/16/2016  . Allergic rhinitis 01/16/2016   Plan -f/u with PCP for mx   FOLLOW UP: RTC with Dr Irene Limbo with labs in 4 months    The total time spent in the appointment was 20 minutes and more than 50% was on counseling and direct patient cares.   All of the patient's questions were answered with apparent satisfaction. The patient knows to call the clinic with any problems, questions or concerns.    Sullivan Lone MD [place] AAHIVMS [facility] Hematology/Oncology Physician [facility]  (Office):       [phone] (Work cell):  [phone] (Fax):           [phone]  I, Reinaldo Raddle, am acting as scribe for Dr. Sullivan Lone, MD.      .I have reviewed the above documentation for accuracy and completeness, and I agree with the above. Brunetta Genera MD

## 2020-03-13 ENCOUNTER — Ambulatory Visit: Payer: Medicare Other | Admitting: Hematology

## 2020-03-13 ENCOUNTER — Inpatient Hospital Stay: Payer: Medicare Other | Admitting: Hematology

## 2020-03-13 ENCOUNTER — Other Ambulatory Visit: Payer: Self-pay

## 2020-03-13 ENCOUNTER — Inpatient Hospital Stay: Payer: Medicare Other | Attending: Hematology

## 2020-03-13 VITALS — BP 124/63 | HR 62 | Temp 96.0°F | Resp 18 | Ht 66.5 in | Wt 134.2 lb

## 2020-03-13 DIAGNOSIS — C9110 Chronic lymphocytic leukemia of B-cell type not having achieved remission: Secondary | ICD-10-CM

## 2020-03-13 DIAGNOSIS — D649 Anemia, unspecified: Secondary | ICD-10-CM

## 2020-03-13 DIAGNOSIS — D696 Thrombocytopenia, unspecified: Secondary | ICD-10-CM | POA: Insufficient documentation

## 2020-03-13 LAB — CBC WITH DIFFERENTIAL/PLATELET
Abs Immature Granulocytes: 0 10*3/uL (ref 0.00–0.07)
Basophils Absolute: 0 10*3/uL (ref 0.0–0.1)
Basophils Relative: 0 %
Eosinophils Absolute: 0 10*3/uL (ref 0.0–0.5)
Eosinophils Relative: 0 %
HCT: 33.6 % — ABNORMAL LOW (ref 39.0–52.0)
Hemoglobin: 10.9 g/dL — ABNORMAL LOW (ref 13.0–17.0)
Lymphocytes Relative: 84 %
Lymphs Abs: 15.2 10*3/uL — ABNORMAL HIGH (ref 0.7–4.0)
MCH: 32.4 pg (ref 26.0–34.0)
MCHC: 32.4 g/dL (ref 30.0–36.0)
MCV: 100 fL (ref 80.0–100.0)
Monocytes Absolute: 0 10*3/uL — ABNORMAL LOW (ref 0.1–1.0)
Monocytes Relative: 0 %
Neutro Abs: 2.9 10*3/uL (ref 1.7–7.7)
Neutrophils Relative %: 16 %
Platelets: 99 10*3/uL — ABNORMAL LOW (ref 150–400)
RBC: 3.36 MIL/uL — ABNORMAL LOW (ref 4.22–5.81)
RDW: 14.6 % (ref 11.5–15.5)
WBC: 18.1 10*3/uL — ABNORMAL HIGH (ref 4.0–10.5)
nRBC: 0 % (ref 0.0–0.2)

## 2020-03-13 LAB — CMP (CANCER CENTER ONLY)
ALT: 12 U/L (ref 0–44)
AST: 17 U/L (ref 15–41)
Albumin: 3.9 g/dL (ref 3.5–5.0)
Alkaline Phosphatase: 67 U/L (ref 38–126)
Anion gap: 5 (ref 5–15)
BUN: 20 mg/dL (ref 8–23)
CO2: 29 mmol/L (ref 22–32)
Calcium: 9.1 mg/dL (ref 8.9–10.3)
Chloride: 107 mmol/L (ref 98–111)
Creatinine: 0.97 mg/dL (ref 0.61–1.24)
GFR, Estimated: >60 mL/min (ref >60)
Glucose, Bld: 94 mg/dL (ref 70–99)
Potassium: 4.5 mmol/L (ref 3.5–5.1)
Sodium: 141 mmol/L (ref 135–145)
Total Bilirubin: 0.5 mg/dL (ref 0.3–1.2)
Total Protein: 7.4 g/dL (ref 6.5–8.1)

## 2020-03-13 LAB — LACTATE DEHYDROGENASE: LDH: 198 U/L — ABNORMAL HIGH (ref 98–192)

## 2020-03-15 ENCOUNTER — Telehealth: Payer: Self-pay | Admitting: Hematology

## 2020-03-15 NOTE — Telephone Encounter (Signed)
Scheduled per 03/08 los, patient is notified of upcoming appointments.

## 2020-03-28 DIAGNOSIS — L821 Other seborrheic keratosis: Secondary | ICD-10-CM | POA: Diagnosis not present

## 2020-03-28 DIAGNOSIS — Z85828 Personal history of other malignant neoplasm of skin: Secondary | ICD-10-CM | POA: Diagnosis not present

## 2020-03-28 DIAGNOSIS — D1801 Hemangioma of skin and subcutaneous tissue: Secondary | ICD-10-CM | POA: Diagnosis not present

## 2020-03-28 DIAGNOSIS — D692 Other nonthrombocytopenic purpura: Secondary | ICD-10-CM | POA: Diagnosis not present

## 2020-04-04 DIAGNOSIS — H43813 Vitreous degeneration, bilateral: Secondary | ICD-10-CM | POA: Diagnosis not present

## 2020-04-04 DIAGNOSIS — H35373 Puckering of macula, bilateral: Secondary | ICD-10-CM | POA: Diagnosis not present

## 2020-04-04 DIAGNOSIS — H401133 Primary open-angle glaucoma, bilateral, severe stage: Secondary | ICD-10-CM | POA: Diagnosis not present

## 2020-04-04 DIAGNOSIS — H0100A Unspecified blepharitis right eye, upper and lower eyelids: Secondary | ICD-10-CM | POA: Diagnosis not present

## 2020-05-09 DIAGNOSIS — R600 Localized edema: Secondary | ICD-10-CM | POA: Diagnosis not present

## 2020-05-14 DIAGNOSIS — D649 Anemia, unspecified: Secondary | ICD-10-CM | POA: Diagnosis not present

## 2020-05-14 DIAGNOSIS — R600 Localized edema: Secondary | ICD-10-CM | POA: Diagnosis not present

## 2020-05-15 ENCOUNTER — Other Ambulatory Visit: Payer: Self-pay | Admitting: Internal Medicine

## 2020-05-15 DIAGNOSIS — R600 Localized edema: Secondary | ICD-10-CM

## 2020-05-16 ENCOUNTER — Ambulatory Visit
Admission: RE | Admit: 2020-05-16 | Discharge: 2020-05-16 | Disposition: A | Discharge status: Home / Self Care | Payer: Medicare Other | Source: Ambulatory Visit | Attending: Internal Medicine | Admitting: Internal Medicine

## 2020-05-16 DIAGNOSIS — R600 Localized edema: Secondary | ICD-10-CM

## 2020-07-10 ENCOUNTER — Ambulatory Visit: Payer: Medicare Other | Admitting: Hematology

## 2020-07-10 ENCOUNTER — Other Ambulatory Visit: Payer: Medicare Other

## 2020-07-11 NOTE — Progress Notes (Signed)
[name]  HEMATOLOGY ONCOLOGY PROGRESS NOTE  Date of service:  07/12/20   Patient Care Team: [name], MD as PCP - General (Internal Medicine)  Chief Complaint:  F/u for CLL  Diagnosis: CLL  Current Treatment: Observation  INTERVAL HISTORY: Brandon Norris returns today for management and evaluation of his CLL. The patient's last visit with Brandon Norris was on 03/13/2020. The pt reports that he is doing well overall.  The pt reports that he has been having some weakness when he first wakes up in his upper legs that has been gradually presenting itself over the last year. It gets better with activity throughout the day but is more consistent still. He walks 10-15 minutes each morning while his wife is still sleeping. He currently takes a multivitamin and Vitamin D each day. He has been eating and sleeping better, which has helped his weight increase again.  Last month the pt notes he was experiencing some shin pain and swelling. His PCP got an Brandon Norris to ensure no blood clots that came back negative. This is gradually been improving.  Lab results today 07/12/2020 of CBC w/diff and CMP is as follows: all values are WNL except for WBC of 16.4K, RBC of 3.16, Hgb of 10.7, HCT of 32.5, MCV of 102.8, Plt of 105K. 07/12/2020 Retic Ct Pct of 1.8, Immature Retic Fract of 10.6. 07/12/2020 Haptoglobin wnl at 118 07/12/2020 Vitamin B12 223 07/12/2020 LDH stable at 196  On review of systems, pt reports muscle weakness and denies weight loss, decreased appetite, new lumps/bumps, acute skin rashes, abdominal pain, back pain, acute SOB, chest pain, leg swelling, and any other symptoms.   REVIEW OF SYSTEMS:   10 Point review of Systems was done is negative except as noted above.    Past Medical History:  Diagnosis Date   Actinic keratoses    Allergic rhinitis    Atypical nevi    Diverticulosis    Enlarged prostate    Glaucoma    Glaucoma    H/O degenerative disc disease    Hx of adenomatous colonic  polyps    Malignant melanoma of skin of abdomen ([place]) 2016   Rotator cuff tendinitis    Thrombocytopenia ([place])     . Past Surgical History:  Procedure Laterality Date   HERNIA REPAIR     Left inguinal herniorrhaphy     Melanoma on abdomen  2016     Social History   Tobacco Use   Smoking status: Never   Smokeless tobacco: Never  Vaping Use   Vaping Use: Never used  Substance Use Topics   Alcohol use: Yes    Alcohol/week: 3.0 standard drinks    Types: 3 Glasses of wine per week   Drug use: No    ALLERGIES:  has No Known Allergies.  MEDICATIONS:  Current Outpatient Medications  Medication Sig Dispense Refill   Cholecalciferol (VITAMIN D3) 50 MCG (2000 UT) TABS Take 1 tablet by mouth daily at 2 PM.     dorzolamide-timolol (COSOPT) 22.3-6.8 MG/ML ophthalmic solution 1 drop 2 (two) times daily.     latanoprost (XALATAN) 0.005 % ophthalmic solution 1 drop into BOTH  eye in the evening     Multiple Vitamin (MULTIVITAMINS PO)      ID NOW COVID-19 KIT TEST AS DIRECTED TODAY     timolol (BETIMOL) 0.25 % ophthalmic solution 1 drop into BOTH EYES (Patient not taking: Reported on 07/12/2020)     timolol (TIMOPTIC) 0.5 % ophthalmic solution Place 1 drop into  both eyes 2 (two) times daily. (Patient not taking: Reported on 07/12/2020)  0   Zinc 50 MG TABS Take 1 tablet by mouth daily at 2 PM. (Patient not taking: Reported on 07/12/2020)     No current facility-administered medications for this visit.    PHYSICAL EXAMINATION: ECOG PERFORMANCE STATUS: 1 - Symptomatic but completely ambulatory  Vitals:   07/12/20 1421  BP: (!) 119/49  Pulse: (!) 53  Resp: 18  Temp: 97.6 F (36.4 C)  SpO2: 99%     Filed Weights   07/12/20 1421  Weight: 136 lb 3.2 oz (61.8 kg)    .Body mass index is 21.65 kg/m.    GENERAL:alert, in no acute distress and comfortable SKIN: no acute rashes, no significant lesions EYES: conjunctiva are pink and non-injected, sclera anicteric OROPHARYNX: MMM,  no exudates, no oropharyngeal erythema or ulceration NECK: supple, no JVD LYMPH:  no palpable lymphadenopathy in the cervical, axillary or inguinal regions. Spleen 1-2 cm below coastal margin when taking deep breath. LUNGS: clear to auscultation b/l with normal respiratory effort HEART: regular rate & rhythm ABDOMEN:  normoactive bowel sounds , non tender, not distended. Extremity: no pedal edema PSYCH: alert & oriented x 3 with fluent speech NEURO: no focal motor/sensory deficits  LABORATORY DATA:   I have reviewed the data as listed  . CBC Latest Ref Rng & Units 07/12/2020 03/13/2020 09/07/2019  WBC 4.0 - 10.5 K/uL 16.4(H) 18.1(H) 16.9(H)  Hemoglobin 13.0 - 17.0 g/dL 10.7(L) 10.9(L) 12.0(L)  Hematocrit 39.0 - 52.0 % 32.5(L) 33.6(L) 36.6(L)  Platelets 150 - 400 K/uL 105(L) 99(L) 103(L)    . CMP Latest Ref Rng & Units 07/12/2020 03/13/2020 09/07/2019  Glucose 70 - 99 mg/dL 99 94 92  BUN 8 - 23 mg/dL _0 Creatinine 0.61 - 1.24 mg/dL 1.02 0.97 1.01  Sodium 135 - 145 mmol/L 142 141 142  Potassium 3.5 - 5.1 mmol/L 4.0 4.5 4.1  Chloride 98 - 111 mmol/L 106 107 105  CO2 22 - 32 mmol/L 29 29 32  Calcium 8.9 - 10.3 mg/dL 9.1 9.1 9.9  Total Protein 6.5 - 8.1 g/dL 7.1 7.4 7.1  Total Bilirubin 0.3 - 1.2 mg/dL 0.6 0.5 0.5  Alkaline Phos 38 - 126 U/L 74 67 66  AST 15 - 41 U/L _1 ALT 0 - 44 U/L _2 Brandon Norris ABD (01/28/2016)  ABDOMEN ULTRASOUND COMPLETE   COMPARISON:  None.   FINDINGS: Gallbladder: No gallstones or wall thickening visualized. There is no pericholecystic fluid. No sonographic Murphy sign noted by sonographer.   Common bile duct: Diameter: 2 mm. There is no intrahepatic, common hepatic, or common bile duct dilatation.   Liver: No focal lesion identified. Within normal limits in parenchymal echogenicity.   IVC: No abnormality visualized.   Pancreas: Visualized portion unremarkable. Portions of pancreas obscured by gas.   Spleen: Spleen measures 16.0 x 14.2  x 5.8 cm with a measures splenic volume of 621 cubic cm. No focal splenic lesions are evident.   Right Kidney: Length: 11.4 cm. Echogenicity within normal limits. No hydronephrosis visualized. There is a parapelvic cyst in the left kidney measuring 5.1 x 3.5 x 6.3 cm.   Left Kidney: Length: 11.1 cm. Echogenicity within normal limits. No mass or hydronephrosis visualized.   Abdominal aorta: No aneurysm visualized.   Other findings: No demonstrable ascites.   IMPRESSION: Splenomegaly.  No focal splenic lesions evident.   Parapelvic cyst right kidney measuring 5.1 x  3.5 x 6.3 cm.   Portions of pancreas obscured by gas. Visualized portions of pancreas appear normal.   Study otherwise unremarkable.     Electronically Signed   By: Lowella Grip III M.D.   On: 01/28/2016 14:09  FISH, CLL Prognostic on 08/26/16   RADIOGRAPHIC STUDIES: I have personally reviewed the radiological images as listed and agreed with the findings in the report. No results found.  ASSESSMENT & PLAN:   87 y.o. male with  1) Rai Stage 2 Chronic lymphocytic leukemia. (with splenomegaly) Trisomy 12 and a 13q deletion.  Would be stage II if splenomegaly was considered to be related to the CLL. Patient however notes that he has had splenomegaly for decades without clear etiology noted previously.  WBC counts are not changed significantly. No anemia Mild thrombocytopenia that is not significantly changed platelets around 101k.  No constitutional symptoms. No issues with infections/bleeding. No clinically palpable lymphadenopathy ( Brandon Norris showed enlarged spleen 16x14.2x5.8 cms)  PLAN: -Discussed pt labwork today, 07/12/2020; reticulocytes normal, counts stable. Red cells slightly larger in size. LDH and haptoglobin - so no overt hemolysis. -Advised pt that he could add high calorie, high protein Boost or Ensure 1-2x between meals, but not as meal replacements. -Advised pt that the anemia is not  concerning at this time given the pt's age. -B12 -- slightly low 233. Recommended pt start a Vitamin B Complex once daily. He can take this in addition to or in replacement of his current multivitamin. -At this time, pt does not meet any criteria for initiating treatment for CLL. -The pt shows no overt clinical or lab progression of his CLL at this time. No indication to initiate active treatment.  -Advised pt of symptoms to be aware of including: debilitating fatigue, drenching night sweats, unexpected weight loss & abnormal bleeding. -Will see back in 6 months with labs.   2) Incidentally noted left parapelvic right kidney cyst measuring 5.1 x 3.5 x 6.3 cm in size.  Plan -Previously recommend CT scan of the abdomen and urology evaluation  by primary care physician. -Pt notes he will be establishing care with Dr. Tresa Moore in urology   3) . Patient Active Problem List   Diagnosis Date Noted   CLL (chronic lymphocytic leukemia) ([place]) 01/21/2016   Elevated WBC count 01/16/2016   Glaucoma 01/16/2016   Thrombocytopenia ([place]) 01/16/2016   Diverticulosis 01/16/2016   Encounter for colonoscopy due to history of adenomatous colonic polyps 01/16/2016   Allergic rhinitis 01/16/2016   Plan -f/u with PCP for mx   FOLLOW UP: RTC with Dr Irene Limbo with labs in 6 months    The total time spent in the appointment was 25 minutes and more than 50% was on counseling and direct patient cares.   All of the patient's questions were answered with apparent satisfaction. The patient knows to call the clinic with any problems, questions or concerns.    Sullivan Lone MD [place] AAHIVMS [facility] Hematology/Oncology Physician [facility]  (Office):       [phone] (Work cell):  [phone] (Fax):           [phone]  I, Reinaldo Raddle, am acting as scribe for Dr. Sullivan Lone, MD.   .I have reviewed the above documentation for accuracy and completeness, and I agree with the above. Brunetta Genera MD

## 2020-07-12 ENCOUNTER — Other Ambulatory Visit: Payer: Self-pay

## 2020-07-12 ENCOUNTER — Inpatient Hospital Stay: Payer: Medicare Other | Attending: Hematology

## 2020-07-12 ENCOUNTER — Inpatient Hospital Stay (HOSPITAL_BASED_OUTPATIENT_CLINIC_OR_DEPARTMENT_OTHER): Payer: Medicare Other | Admitting: Hematology

## 2020-07-12 VITALS — BP 119/49 | HR 53 | Temp 97.6°F | Resp 18 | Wt 136.2 lb

## 2020-07-12 DIAGNOSIS — D696 Thrombocytopenia, unspecified: Secondary | ICD-10-CM | POA: Diagnosis not present

## 2020-07-12 DIAGNOSIS — R161 Splenomegaly, not elsewhere classified: Secondary | ICD-10-CM | POA: Diagnosis not present

## 2020-07-12 DIAGNOSIS — C9110 Chronic lymphocytic leukemia of B-cell type not having achieved remission: Secondary | ICD-10-CM

## 2020-07-12 DIAGNOSIS — D649 Anemia, unspecified: Secondary | ICD-10-CM

## 2020-07-12 LAB — CMP (CANCER CENTER ONLY)
ALT: 12 U/L (ref 0–44)
AST: 16 U/L (ref 15–41)
Albumin: 3.7 g/dL (ref 3.5–5.0)
Alkaline Phosphatase: 74 U/L (ref 38–126)
Anion gap: 7 (ref 5–15)
BUN: 18 mg/dL (ref 8–23)
CO2: 29 mmol/L (ref 22–32)
Calcium: 9.1 mg/dL (ref 8.9–10.3)
Chloride: 106 mmol/L (ref 98–111)
Creatinine: 1.02 mg/dL (ref 0.61–1.24)
GFR, Estimated: >60 mL/min (ref >60)
Glucose, Bld: 99 mg/dL (ref 70–99)
Potassium: 4 mmol/L (ref 3.5–5.1)
Sodium: 142 mmol/L (ref 135–145)
Total Bilirubin: 0.6 mg/dL (ref 0.3–1.2)
Total Protein: 7.1 g/dL (ref 6.5–8.1)

## 2020-07-12 LAB — VITAMIN B12: Vitamin B-12: 233 pg/mL (ref 180–914)

## 2020-07-12 LAB — CBC WITH DIFFERENTIAL/PLATELET
Abs Immature Granulocytes: 0 10*3/uL (ref 0.00–0.07)
Basophils Absolute: 0 10*3/uL (ref 0.0–0.1)
Basophils Relative: 0 %
Eosinophils Absolute: 0.3 10*3/uL (ref 0.0–0.5)
Eosinophils Relative: 2 %
HCT: 32.5 % — ABNORMAL LOW (ref 39.0–52.0)
Hemoglobin: 10.7 g/dL — ABNORMAL LOW (ref 13.0–17.0)
Lymphocytes Relative: 80 %
Lymphs Abs: 13.1 10*3/uL — ABNORMAL HIGH (ref 0.7–4.0)
MCH: 33.9 pg (ref 26.0–34.0)
MCHC: 32.9 g/dL (ref 30.0–36.0)
MCV: 102.8 fL — ABNORMAL HIGH (ref 80.0–100.0)
Monocytes Absolute: 0.3 10*3/uL (ref 0.1–1.0)
Monocytes Relative: 2 %
Neutro Abs: 2.6 10*3/uL (ref 1.7–7.7)
Neutrophils Relative %: 16 %
Platelets: 105 10*3/uL — ABNORMAL LOW (ref 150–400)
RBC: 3.16 MIL/uL — ABNORMAL LOW (ref 4.22–5.81)
RDW: 14.7 % (ref 11.5–15.5)
WBC: 16.4 10*3/uL — ABNORMAL HIGH (ref 4.0–10.5)
nRBC: 0 % (ref 0.0–0.2)

## 2020-07-12 LAB — RETICULOCYTES
Immature Retic Fract: 10.6 % (ref 2.3–15.9)
RBC.: 3.13 MIL/uL — ABNORMAL LOW (ref 4.22–5.81)
Retic Count, Absolute: 57 10*3/uL (ref 19.0–186.0)
Retic Ct Pct: 1.8 % (ref 0.4–3.1)

## 2020-07-12 LAB — LACTATE DEHYDROGENASE: LDH: 196 U/L — ABNORMAL HIGH (ref 98–192)

## 2020-07-13 LAB — HAPTOGLOBIN: Haptoglobin: 118 mg/dL (ref 38–329)

## 2020-08-14 DIAGNOSIS — H35373 Puckering of macula, bilateral: Secondary | ICD-10-CM | POA: Diagnosis not present

## 2020-08-14 DIAGNOSIS — H401133 Primary open-angle glaucoma, bilateral, severe stage: Secondary | ICD-10-CM | POA: Diagnosis not present

## 2020-08-16 DIAGNOSIS — R058 Other specified cough: Secondary | ICD-10-CM | POA: Diagnosis not present

## 2020-08-16 DIAGNOSIS — M545 Low back pain, unspecified: Secondary | ICD-10-CM | POA: Diagnosis not present

## 2020-08-16 DIAGNOSIS — R630 Anorexia: Secondary | ICD-10-CM | POA: Diagnosis not present

## 2020-08-16 DIAGNOSIS — R634 Abnormal weight loss: Secondary | ICD-10-CM | POA: Diagnosis not present

## 2020-09-12 ENCOUNTER — Other Ambulatory Visit: Payer: Self-pay

## 2020-09-12 DIAGNOSIS — C9110 Chronic lymphocytic leukemia of B-cell type not having achieved remission: Secondary | ICD-10-CM

## 2020-09-13 ENCOUNTER — Other Ambulatory Visit: Payer: Self-pay

## 2020-09-13 ENCOUNTER — Inpatient Hospital Stay: Payer: Medicare Other | Attending: Hematology | Admitting: Hematology

## 2020-09-13 ENCOUNTER — Inpatient Hospital Stay: Payer: Medicare Other

## 2020-09-13 VITALS — BP 104/51 | HR 63 | Temp 97.9°F | Resp 18 | Wt 125.1 lb

## 2020-09-13 DIAGNOSIS — R634 Abnormal weight loss: Secondary | ICD-10-CM | POA: Diagnosis not present

## 2020-09-13 DIAGNOSIS — N179 Acute kidney failure, unspecified: Secondary | ICD-10-CM

## 2020-09-13 DIAGNOSIS — C9110 Chronic lymphocytic leukemia of B-cell type not having achieved remission: Secondary | ICD-10-CM

## 2020-09-13 DIAGNOSIS — R630 Anorexia: Secondary | ICD-10-CM | POA: Diagnosis not present

## 2020-09-13 DIAGNOSIS — D539 Nutritional anemia, unspecified: Secondary | ICD-10-CM | POA: Insufficient documentation

## 2020-09-13 LAB — CBC WITH DIFFERENTIAL (CANCER CENTER ONLY)
Abs Immature Granulocytes: 0 10*3/uL (ref 0.00–0.07)
Basophils Absolute: 0.2 10*3/uL — ABNORMAL HIGH (ref 0.0–0.1)
Basophils Relative: 1 %
Eosinophils Absolute: 0.4 10*3/uL (ref 0.0–0.5)
Eosinophils Relative: 2 %
HCT: 30.3 % — ABNORMAL LOW (ref 39.0–52.0)
Hemoglobin: 9.7 g/dL — ABNORMAL LOW (ref 13.0–17.0)
Lymphocytes Relative: 86 %
Lymphs Abs: 17.4 10*3/uL — ABNORMAL HIGH (ref 0.7–4.0)
MCH: 31.6 pg (ref 26.0–34.0)
MCHC: 32 g/dL (ref 30.0–36.0)
MCV: 98.7 fL (ref 80.0–100.0)
Monocytes Absolute: 0.4 10*3/uL (ref 0.1–1.0)
Monocytes Relative: 2 %
Neutro Abs: 1.8 10*3/uL (ref 1.7–7.7)
Neutrophils Relative %: 9 %
Platelet Count: 158 10*3/uL (ref 150–400)
RBC: 3.07 MIL/uL — ABNORMAL LOW (ref 4.22–5.81)
RDW: 15.3 % (ref 11.5–15.5)
WBC Count: 20.2 10*3/uL — ABNORMAL HIGH (ref 4.0–10.5)
nRBC: 0 % (ref 0.0–0.2)

## 2020-09-13 LAB — CMP (CANCER CENTER ONLY)
ALT: 17 U/L (ref 0–44)
AST: 18 U/L (ref 15–41)
Albumin: 3.3 g/dL — ABNORMAL LOW (ref 3.5–5.0)
Alkaline Phosphatase: 78 U/L (ref 38–126)
Anion gap: 10 (ref 5–15)
BUN: 38 mg/dL — ABNORMAL HIGH (ref 8–23)
CO2: 30 mmol/L (ref 22–32)
Calcium: 12 mg/dL — ABNORMAL HIGH (ref 8.9–10.3)
Chloride: 105 mmol/L (ref 98–111)
Creatinine: 1.84 mg/dL — ABNORMAL HIGH (ref 0.61–1.24)
GFR, Estimated: 35 mL/min — ABNORMAL LOW (ref >60)
Glucose, Bld: 104 mg/dL — ABNORMAL HIGH (ref 70–99)
Potassium: 3.7 mmol/L (ref 3.5–5.1)
Sodium: 145 mmol/L (ref 135–145)
Total Bilirubin: 0.4 mg/dL (ref 0.3–1.2)
Total Protein: 6.5 g/dL (ref 6.5–8.1)

## 2020-09-13 LAB — RETIC PANEL
Immature Retic Fract: 6.8 % (ref 2.3–15.9)
RBC.: 3.08 MIL/uL — ABNORMAL LOW (ref 4.22–5.81)
Retic Count, Absolute: 49.9 10*3/uL (ref 19.0–186.0)
Retic Ct Pct: 1.6 % (ref 0.4–3.1)
Reticulocyte Hemoglobin: 34.8 pg (ref >27.9)

## 2020-09-13 LAB — VITAMIN D 25 HYDROXY (VIT D DEFICIENCY, FRACTURES): Vit D, 25-Hydroxy: 86.96 ng/mL (ref 30–100)

## 2020-09-13 LAB — FERRITIN: Ferritin: 322 ng/mL (ref 24–336)

## 2020-09-13 LAB — LACTATE DEHYDROGENASE: LDH: 226 U/L — ABNORMAL HIGH (ref 98–192)

## 2020-09-13 LAB — VITAMIN B12: Vitamin B-12: 281 pg/mL (ref 180–914)

## 2020-09-13 MED ORDER — PREDNISONE 20 MG PO TABS: 40.0000 mg | ORAL_TABLET | Freq: Every day | ORAL | 0 refills | Status: AC

## 2020-09-13 NOTE — Progress Notes (Signed)
[name]  HEMATOLOGY ONCOLOGY PROGRESS NOTE  Date of service:  09/13/20   Patient Care Team: [name], MD as PCP - General (Internal Medicine)  Chief Complaint:  F/u for CLL  Diagnosis: CLL  Current Treatment: Observation  INTERVAL HISTORY:  Brandon Norris returns today for an earlier than planned follow-up due to significant increase in fatigue decreased appetite and weight loss of about 11 pounds since his last visit on 07/12/2020 .  He reports that since his last visit with [place] he has had significant decline in appetite generalized fatigue with decreased functional capacity.  Poor p.o. intake.  Poor fluid intake. No other overt focal new symptoms.  No fevers no chills no night sweats no new lumps or bumps.  Does endorse significant grade 2-3 fatigue and some generalized muscle weakness likely from his new hypercalcemia. No new focal bone pains  He does report a new persistent cough which is nonproductive.  He notes that he was placed on Pepcid for acid reflux with minimal improvement in his cough. No hemoptysis. No overt shortness of breath or chest pain.   Lab results today 09/13/2020 of  CBC w/diff shows some increase in his anemia with a hemoglobin of 9.7.  WBC count minimally higher at 20.2k and improved platelets at 158k CMP shows new acute kidney injury with a BUN of 38 and a creatinine of 1.84 and newly noted significant hypercalcemia with a calcium level of 12.  Patient was recommended to go to the emergency room for further evaluation and management of his hypercalcemia and acute kidney injury but politely refused since his wife who has dementia is unremarkable and he does not have anyone to look after her at this time.  With his permission and after discussion he was set up for IV fluids and Xgeva tomorrow for possible malignancy associated hypercalcemia and was also started on oral prednisone.    REVIEW OF SYSTEMS:   10 Point review of Systems was done is  negative except as noted above.    Past Medical History:  Diagnosis Date   Actinic keratoses    Allergic rhinitis    Atypical nevi    Diverticulosis    Enlarged prostate    Glaucoma    Glaucoma    H/O degenerative disc disease    Hx of adenomatous colonic polyps    Malignant melanoma of skin of abdomen ([place]) 2016   Rotator cuff tendinitis    Thrombocytopenia ([place])     . Past Surgical History:  Procedure Laterality Date   HERNIA REPAIR     Left inguinal herniorrhaphy     Melanoma on abdomen  2016     Social History   Tobacco Use   Smoking status: Never   Smokeless tobacco: Never  Vaping Use   Vaping Use: Never used  Substance Use Topics   Alcohol use: Yes    Alcohol/week: 3.0 standard drinks    Types: 3 Glasses of wine per week   Drug use: No    ALLERGIES:  has No Known Allergies.  MEDICATIONS:  Current Outpatient Medications  Medication Sig Dispense Refill   Cholecalciferol (VITAMIN D3) 50 MCG (2000 UT) TABS Take 1 tablet by mouth daily at 2 PM.     dorzolamide-timolol (COSOPT) 22.3-6.8 MG/ML ophthalmic solution 1 drop 2 (two) times daily.     latanoprost (XALATAN) 0.005 % ophthalmic solution 1 drop into BOTH  eye in the evening     Multiple Vitamin (MULTIVITAMINS PO)  predniSONE (DELTASONE) 20 MG tablet Take 2 tablets (40 mg total) by mouth daily with breakfast for 5 days. 10 tablet 0   ID NOW COVID-19 KIT TEST AS DIRECTED TODAY     timolol (BETIMOL) 0.25 % ophthalmic solution 1 drop into BOTH EYES (Patient not taking: No sig reported)     timolol (TIMOPTIC) 0.5 % ophthalmic solution Place 1 drop into both eyes 2 (two) times daily. (Patient not taking: No sig reported)  0   Zinc 50 MG TABS Take 1 tablet by mouth daily at 2 PM. (Patient not taking: No sig reported)     No current facility-administered medications for this visit.    PHYSICAL EXAMINATION: ECOG PERFORMANCE STATUS: 1 - Symptomatic but completely ambulatory  Vitals:   09/13/20 1206  BP:  (!) 104/51  Pulse: 63  Resp: 18  Temp: 97.9 F (36.6 C)  SpO2: 96%     Filed Weights   09/13/20 1206  Weight: 125 lb 1.6 oz (56.7 kg)    .Body mass index is 19.89 kg/m.    GENERAL:alert, in no acute distress and comfortable SKIN: no acute rashes, no significant lesions EYES: conjunctiva are pink and non-injected, sclera anicteric OROPHARYNX: MMM, no exudates, no oropharyngeal erythema or ulceration NECK: supple, no JVD LYMPH:  no palpable lymphadenopathy in the cervical, axillary or inguinal regions. Spleen 1-2 cm below coastal margin when taking deep breath. LUNGS: clear to auscultation b/l with normal respiratory effort HEART: regular rate & rhythm ABDOMEN:  normoactive bowel sounds , non tender, not distended. Extremity: no pedal edema PSYCH: alert & oriented x 3 with fluent speech NEURO: no focal motor/sensory deficits  LABORATORY DATA:   I have reviewed the data as listed  . CBC Latest Ref Rng & Units 09/13/2020 07/12/2020 03/13/2020  WBC 4.0 - 10.5 K/uL 20.2(H) 16.4(H) 18.1(H)  Hemoglobin 13.0 - 17.0 g/dL 9.7(L) 10.7(L) 10.9(L)  Hematocrit 39.0 - 52.0 % 30.3(L) 32.5(L) 33.6(L)  Platelets 150 - 400 K/uL 158 105(L) 99(L)    . CMP Latest Ref Rng & Units 09/13/2020 07/12/2020 03/13/2020  Glucose 70 - 99 mg/dL 104(H) 99 94  BUN 8 - 23 mg/dL 38(H) 18 20  Creatinine 0.61 - 1.24 mg/dL 1.84(H) 1.02 0.97  Sodium 135 - 145 mmol/L 145 142 141  Potassium 3.5 - 5.1 mmol/L 3.7 4.0 4.5  Chloride 98 - 111 mmol/L 105 106 107  CO2 22 - 32 mmol/L _0 Calcium 8.9 - 10.3 mg/dL 12.0(H) 9.1 9.1  Total Protein 6.5 - 8.1 g/dL 6.5 7.1 7.4  Total Bilirubin 0.3 - 1.2 mg/dL 0.4 0.6 0.5  Alkaline Phos 38 - 126 U/L 78 74 67  AST 15 - 41 U/L _1 ALT 0 - 44 U/L _2 Component     Latest Ref Rng & Units 09/13/2020  Retic Ct Pct     0.4 - 3.1 % 1.6  RBC.     4.22 - 5.81 MIL/uL 3.08 (L)  Retic Count, Absolute     19.0 - 186.0 K/uL 49.9  Immature Retic Fract     2.3 - 15.9 %  6.8  Reticulocyte Hemoglobin     >27.9 pg 34.8  Vitamin B12     180 - 914 pg/mL 281  LDH     98 - 192 U/L 226 (H)  Ferritin     24 - 336 ng/mL 322   [place] ABD (01/28/2016)  ABDOMEN ULTRASOUND COMPLETE   COMPARISON:  None.  FINDINGS: Gallbladder: No gallstones or wall thickening visualized. There is no pericholecystic fluid. No sonographic Murphy sign noted by sonographer.   Common bile duct: Diameter: 2 mm. There is no intrahepatic, common hepatic, or common bile duct dilatation.   Liver: No focal lesion identified. Within normal limits in parenchymal echogenicity.   IVC: No abnormality visualized.   Pancreas: Visualized portion unremarkable. Portions of pancreas obscured by gas.   Spleen: Spleen measures 16.0 x 14.2 x 5.8 cm with a measures splenic volume of 621 cubic cm. No focal splenic lesions are evident.   Right Kidney: Length: 11.4 cm. Echogenicity within normal limits. No hydronephrosis visualized. There is a parapelvic cyst in the left kidney measuring 5.1 x 3.5 x 6.3 cm.   Left Kidney: Length: 11.1 cm. Echogenicity within normal limits. No mass or hydronephrosis visualized.   Abdominal aorta: No aneurysm visualized.   Other findings: No demonstrable ascites.   IMPRESSION: Splenomegaly.  No focal splenic lesions evident.   Parapelvic cyst right kidney measuring 5.1 x 3.5 x 6.3 cm.   Portions of pancreas obscured by gas. Visualized portions of pancreas appear normal.   Study otherwise unremarkable.     Electronically Signed   By: Lowella Grip III M.D.   On: 01/28/2016 14:09  FISH, CLL Prognostic on 08/26/16   RADIOGRAPHIC STUDIES: I have personally reviewed the radiological images as listed and agreed with the findings in the report. No results found.  ASSESSMENT & PLAN:   87 y.o. male with  1) Rai Stage 2 Chronic lymphocytic leukemia. (with splenomegaly) Trisomy 12 and a 13q deletion.  Would be stage II if splenomegaly was considered  to be related to the CLL. Patient however notes that he has had splenomegaly for decades without clear etiology noted previously.  WBC counts are not changed significantly. No anemia Mild thrombocytopenia that is not significantly changed platelets around 101k.  No constitutional symptoms. No issues with infections/bleeding. No clinically palpable lymphadenopathy ( US showed enlarged spleen 16x14.2x5.8 cms)  2) new worsening grade 2-3 fatigue  3) anorexia with weight loss of 11 pounds over the last 2 months.  4) new hypercalcemia with calcium of 12 with acute kidney injury with a creatinine of 1.84.  5) macrocytic anemia B12 borderline low at 281 up from 233. Has been taking B complex 1 capsule daily Would recommend taking additional B12 1000 mcg daily over-the-counter.  PLAN: -Discussed pt labwork today, 09/13/2020; Lab results today 09/13/2020 of  CBC w/diff shows some increase in his anemia with a hemoglobin of 9.7.  WBC count minimally higher at 20.2k and improved platelets at 158k CMP shows new acute kidney injury with a BUN of 38 and a creatinine of 1.84 and newly noted significant hypercalcemia with a calcium level of 12. LDH is not significantly elevated and bilirubin levels is within normal limits that decreases the chance of significant hemolysis.  Patient was recommended to go to the emergency room for further evaluation and management of his hypercalcemia and acute kidney injury but politely refused since his wife who has dementia is unremarkable and he does not have anyone to look after her at this time.  With his permission and after discussion he was set up for IV fluids and Xgeva tomorrow for possible malignancy associated hypercalcemia and was also started on oral prednisone.  -Sent prescription for prednisone 40 mg p.o. daily for 5 days to try to help with his hypercalcemia his appetite. -Schedule the patient for 1 L of normal saline for IV  fluids and first dose of  Xgeva tomorrow. -Hypercalcemia work-up sent out as noted below -CT chest abdomen pelvis without contrast to evaluate for CLL progression and to rule out lung lesion or bone lesions. -whole-body bone scan to evaluate for bone lesions  . Orders Placed This Encounter  Procedures   CT CHEST ABDOMEN PELVIS WO CONTRAST    Standing Status:   Future    Standing Expiration Date:   09/13/2021    Order Specific Question:   If indicated for the ordered procedure, I authorize the administration of contrast media per Radiology protocol    Answer:   Yes    Order Specific Question:   Preferred imaging location?    Answer:   [facility]    Order Specific Question:   Is Oral Contrast requested for this exam?    Answer:   No oral contrast    Order Specific Question:   Reason for No Oral Contrast    Answer:   Medical necessity (Time Sensitive)    Order Specific Question:   Reason for Exam (SYMPTOM  OR DIAGNOSIS REQUIRED)    Answer:   CLL and newly noted hypercalcemia.  Rule out new lung malignancy or other metastatic malignancy and bone lesions and evaluate for progression of CLL.  No IV dye due to AKI   NM Bone Scan Whole Body    Standing Status:   Future    Standing Expiration Date:   09/13/2021    Order Specific Question:   If indicated for the ordered procedure, I authorize the administration of a radiopharmaceutical per Radiology protocol    Answer:   Yes    Order Specific Question:   Preferred imaging location?    Answer:   [facility]   Calcium, ionized    Standing Status:   Future    Number of Occurrences:   1    Standing Expiration Date:   09/13/2021   PTH, intact and calcium    Standing Status:   Future    Number of Occurrences:   1    Standing Expiration Date:   09/13/2021   Multiple Myeloma Panel (SPEP&IFE w/QIG)    Standing Status:   Future    Number of Occurrences:   1    Standing Expiration Date:   09/13/2021   Kappa/lambda light chains    Standing Status:   Future     Number of Occurrences:   1    Standing Expiration Date:   09/13/2021   Vitamin D 25 hydroxy    Standing Status:   Future    Number of Occurrences:   1    Standing Expiration Date:   09/13/2021   Calcitriol (1,25 di-OH Vit D)    Standing Status:   Future    Number of Occurrences:   1    Standing Expiration Date:   09/13/2021   PTH related peptide    Standing Status:   Future    Number of Occurrences:   1    Standing Expiration Date:   09/13/2021     2) Incidentally noted left parapelvic right kidney cyst measuring 5.1 x 3.5 x 6.3 cm in size.  Plan -Previously recommend CT scan of the abdomen and urology evaluation  by primary care physician. -Pt notes he will be establishing care with Dr. Tresa Moore in urology   3) . Patient Active Problem List   Diagnosis Date Noted   Hypercalcemia 09/13/2020   CLL (chronic lymphocytic leukemia) ([place]) 01/21/2016  Elevated WBC count 01/16/2016   Glaucoma 01/16/2016   Thrombocytopenia ([place]) 01/16/2016   Diverticulosis 01/16/2016   Encounter for colonoscopy due to history of adenomatous colonic polyps 01/16/2016   Allergic rhinitis 01/16/2016   Plan -f/u with PCP for mx   FOLLOW UP:  Additional labs today for hypercalcemia work-up IV fluids and Xgeva urgently as soon as possible tomorrow 9/9 CT chest abdomen pelvis without contrast ASAP Bone scan in 5-7 days Labs and MD visit and IVF (1L NS) on monday 9/12  . The total time spent in the appointment was 45 minutes and more than 50% was on counseling and direct patient cares.  All of the patient's questions were answered with apparent satisfaction. The patient knows to call the clinic with any problems, questions or concerns.    Sullivan Lone MD [place] AAHIVMS [facility] Hematology/Oncology Physician [facility]  (Office):       [phone] (Work cell):  [phone] (Fax):           [phone]

## 2020-09-14 ENCOUNTER — Inpatient Hospital Stay: Payer: Medicare Other

## 2020-09-14 DIAGNOSIS — R634 Abnormal weight loss: Secondary | ICD-10-CM | POA: Diagnosis not present

## 2020-09-14 DIAGNOSIS — C9110 Chronic lymphocytic leukemia of B-cell type not having achieved remission: Secondary | ICD-10-CM | POA: Diagnosis not present

## 2020-09-14 DIAGNOSIS — R630 Anorexia: Secondary | ICD-10-CM | POA: Diagnosis not present

## 2020-09-14 DIAGNOSIS — D539 Nutritional anemia, unspecified: Secondary | ICD-10-CM | POA: Diagnosis not present

## 2020-09-14 LAB — KAPPA/LAMBDA LIGHT CHAINS
Kappa free light chain: 184.6 mg/L — ABNORMAL HIGH (ref 3.3–19.4)
Kappa, lambda light chain ratio: 7.6 — ABNORMAL HIGH (ref 0.26–1.65)
Lambda free light chains: 24.3 mg/L (ref 5.7–26.3)

## 2020-09-14 LAB — PTH, INTACT AND CALCIUM
Calcium, Total (PTH): 11.6 mg/dL — ABNORMAL HIGH (ref 8.6–10.2)
PTH: 7 pg/mL — ABNORMAL LOW (ref 15–65)

## 2020-09-14 LAB — HAPTOGLOBIN: Haptoglobin: 164 mg/dL (ref 38–329)

## 2020-09-14 LAB — CALCITRIOL (1,25 DI-OH VIT D): Vit D, 1,25-Dihydroxy: 108 pg/mL — ABNORMAL HIGH (ref 24.8–81.5)

## 2020-09-14 MED ORDER — DENOSUMAB 120 MG/1.7ML ~~LOC~~ SOLN
120.0000 mg | Freq: Once | SUBCUTANEOUS | Status: AC
  Administered 2020-09-14: 120 mg via SUBCUTANEOUS
  Filled 2020-09-14: qty 1.7

## 2020-09-14 MED ORDER — SODIUM CHLORIDE 0.9 % IV SOLN: INTRAVENOUS | Status: AC

## 2020-09-14 NOTE — Patient Instructions (Signed)

## 2020-09-14 NOTE — Progress Notes (Signed)
Pt is in infusion today getting fluids. Informed pt: per Dr Irene Limbo  his B12 levels are still somewhat low at 281.  He should start taking B12 1000 mcg p.o. over-the-counter in addition to his vitamin B complex.  Drink at least 1/2 to 2 L of water daily. Pt acknowledged and verbalized understanding.

## 2020-09-15 LAB — CALCIUM, IONIZED: Calcium, Ionized, Serum: 6.4 mg/dL — ABNORMAL HIGH (ref 4.5–5.6)

## 2020-09-17 ENCOUNTER — Other Ambulatory Visit: Payer: Medicare Other

## 2020-09-17 ENCOUNTER — Other Ambulatory Visit: Payer: Self-pay

## 2020-09-17 ENCOUNTER — Inpatient Hospital Stay: Payer: Medicare Other

## 2020-09-17 DIAGNOSIS — C9110 Chronic lymphocytic leukemia of B-cell type not having achieved remission: Secondary | ICD-10-CM

## 2020-09-17 DIAGNOSIS — R634 Abnormal weight loss: Secondary | ICD-10-CM | POA: Diagnosis not present

## 2020-09-17 DIAGNOSIS — R630 Anorexia: Secondary | ICD-10-CM | POA: Diagnosis not present

## 2020-09-17 DIAGNOSIS — D539 Nutritional anemia, unspecified: Secondary | ICD-10-CM | POA: Diagnosis not present

## 2020-09-17 LAB — CBC WITH DIFFERENTIAL (CANCER CENTER ONLY)
Abs Immature Granulocytes: 0.02 10*3/uL (ref 0.00–0.07)
Basophils Absolute: 0 10*3/uL (ref 0.0–0.1)
Basophils Relative: 0 %
Eosinophils Absolute: 0 10*3/uL (ref 0.0–0.5)
Eosinophils Relative: 0 %
HCT: 28.7 % — ABNORMAL LOW (ref 39.0–52.0)
Hemoglobin: 9.2 g/dL — ABNORMAL LOW (ref 13.0–17.0)
Immature Granulocytes: 0 %
Lymphocytes Relative: 76 %
Lymphs Abs: 15.2 10*3/uL — ABNORMAL HIGH (ref 0.7–4.0)
MCH: 31.9 pg (ref 26.0–34.0)
MCHC: 32.1 g/dL (ref 30.0–36.0)
MCV: 99.7 fL (ref 80.0–100.0)
Monocytes Absolute: 1.3 10*3/uL — ABNORMAL HIGH (ref 0.1–1.0)
Monocytes Relative: 6 %
Neutro Abs: 3.7 10*3/uL (ref 1.7–7.7)
Neutrophils Relative %: 18 %
Platelet Count: 154 10*3/uL (ref 150–400)
RBC: 2.88 MIL/uL — ABNORMAL LOW (ref 4.22–5.81)
RDW: 15.3 % (ref 11.5–15.5)
WBC Count: 20.2 10*3/uL — ABNORMAL HIGH (ref 4.0–10.5)
nRBC: 0 % (ref 0.0–0.2)

## 2020-09-17 LAB — CMP (CANCER CENTER ONLY)
ALT: 21 U/L (ref 0–44)
AST: 22 U/L (ref 15–41)
Albumin: 3.4 g/dL — ABNORMAL LOW (ref 3.5–5.0)
Alkaline Phosphatase: 66 U/L (ref 38–126)
Anion gap: 9 (ref 5–15)
BUN: 42 mg/dL — ABNORMAL HIGH (ref 8–23)
CO2: 28 mmol/L (ref 22–32)
Calcium: 10.5 mg/dL — ABNORMAL HIGH (ref 8.9–10.3)
Chloride: 106 mmol/L (ref 98–111)
Creatinine: 1.45 mg/dL — ABNORMAL HIGH (ref 0.61–1.24)
GFR, Estimated: 46 mL/min — ABNORMAL LOW (ref >60)
Glucose, Bld: 117 mg/dL — ABNORMAL HIGH (ref 70–99)
Potassium: 3.8 mmol/L (ref 3.5–5.1)
Sodium: 143 mmol/L (ref 135–145)
Total Bilirubin: 0.4 mg/dL (ref 0.3–1.2)
Total Protein: 6.4 g/dL — ABNORMAL LOW (ref 6.5–8.1)

## 2020-09-17 LAB — MULTIPLE MYELOMA PANEL, SERUM
Albumin SerPl Elph-Mcnc: 3.2 g/dL (ref 2.9–4.4)
Albumin/Glob SerPl: 1.2 (ref 0.7–1.7)
Alpha 1: 0.3 g/dL (ref 0.0–0.4)
Alpha2 Glob SerPl Elph-Mcnc: 0.7 g/dL (ref 0.4–1.0)
B-Globulin SerPl Elph-Mcnc: 0.8 g/dL (ref 0.7–1.3)
Gamma Glob SerPl Elph-Mcnc: 1 g/dL (ref 0.4–1.8)
Globulin, Total: 2.7 g/dL (ref 2.2–3.9)
IgA: 112 mg/dL (ref 61–437)
IgG (Immunoglobin G), Serum: 1123 mg/dL (ref 603–1613)
IgM (Immunoglobulin M), Srm: 32 mg/dL (ref 15–143)
M Protein SerPl Elph-Mcnc: 0.6 g/dL — ABNORMAL HIGH
Total Protein ELP: 5.9 g/dL — ABNORMAL LOW (ref 6.0–8.5)

## 2020-09-17 LAB — LACTATE DEHYDROGENASE: LDH: 240 U/L — ABNORMAL HIGH (ref 98–192)

## 2020-09-17 MED ORDER — SODIUM CHLORIDE 0.9 % IV SOLN: Freq: Once | INTRAVENOUS | Status: AC

## 2020-09-17 NOTE — Patient Instructions (Signed)

## 2020-09-18 ENCOUNTER — Ambulatory Visit: Payer: Medicare Other | Admitting: Hematology

## 2020-09-18 ENCOUNTER — Other Ambulatory Visit: Payer: Medicare Other

## 2020-09-18 ENCOUNTER — Ambulatory Visit (HOSPITAL_COMMUNITY)
Admission: RE | Admit: 2020-09-18 | Discharge: 2020-09-18 | Disposition: A | Discharge status: Home / Self Care | Payer: Medicare Other | Source: Ambulatory Visit | Attending: Hematology | Admitting: Hematology

## 2020-09-18 DIAGNOSIS — K449 Diaphragmatic hernia without obstruction or gangrene: Secondary | ICD-10-CM | POA: Diagnosis not present

## 2020-09-18 DIAGNOSIS — C9110 Chronic lymphocytic leukemia of B-cell type not having achieved remission: Secondary | ICD-10-CM

## 2020-09-18 DIAGNOSIS — N281 Cyst of kidney, acquired: Secondary | ICD-10-CM | POA: Diagnosis not present

## 2020-09-18 DIAGNOSIS — K5730 Diverticulosis of large intestine without perforation or abscess without bleeding: Secondary | ICD-10-CM | POA: Diagnosis not present

## 2020-09-18 DIAGNOSIS — N200 Calculus of kidney: Secondary | ICD-10-CM | POA: Diagnosis not present

## 2020-09-18 LAB — CALCIUM, IONIZED: Calcium, Ionized, Serum: 5.6 mg/dL (ref 4.5–5.6)

## 2020-09-20 ENCOUNTER — Other Ambulatory Visit: Payer: Self-pay

## 2020-09-20 NOTE — Progress Notes (Signed)
Error

## 2020-09-21 ENCOUNTER — Inpatient Hospital Stay (HOSPITAL_BASED_OUTPATIENT_CLINIC_OR_DEPARTMENT_OTHER): Payer: Medicare Other | Admitting: Hematology

## 2020-09-21 DIAGNOSIS — R918 Other nonspecific abnormal finding of lung field: Secondary | ICD-10-CM

## 2020-09-21 DIAGNOSIS — C9110 Chronic lymphocytic leukemia of B-cell type not having achieved remission: Secondary | ICD-10-CM | POA: Diagnosis not present

## 2020-09-21 MED ORDER — PREDNISONE 20 MG PO TABS: 20.0000 mg | ORAL_TABLET | Freq: Every day | ORAL | 0 refills | Status: DC

## 2020-09-25 LAB — PTH-RELATED PEPTIDE: PTH-related peptide: <2 pmol/L

## 2020-09-26 ENCOUNTER — Encounter (HOSPITAL_COMMUNITY)
Admission: RE | Admit: 2020-09-26 | Discharge: 2020-09-26 | Disposition: A | Discharge status: Home / Self Care | Payer: Medicare Other | Source: Ambulatory Visit | Attending: Hematology | Admitting: Hematology

## 2020-09-26 ENCOUNTER — Other Ambulatory Visit: Payer: Self-pay

## 2020-09-26 DIAGNOSIS — Z0389 Encounter for observation for other suspected diseases and conditions ruled out: Secondary | ICD-10-CM | POA: Diagnosis not present

## 2020-09-26 DIAGNOSIS — C9110 Chronic lymphocytic leukemia of B-cell type not having achieved remission: Secondary | ICD-10-CM

## 2020-09-26 MED ORDER — TECHNETIUM TC 99M MEDRONATE IV KIT
22.0000 | PACK | Freq: Once | INTRAVENOUS | Status: AC
  Administered 2020-09-26: 22 via INTRAVENOUS

## 2020-09-28 ENCOUNTER — Encounter: Payer: Self-pay | Admitting: Hematology

## 2020-09-28 NOTE — Progress Notes (Signed)
[name]  HEMATOLOGY ONCOLOGY PROGRESS NOTE  Date of service:  09/28/20   Patient Care Team: [name], MD as PCP - General (Internal Medicine)  Chief Complaint:  F/u for CLL, hypercalcemia  Diagnosis: CLL  Current Treatment: Observation  INTERVAL HISTORY:  .I connected with Brandon Norris on 09/28/20 at 12:00 PM EDT by telephone visit and verified that I am speaking with the correct person using two identifiers.   I discussed the limitations, risks, security and privacy concerns of performing an evaluation and management service by telemedicine and the availability of in-person appointments. I also discussed with the patient that there may be a patient responsible charge related to this service. The patient expressed understanding and agreed to proceed.    Brandon Norris was called for a scheduled phone visit to discuss his labs and to follow-up for IV fluids. Patient reports she is feeling much better after starting the prednisone and after the IV fluids.  His p.o. intake is nearly back to baseline. Labs on 09/17/2020 show CBC with a hemoglobin down to 9.2, WBC count stable at 20.2k platelets are within normal limits at 154k CMP creatinine has improved from 1.84 down to 1.45.  Calcium levels are down from 12 to 10.5. Myeloma panel 09/13/2020 showed an M spike of 0.6 g/dL 25-hydroxy vitamin D levels within normal limits at 86.96 1, 25 hydroxy vitamin D level is elevated at 108 PTHRP less than 2 LDH 240.  Patient notes some improvement in his generalized weakness and improvement in his appetite. No new focal bone pains  Patient had a CT of the chest abdomen pelvis 09/18/2020 part of his hypercalcemia work-up and to evaluate progression of CLL.   He was noted to have 1. Emphysematous changes and pulmonary scarring. 2. Diffuse pattern of centrilobular nodularity throughout both lungs could reflect respiratory bronchiolitis. Other possibilities would include silicosis,  sarcoidosis or hypersensitivity pneumonitis. Pulmonary consultation is suggested. Comparison with any prior chest CTs may be helpful for further evaluation. 3. No adenopathy involving the chest, abdomen pelvis. 4. Moderate-sized hiatal hernia. 5. Borderline splenic enlargement. 6. Small bilateral renal calculi. 7. Severe sigmoid colon diverticulosis without findings for acute diverticulitis.  We discussed getting a pulmonology consult to evaluate for sarcoidosis especially in the context of elevated one 25-hydroxy vitamin D levels and lung findings.  REVIEW OF SYSTEMS:   .10 Point review of Systems was done is negative except as noted above.    Past Medical History:  Diagnosis Date   Actinic keratoses    Allergic rhinitis    Atypical nevi    Diverticulosis    Enlarged prostate    Glaucoma    Glaucoma    H/O degenerative disc disease    Hx of adenomatous colonic polyps    Malignant melanoma of skin of abdomen ([place]) 2016   Rotator cuff tendinitis    Thrombocytopenia ([place])     . Past Surgical History:  Procedure Laterality Date   HERNIA REPAIR     Left inguinal herniorrhaphy     Melanoma on abdomen  2016     Social History   Tobacco Use   Smoking status: Never   Smokeless tobacco: Never  Vaping Use   Vaping Use: Never used  Substance Use Topics   Alcohol use: Yes    Alcohol/week: 3.0 standard drinks    Types: 3 Glasses of wine per week   Drug use: No    ALLERGIES:  has No Known Allergies.  MEDICATIONS:  Current Outpatient Medications  Medication Sig Dispense Refill   predniSONE (DELTASONE) 20 MG tablet Take 1 tablet (20 mg total) by mouth daily with breakfast. 30 tablet 0   Cholecalciferol (VITAMIN D3) 50 MCG (2000 UT) TABS Take 1 tablet by mouth daily at 2 PM.     dorzolamide-timolol (COSOPT) 22.3-6.8 MG/ML ophthalmic solution 1 drop 2 (two) times daily.     ID NOW COVID-19 KIT TEST AS DIRECTED TODAY     latanoprost (XALATAN) 0.005 % ophthalmic solution  1 drop into BOTH  eye in the evening     Multiple Vitamin (MULTIVITAMINS PO)      timolol (BETIMOL) 0.25 % ophthalmic solution 1 drop into BOTH EYES (Patient not taking: No sig reported)     timolol (TIMOPTIC) 0.5 % ophthalmic solution Place 1 drop into both eyes 2 (two) times daily. (Patient not taking: No sig reported)  0   Zinc 50 MG TABS Take 1 tablet by mouth daily at 2 PM. (Patient not taking: No sig reported)     No current facility-administered medications for this visit.    PHYSICAL EXAMINATION: Telemedicine visit LABORATORY DATA:   I have reviewed the data as listed  . CBC Latest Ref Rng & Units 09/17/2020 09/13/2020 07/12/2020  WBC 4.0 - 10.5 K/uL 20.2(H) 20.2(H) 16.4(H)  Hemoglobin 13.0 - 17.0 g/dL 9.2(L) 9.7(L) 10.7(L)  Hematocrit 39.0 - 52.0 % 28.7(L) 30.3(L) 32.5(L)  Platelets 150 - 400 K/uL 154 158 105(L)    . CMP Latest Ref Rng & Units 09/17/2020 09/13/2020 09/13/2020  Glucose 70 - 99 mg/dL 117(H) 104(H) -  BUN 8 - 23 mg/dL 42(H) 38(H) -  Creatinine 0.61 - 1.24 mg/dL 1.45(H) 1.84(H) -  Sodium 135 - 145 mmol/L 143 145 -  Potassium 3.5 - 5.1 mmol/L 3.8 3.7 -  Chloride 98 - 111 mmol/L 106 105 -  CO2 22 - 32 mmol/L 28 30 -  Calcium 8.9 - 10.3 mg/dL 10.5(H) 11.6(H) 12.0(H)  Total Protein 6.5 - 8.1 g/dL 6.4(L) 6.5 -  Total Bilirubin 0.3 - 1.2 mg/dL 0.4 0.4 -  Alkaline Phos 38 - 126 U/L 66 78 -  AST 15 - 41 U/L 22 18 -  ALT 0 - 44 U/L 21 17 -   Component     Latest Ref Rng & Units 09/13/2020  Retic Ct Pct     0.4 - 3.1 % 1.6  RBC.     4.22 - 5.81 MIL/uL 3.08 (L)  Retic Count, Absolute     19.0 - 186.0 K/uL 49.9  Immature Retic Fract     2.3 - 15.9 % 6.8  Reticulocyte Hemoglobin     >27.9 pg 34.8  Vitamin B12     180 - 914 pg/mL 281  LDH     98 - 192 U/L 226 (H)  Ferritin     24 - 336 ng/mL 322   [place] ABD (01/28/2016)  ABDOMEN ULTRASOUND COMPLETE   COMPARISON:  None.   FINDINGS: Gallbladder: No gallstones or wall thickening visualized. There is no  pericholecystic fluid. No sonographic Murphy sign noted by sonographer.   Common bile duct: Diameter: 2 mm. There is no intrahepatic, common hepatic, or common bile duct dilatation.   Liver: No focal lesion identified. Within normal limits in parenchymal echogenicity.   IVC: No abnormality visualized.   Pancreas: Visualized portion unremarkable. Portions of pancreas obscured by gas.   Spleen: Spleen measures 16.0 x 14.2 x 5.8 cm with a measures splenic volume of 621 cubic cm. No focal splenic lesions  are evident.   Right Kidney: Length: 11.4 cm. Echogenicity within normal limits. No hydronephrosis visualized. There is a parapelvic cyst in the left kidney measuring 5.1 x 3.5 x 6.3 cm.   Left Kidney: Length: 11.1 cm. Echogenicity within normal limits. No mass or hydronephrosis visualized.   Abdominal aorta: No aneurysm visualized.   Other findings: No demonstrable ascites.   IMPRESSION: Splenomegaly.  No focal splenic lesions evident.   Parapelvic cyst right kidney measuring 5.1 x 3.5 x 6.3 cm.   Portions of pancreas obscured by gas. Visualized portions of pancreas appear normal.   Study otherwise unremarkable.     Electronically Signed   By: Lowella Grip III M.D.   On: 01/28/2016 14:09  FISH, CLL Prognostic on 08/26/16   RADIOGRAPHIC STUDIES: I have personally reviewed the radiological images as listed and agreed with the findings in the report. CT CHEST ABDOMEN PELVIS WO CONTRAST  Result Date: 09/18/2020 CLINICAL DATA:  History of CLL. EXAM: CT CHEST, ABDOMEN AND PELVIS WITHOUT CONTRAST TECHNIQUE: Multidetector CT imaging of the chest, abdomen and pelvis was performed following the standard protocol without IV contrast. COMPARISON:  None. FINDINGS: CT CHEST FINDINGS Cardiovascular: The heart is normal in size. No pericardial effusion mild tortuosity and calcification of the thoracic aorta but focal aneurysm. Mediastinum/Nodes: No mediastinal or hilar mass or  adenopathy. There is a moderate-sized hiatal hernia containing contrast. Lungs/Pleura: Emphysematous changes and pulmonary scarring. Diffuse pattern of centrilobular nodularity throughout both lungs. This could reflect respiratory bronchiolitis. Other possibilities would include silicosis, sarcoidosis or hypersensitivity pneumonitis. Pulmonary consultation is suggested. Comparison with any prior chest CTs may be helpful for further evaluation. Musculoskeletal: No significant bony findings. No supraclavicular or axillary adenopathy. CT ABDOMEN PELVIS FINDINGS Hepatobiliary: No hepatic lesions are identified without contrast. Gallbladder is grossly normal. No common bile duct dilatation. Pancreas: No mass, inflammation or ductal dilatation. Spleen: Borderline splenic enlargement. Spleen measures 14.5 x 10.8 x 7 cm. Adrenals/Urinary Tract: Adrenal glands are unremarkable. Simple right renal cyst is noted. Small bilateral renal calculi. No worrisome renal lesions without contrast. The bladder is grossly normal. Stomach/Bowel: The stomach, duodenum, small bowel and colon are grossly normal. No acute inflammatory process, mass lesion obstructive findings. Severe sigmoid colon diverticulosis is noted but no findings for acute diverticulitis. Vascular/Lymphatic: Vascular calcifications but no aneurysm. No mesenteric or retroperitoneal mass or adenopathy. Reproductive: The prostate gland seminal vesicles are unremarkable. Other: No pelvic mass or adenopathy. No free pelvic fluid collections. No inguinal mass or adenopathy. No abdominal wall hernia or subcutaneous lesions. Musculoskeletal: No significant bony findings. Scoliosis and degenerative changes involving the spine. No bone lesions or fractures. IMPRESSION: 1. Emphysematous changes and pulmonary scarring. 2. Diffuse pattern of centrilobular nodularity throughout both lungs could reflect respiratory bronchiolitis. Other possibilities would include silicosis,  sarcoidosis or hypersensitivity pneumonitis. Pulmonary consultation is suggested. Comparison with any prior chest CTs may be helpful for further evaluation. 3. No adenopathy involving the chest, abdomen pelvis. 4. Moderate-sized hiatal hernia. 5. Borderline splenic enlargement. 6. Small bilateral renal calculi. 7. Severe sigmoid colon diverticulosis without findings for acute diverticulitis. Aortic Atherosclerosis (ICD10-I70.0) and Emphysema (ICD10-J43.9). Electronically Signed   By: Marijo Sanes M.D.   On: 09/18/2020 17:03    ASSESSMENT & PLAN:   87 y.o. male with  1) Rai Stage 2 Chronic lymphocytic leukemia. (with splenomegaly) Trisomy 12 and a 13q deletion.  Would be stage II if splenomegaly was considered to be related to the CLL. Patient however notes that he has had splenomegaly for  decades without clear etiology noted previously.  WBC counts are not changed significantly. No anemia Mild thrombocytopenia that is not significantly changed platelets around 101k.  No constitutional symptoms. No issues with infections/bleeding. No clinically palpable lymphadenopathy ( US showed enlarged spleen 16x14.2x5.8 cms)  2) new worsening grade 2-3 fatigue  3) anorexia with weight loss of 11 pounds over the last 2 months.  4) new hypercalcemia with calcium of 12 with acute kidney injury with a creatinine of 1.84.  5) macrocytic anemia B12 borderline low at 281 up from 233. Has been taking B complex 1 capsule daily Would recommend taking additional B12 1000 mcg daily over-the-counter.  PLAN: -Discussed pt labwork tabs on 09/17/2020 show CBC with a hemoglobin down to 9.2, WBC count stable at 20.2k platelets are within normal limits at 154k CMP creatinine has improved from 1.84 down to 1.45.  Calcium levels are down from 12 to 10.5. Myeloma panel 09/13/2020 showed an M spike of 0.6 g/dL 25-hydroxy vitamin D levels within normal limits at 86.96 1, 25 hydroxy vitamin D level is elevated at 108 PTHRP  less than 2 LDH 240.  Patient notes some improvement in his generalized weakness and improvement in his appetite. No new focal bone pains  Patient had a CT of the chest abdomen pelvis 09/18/2020 part of his hypercalcemia work-up and to evaluate progression of CLL.   He was noted to have 1. Emphysematous changes and pulmonary scarring. 2. Diffuse pattern of centrilobular nodularity throughout both lungs could reflect respiratory bronchiolitis. Other possibilities would include silicosis, sarcoidosis or hypersensitivity pneumonitis. Pulmonary consultation is suggested. Comparison with any prior chest CTs may be helpful for further evaluation. 3. No adenopathy involving the chest, abdomen pelvis. 4. Moderate-sized hiatal hernia. 5. Borderline splenic enlargement. 6. Small bilateral renal calculi. 7. Severe sigmoid colon diverticulosis without findings for acute diverticulitis.  -We discussed getting a pulmonology consult to evaluate for sarcoidosis especially in the context of elevated one 25-hydroxy vitamin D levels and lung findings. -Continue prednisone 20 mg p.o. daily -Pending ordered bone scan  . No orders of the defined types were placed in this encounter.    6) Incidentally noted left parapelvic right kidney cyst measuring 5.1 x 3.5 x 6.3 cm in size.  Plan Ct abd- 9/13-- simple rt renal cyst noted. No concerning renal lesions.   7) . Patient Active Problem List   Diagnosis Date Noted   Hypercalcemia 09/13/2020   CLL (chronic lymphocytic leukemia) ([place]) 01/21/2016   Elevated WBC count 01/16/2016   Glaucoma 01/16/2016   Thrombocytopenia ([place]) 01/16/2016   Diverticulosis 01/16/2016   Encounter for colonoscopy due to history of adenomatous colonic polyps 01/16/2016   Allergic rhinitis 01/16/2016   Plan -f/u with PCP for mx   FOLLOW UP:  note: -Referral to Dr. Theda Sers pulmonary to be seen in 1 week for possible pulmonary sarcoidosis with hypercalcemia  versus CLL lung involvement versus other etiology of nodular infiltrates. -CT bone marrow aspiration and biopsy in 3 to 5 days -Bone scan as scheduled on 9/21 -Return to clinic with Dr. Irene Limbo in 2 weeks with labs    . The total time spent in the appointment was 40 minutes and more than 50% was on counseling and direct patient cares.   All of the patient's questions were answered with apparent satisfaction. The patient knows to call the clinic with any problems, questions or concerns.    Sullivan Lone MD [place] AAHIVMS [facility] Hematology/Oncology Physician [place]  (Office):  [phone] (Work cell):  [phone] (Fax):           [phone]

## 2020-10-05 ENCOUNTER — Other Ambulatory Visit: Payer: Self-pay | Admitting: *Deleted

## 2020-10-05 DIAGNOSIS — C9110 Chronic lymphocytic leukemia of B-cell type not having achieved remission: Secondary | ICD-10-CM

## 2020-10-08 ENCOUNTER — Other Ambulatory Visit: Payer: Self-pay

## 2020-10-08 ENCOUNTER — Inpatient Hospital Stay: Payer: Medicare Other | Attending: Hematology

## 2020-10-08 ENCOUNTER — Inpatient Hospital Stay (HOSPITAL_BASED_OUTPATIENT_CLINIC_OR_DEPARTMENT_OTHER): Payer: Medicare Other | Admitting: Hematology

## 2020-10-08 VITALS — BP 128/57 | HR 61 | Temp 98.5°F | Resp 17 | Ht 66.5 in | Wt 124.8 lb

## 2020-10-08 DIAGNOSIS — R630 Anorexia: Secondary | ICD-10-CM | POA: Diagnosis not present

## 2020-10-08 DIAGNOSIS — C9110 Chronic lymphocytic leukemia of B-cell type not having achieved remission: Secondary | ICD-10-CM | POA: Insufficient documentation

## 2020-10-08 DIAGNOSIS — D696 Thrombocytopenia, unspecified: Secondary | ICD-10-CM | POA: Insufficient documentation

## 2020-10-08 DIAGNOSIS — R5383 Other fatigue: Secondary | ICD-10-CM | POA: Insufficient documentation

## 2020-10-08 DIAGNOSIS — D539 Nutritional anemia, unspecified: Secondary | ICD-10-CM | POA: Insufficient documentation

## 2020-10-08 DIAGNOSIS — R918 Other nonspecific abnormal finding of lung field: Secondary | ICD-10-CM | POA: Diagnosis not present

## 2020-10-08 LAB — CBC WITH DIFFERENTIAL (CANCER CENTER ONLY)
Abs Immature Granulocytes: 0.07 10*3/uL (ref 0.00–0.07)
Basophils Absolute: 0.1 10*3/uL (ref 0.0–0.1)
Basophils Relative: 0 %
Eosinophils Absolute: 0.1 10*3/uL (ref 0.0–0.5)
Eosinophils Relative: 0 %
HCT: 30.9 % — ABNORMAL LOW (ref 39.0–52.0)
Hemoglobin: 10 g/dL — ABNORMAL LOW (ref 13.0–17.0)
Immature Granulocytes: 0 %
Lymphocytes Relative: 79 %
Lymphs Abs: 25.7 10*3/uL — ABNORMAL HIGH (ref 0.7–4.0)
MCH: 32.5 pg (ref 26.0–34.0)
MCHC: 32.4 g/dL (ref 30.0–36.0)
MCV: 100.3 fL — ABNORMAL HIGH (ref 80.0–100.0)
Monocytes Absolute: 1.8 10*3/uL — ABNORMAL HIGH (ref 0.1–1.0)
Monocytes Relative: 5 %
Neutro Abs: 5.4 10*3/uL (ref 1.7–7.7)
Neutrophils Relative %: 16 %
Platelet Count: 120 10*3/uL — ABNORMAL LOW (ref 150–400)
RBC: 3.08 MIL/uL — ABNORMAL LOW (ref 4.22–5.81)
RDW: 16.2 % — ABNORMAL HIGH (ref 11.5–15.5)
WBC Count: 33.1 10*3/uL — ABNORMAL HIGH (ref 4.0–10.5)
nRBC: 0 % (ref 0.0–0.2)

## 2020-10-08 LAB — CMP (CANCER CENTER ONLY)
ALT: 16 U/L (ref 0–44)
AST: 14 U/L — ABNORMAL LOW (ref 15–41)
Albumin: 3.5 g/dL (ref 3.5–5.0)
Alkaline Phosphatase: 50 U/L (ref 38–126)
Anion gap: 7 (ref 5–15)
BUN: 23 mg/dL (ref 8–23)
CO2: 27 mmol/L (ref 22–32)
Calcium: 9 mg/dL (ref 8.9–10.3)
Chloride: 106 mmol/L (ref 98–111)
Creatinine: 1.11 mg/dL (ref 0.61–1.24)
GFR, Estimated: >60 mL/min (ref >60)
Glucose, Bld: 102 mg/dL — ABNORMAL HIGH (ref 70–99)
Potassium: 4.3 mmol/L (ref 3.5–5.1)
Sodium: 140 mmol/L (ref 135–145)
Total Bilirubin: 0.5 mg/dL (ref 0.3–1.2)
Total Protein: 6.1 g/dL — ABNORMAL LOW (ref 6.5–8.1)

## 2020-10-08 LAB — SAMPLE TO BLOOD BANK

## 2020-10-08 LAB — LACTATE DEHYDROGENASE: LDH: 202 U/L — ABNORMAL HIGH (ref 98–192)

## 2020-10-09 ENCOUNTER — Telehealth: Payer: Self-pay | Admitting: Hematology

## 2020-10-09 NOTE — Telephone Encounter (Signed)
Scheduled follow-up appointment per 10/3 los. Patient is aware.

## 2020-10-15 ENCOUNTER — Encounter: Payer: Self-pay | Admitting: Hematology

## 2020-10-15 MED ORDER — PREDNISONE 10 MG PO TABS: 10.0000 mg | ORAL_TABLET | Freq: Every day | ORAL | 0 refills | Status: DC

## 2020-10-15 NOTE — Addendum Note (Signed)
Addended by: Sullivan Lone on: 10/15/2020 02:38 AM   Modules accepted: Orders

## 2020-10-15 NOTE — Progress Notes (Addendum)
Brandon Norris  HEMATOLOGY ONCOLOGY PROGRESS NOTE  Date of service:  10/15/20   Patient Care Team: [name], MD as PCP - General (Internal Medicine)  Chief Complaint:  F/u for CLL, hypercalcemia  Diagnosis: CLL  Current Treatment: Observation  INTERVAL HISTORY:   Brandon Norris is here for follow-up of his CLL and hypercalcemia. He notes he is feeling 85% better.  His fatigue has resolved.  He is eating much better.  His cough is resolved.  No acute shortness of breath chest pain or dyspnea on exertion.  Bone scan on 09/26/2020 shows no evidence of bone metastases. Labs done today show hemoglobin is improved to 10 WBC count of 33k platelets of 120k CMP shows resolution of hypercalcemia with a calcium level of 9 otherwise unremarkable. He is still awaiting a pulmonary consultation visit with Dr. Chase Caller.  No other acute new symptoms.  REVIEW OF SYSTEMS:   .10 Point review of Systems was done is negative except as noted above.    Past Medical History:  Diagnosis Date   Actinic keratoses    Allergic rhinitis    Atypical nevi    Diverticulosis    Enlarged prostate    Glaucoma    Glaucoma    H/O degenerative disc disease    Hx of adenomatous colonic polyps    Malignant melanoma of skin of abdomen ([place]) 2016   Rotator cuff tendinitis    Thrombocytopenia ([place])     . Past Surgical History:  Procedure Laterality Date   HERNIA REPAIR     Left inguinal herniorrhaphy     Melanoma on abdomen  2016     Social History   Tobacco Use   Smoking status: Never   Smokeless tobacco: Never  Vaping Use   Vaping Use: Never used  Substance Use Topics   Alcohol use: Yes    Alcohol/week: 3.0 standard drinks    Types: 3 Glasses of wine per week   Drug use: No    ALLERGIES:  has No Known Allergies.  MEDICATIONS:  Current Outpatient Medications  Medication Sig Dispense Refill   Cholecalciferol (VITAMIN D3) 50 MCG (2000 UT) TABS Take 1 tablet by mouth daily at 2 PM.      dorzolamide-timolol (COSOPT) 22.3-6.8 MG/ML ophthalmic solution 1 drop 2 (two) times daily.     ID NOW COVID-19 KIT TEST AS DIRECTED TODAY     latanoprost (XALATAN) 0.005 % ophthalmic solution 1 drop into BOTH  eye in the evening     Multiple Vitamin (MULTIVITAMINS PO)      predniSONE (DELTASONE) 20 MG tablet Take 1 tablet (20 mg total) by mouth daily with breakfast. 30 tablet 0   timolol (BETIMOL) 0.25 % ophthalmic solution 1 drop into BOTH EYES (Patient not taking: No sig reported)     timolol (TIMOPTIC) 0.5 % ophthalmic solution Place 1 drop into both eyes 2 (two) times daily. (Patient not taking: No sig reported)  0   Zinc 50 MG TABS Take 1 tablet by mouth daily at 2 PM. (Patient not taking: No sig reported)     No current facility-administered medications for this visit.    PHYSICAL EXAMINATION: Telemedicine visit LABORATORY DATA:   I have reviewed the data as listed  . CBC Latest Ref Rng & Units 10/08/2020 09/17/2020 09/13/2020  WBC 4.0 - 10.5 K/uL 33.1(H) 20.2(H) 20.2(H)  Hemoglobin 13.0 - 17.0 g/dL 10.0(L) 9.2(L) 9.7(L)  Hematocrit 39.0 - 52.0 % 30.9(L) 28.7(L) 30.3(L)  Platelets 150 - 400 K/uL 120(L) 154 158    .  CMP Latest Ref Rng & Units 10/08/2020 09/17/2020 09/13/2020  Glucose 70 - 99 mg/dL 102(H) 117(H) 104(H)  BUN 8 - 23 mg/dL 23 42(H) 38(H)  Creatinine 0.61 - 1.24 mg/dL 1.11 1.45(H) 1.84(H)  Sodium 135 - 145 mmol/L 140 143 145  Potassium 3.5 - 5.1 mmol/L 4.3 3.8 3.7  Chloride 98 - 111 mmol/L 106 106 105  CO2 22 - 32 mmol/L 27 28 30   Calcium 8.9 - 10.3 mg/dL 9.0 10.5(H) 11.6(H)  Total Protein 6.5 - 8.1 g/dL 6.1(L) 6.4(L) 6.5  Total Bilirubin 0.3 - 1.2 mg/dL 0.5 0.4 0.4  Alkaline Phos 38 - 126 U/L 50 66 78  AST 15 - 41 U/L 14(L) 22 18  ALT 0 - 44 U/L 16 21 17    Component     Latest Ref Rng & Units 09/13/2020  Retic Ct Pct     0.4 - 3.1 % 1.6  RBC.     4.22 - 5.81 MIL/uL 3.08 (L)  Retic Count, Absolute     19.0 - 186.0 K/uL 49.9  Immature Retic Fract     2.3 -  15.9 % 6.8  Reticulocyte Hemoglobin     >27.9 pg 34.8  Vitamin B12     180 - 914 pg/mL 281  LDH     98 - 192 U/L 226 (H)  Ferritin     24 - 336 ng/mL 322   [place] ABD (01/28/2016)  ABDOMEN ULTRASOUND COMPLETE   COMPARISON:  None.   FINDINGS: Gallbladder: No gallstones or wall thickening visualized. There is no pericholecystic fluid. No sonographic Murphy sign noted by sonographer.   Common bile duct: Diameter: 2 mm. There is no intrahepatic, common hepatic, or common bile duct dilatation.   Liver: No focal lesion identified. Within normal limits in parenchymal echogenicity.   IVC: No abnormality visualized.   Pancreas: Visualized portion unremarkable. Portions of pancreas obscured by gas.   Spleen: Spleen measures 16.0 x 14.2 x 5.8 cm with a measures splenic volume of 621 cubic cm. No focal splenic lesions are evident.   Right Kidney: Length: 11.4 cm. Echogenicity within normal limits. No hydronephrosis visualized. There is a parapelvic cyst in the left kidney measuring 5.1 x 3.5 x 6.3 cm.   Left Kidney: Length: 11.1 cm. Echogenicity within normal limits. No mass or hydronephrosis visualized.   Abdominal aorta: No aneurysm visualized.   Other findings: No demonstrable ascites.   IMPRESSION: Splenomegaly.  No focal splenic lesions evident.   Parapelvic cyst right kidney measuring 5.1 x 3.5 x 6.3 cm.   Portions of pancreas obscured by gas. Visualized portions of pancreas appear normal.   Study otherwise unremarkable.     Electronically Signed   By: Lowella Grip III M.D.   On: 01/28/2016 14:09  FISH, CLL Prognostic on 08/26/16   RADIOGRAPHIC STUDIES: I have personally reviewed the radiological images as listed and agreed with the findings in the report. NM Bone Scan Whole Body  Result Date: 09/28/2020 CLINICAL DATA:  Evaluate for bone lesions EXAM: NUCLEAR MEDICINE WHOLE BODY BONE SCAN TECHNIQUE: Whole body anterior and posterior images were obtained  approximately 3 hours after intravenous injection of radiopharmaceutical. RADIOPHARMACEUTICALS:  22.0 mCi Technetium-41mMDP IV COMPARISON:  None. FINDINGS: No suspicious foci of scintigraphic uptake. Mild uptake the bilateral shoulders, likely degenerative. Physiologic uptake seen in the kidneys and bladder. IMPRESSION: No suspicious foci of scintigraphic uptake. Electronically Signed   By: LYetta GlassmanM.D.   On: 09/28/2020 08:20   CT CHEST ABDOMEN PELVIS WO  CONTRAST  Result Date: 09/18/2020 CLINICAL DATA:  History of CLL. EXAM: CT CHEST, ABDOMEN AND PELVIS WITHOUT CONTRAST TECHNIQUE: Multidetector CT imaging of the chest, abdomen and pelvis was performed following the standard protocol without IV contrast. COMPARISON:  None. FINDINGS: CT CHEST FINDINGS Cardiovascular: The heart is normal in size. No pericardial effusion mild tortuosity and calcification of the thoracic aorta but focal aneurysm. Mediastinum/Nodes: No mediastinal or hilar mass or adenopathy. There is a moderate-sized hiatal hernia containing contrast. Lungs/Pleura: Emphysematous changes and pulmonary scarring. Diffuse pattern of centrilobular nodularity throughout both lungs. This could reflect respiratory bronchiolitis. Other possibilities would include silicosis, sarcoidosis or hypersensitivity pneumonitis. Pulmonary consultation is suggested. Comparison with any prior chest CTs may be helpful for further evaluation. Musculoskeletal: No significant bony findings. No supraclavicular or axillary adenopathy. CT ABDOMEN PELVIS FINDINGS Hepatobiliary: No hepatic lesions are identified without contrast. Gallbladder is grossly normal. No common bile duct dilatation. Pancreas: No mass, inflammation or ductal dilatation. Spleen: Borderline splenic enlargement. Spleen measures 14.5 x 10.8 x 7 cm. Adrenals/Urinary Tract: Adrenal glands are unremarkable. Simple right renal cyst is noted. Small bilateral renal calculi. No worrisome renal lesions  without contrast. The bladder is grossly normal. Stomach/Bowel: The stomach, duodenum, small bowel and colon are grossly normal. No acute inflammatory process, mass lesion obstructive findings. Severe sigmoid colon diverticulosis is noted but no findings for acute diverticulitis. Vascular/Lymphatic: Vascular calcifications but no aneurysm. No mesenteric or retroperitoneal mass or adenopathy. Reproductive: The prostate gland seminal vesicles are unremarkable. Other: No pelvic mass or adenopathy. No free pelvic fluid collections. No inguinal mass or adenopathy. No abdominal wall hernia or subcutaneous lesions. Musculoskeletal: No significant bony findings. Scoliosis and degenerative changes involving the spine. No bone lesions or fractures. IMPRESSION: 1. Emphysematous changes and pulmonary scarring. 2. Diffuse pattern of centrilobular nodularity throughout both lungs could reflect respiratory bronchiolitis. Other possibilities would include silicosis, sarcoidosis or hypersensitivity pneumonitis. Pulmonary consultation is suggested. Comparison with any prior chest CTs may be helpful for further evaluation. 3. No adenopathy involving the chest, abdomen pelvis. 4. Moderate-sized hiatal hernia. 5. Borderline splenic enlargement. 6. Small bilateral renal calculi. 7. Severe sigmoid colon diverticulosis without findings for acute diverticulitis. Aortic Atherosclerosis (ICD10-I70.0) and Emphysema (ICD10-J43.9). Electronically Signed   By: Marijo Sanes M.D.   On: 09/18/2020 17:03    ASSESSMENT & PLAN:   87 y.o. male with  1) Rai Stage 2 Chronic lymphocytic leukemia. (with splenomegaly) Trisomy 12 and a 13q deletion.  Would be stage II if splenomegaly was considered to be related to the CLL. Patient however notes that he has had splenomegaly for decades without clear etiology noted previously.  WBC counts are not changed significantly. No anemia Mild thrombocytopenia that is not significantly changed platelets  around 101k.  No constitutional symptoms. No issues with infections/bleeding. No clinically palpable lymphadenopathy ( US showed enlarged spleen 16x14.2x5.8 cms)  2) new worsening grade 2-3 fatigue  3) anorexia with weight loss of 11 pounds over the last 2 months.  4) new hypercalcemia with calcium of 12 with acute kidney injury with a creatinine of 1.84.- now resolved  5) macrocytic anemia B12 borderline low at 281 up from 233. Has been taking B complex 1 capsule daily Would recommend taking additional B12 1000 mcg daily over-the-counter.  PLAN: -Discussed pt labwork tabs today 10/14/2020.  Hypercalcemia has resolved.  Hemoglobin improved. -CT bone marrow biopsy still pending -Bone scan with no overt bone lesions -Awaiting pulmonology consult to evaluate for sarcoidosis especially in the context of elevated one  25-hydroxy vitamin D levels and lung findings. -Continue prednisone 20 mg p.o. daily for another 1 to 2 weeks and then we will taper down to 10 mg  6) Incidentally noted left parapelvic right kidney cyst measuring 5.1 x 3.5 x 6.3 cm in size.  Plan Ct abd- 9/13-- simple rt renal cyst noted. No concerning renal lesions.   7) . Patient Active Problem List   Diagnosis Date Noted   Hypercalcemia 09/13/2020   CLL (chronic lymphocytic leukemia) ([facility]) 01/21/2016   Elevated WBC count 01/16/2016   Glaucoma 01/16/2016   Thrombocytopenia ([place]) 01/16/2016   Diverticulosis 01/16/2016   Encounter for colonoscopy due to history of adenomatous colonic polyps 01/16/2016   Allergic rhinitis 01/16/2016   Plan -f/u with PCP for mx   FOLLOW UP: CT bone marrow aspiration and biopsy in 2 weeks Pulmonary consultation still pending RTC with Dr Irene Limbo with labs in 1 month   . The total time spent in the appointment was 30 minutes and more than 50% was on counseling and direct patient cares.    All of the patient's questions were answered with apparent satisfaction. The patient knows  to call the clinic with any problems, questions or concerns.    Sullivan Lone MD [place] [facility] Hematology/Oncology Physician [facility]  (Office):       [phone] (Work cell):  [phone] (Fax):           [phone]

## 2020-10-15 NOTE — Addendum Note (Signed)
Addended by: Sullivan Lone on: 10/15/2020 02:37 AM   Modules accepted: Level of Service

## 2020-10-16 ENCOUNTER — Other Ambulatory Visit: Payer: Self-pay | Admitting: Hematology

## 2020-10-25 ENCOUNTER — Ambulatory Visit (HOSPITAL_COMMUNITY): Payer: Medicare Other

## 2020-10-26 ENCOUNTER — Other Ambulatory Visit (HOSPITAL_COMMUNITY): Payer: Self-pay | Admitting: Physician Assistant

## 2020-10-29 ENCOUNTER — Ambulatory Visit (HOSPITAL_COMMUNITY)
Admission: RE | Admit: 2020-10-29 | Discharge: 2020-10-29 | Disposition: A | Discharge status: Home / Self Care | Payer: Medicare Other | Source: Ambulatory Visit | Attending: Hematology | Admitting: Hematology

## 2020-10-29 ENCOUNTER — Other Ambulatory Visit: Payer: Self-pay

## 2020-10-29 ENCOUNTER — Encounter (HOSPITAL_COMMUNITY): Payer: Self-pay

## 2020-10-29 DIAGNOSIS — N400 Enlarged prostate without lower urinary tract symptoms: Secondary | ICD-10-CM | POA: Insufficient documentation

## 2020-10-29 DIAGNOSIS — Z85820 Personal history of malignant melanoma of skin: Secondary | ICD-10-CM | POA: Insufficient documentation

## 2020-10-29 DIAGNOSIS — D539 Nutritional anemia, unspecified: Secondary | ICD-10-CM | POA: Diagnosis not present

## 2020-10-29 DIAGNOSIS — D649 Anemia, unspecified: Secondary | ICD-10-CM | POA: Diagnosis not present

## 2020-10-29 DIAGNOSIS — C9110 Chronic lymphocytic leukemia of B-cell type not having achieved remission: Secondary | ICD-10-CM | POA: Insufficient documentation

## 2020-10-29 LAB — CBC WITH DIFFERENTIAL/PLATELET
Abs Immature Granulocytes: 0.05 10*3/uL (ref 0.00–0.07)
Basophils Absolute: 0.1 10*3/uL (ref 0.0–0.1)
Basophils Relative: 0 %
Eosinophils Absolute: 0.2 10*3/uL (ref 0.0–0.5)
Eosinophils Relative: 0 %
HCT: 35.2 % — ABNORMAL LOW (ref 39.0–52.0)
Hemoglobin: 11.2 g/dL — ABNORMAL LOW (ref 13.0–17.0)
Immature Granulocytes: 0 %
Lymphocytes Relative: 89 %
Lymphs Abs: 30.5 10*3/uL — ABNORMAL HIGH (ref 0.7–4.0)
MCH: 33.1 pg (ref 26.0–34.0)
MCHC: 31.8 g/dL (ref 30.0–36.0)
MCV: 104.1 fL — ABNORMAL HIGH (ref 80.0–100.0)
Monocytes Absolute: 1 10*3/uL (ref 0.1–1.0)
Monocytes Relative: 3 %
Neutro Abs: 2.6 10*3/uL (ref 1.7–7.7)
Neutrophils Relative %: 8 %
Platelets: 121 10*3/uL — ABNORMAL LOW (ref 150–400)
RBC: 3.38 MIL/uL — ABNORMAL LOW (ref 4.22–5.81)
RDW: 16 % — ABNORMAL HIGH (ref 11.5–15.5)
WBC: 34.4 10*3/uL — ABNORMAL HIGH (ref 4.0–10.5)
nRBC: 0 % (ref 0.0–0.2)

## 2020-10-29 MED ORDER — FENTANYL CITRATE (PF) 100 MCG/2ML IJ SOLN
INTRAMUSCULAR | Status: AC | PRN
  Administered 2020-10-29 (×2): 25 ug via INTRAVENOUS

## 2020-10-29 MED ORDER — MIDAZOLAM HCL 2 MG/2ML IJ SOLN
INTRAMUSCULAR | Status: AC | PRN
Start: 2020-10-29 — End: 2020-10-29
  Administered 2020-10-29 (×2): .5 mg via INTRAVENOUS

## 2020-10-29 MED ORDER — MIDAZOLAM HCL 2 MG/2ML IJ SOLN
INTRAMUSCULAR | Status: AC
  Filled 2020-10-29: qty 4

## 2020-10-29 MED ORDER — FENTANYL CITRATE (PF) 100 MCG/2ML IJ SOLN
INTRAMUSCULAR | Status: AC
  Filled 2020-10-29: qty 2

## 2020-10-29 MED ORDER — SODIUM CHLORIDE 0.9 % IV SOLN: INTRAVENOUS | Status: DC

## 2020-10-29 NOTE — Consult Note (Signed)
Chief Complaint: Patient was seen in consultation today for  CT guided bone marrow biopsy  Referring Physician(s): Brunetta Genera  Supervising Physician: Daryll Brod  Patient Status: [facility] - Out-pt  History of Present Illness: Pleas Brandon Norris is an 87 y.o. male with past medical history of diverticulosis, enlarged prostate, glaucoma, degenerative disc disease, melanoma, thrombocytopenia and CLL/splenomegaly with worsening anemia.  There is also concern for possible sarcoidosis.  He presents today for CT-guided bone marrow biopsy for further evaluation.  Past Medical History:  Diagnosis Date   Actinic keratoses    Allergic rhinitis    Atypical nevi    Diverticulosis    Enlarged prostate    Glaucoma    Glaucoma    H/O degenerative disc disease    Hx of adenomatous colonic polyps    Malignant melanoma of skin of abdomen ([place]) 2016   Rotator cuff tendinitis    Thrombocytopenia ([place])     Past Surgical History:  Procedure Laterality Date   HERNIA REPAIR     Left inguinal herniorrhaphy     Melanoma on abdomen  2016    Allergies: Patient has no known allergies.  Medications: Prior to Admission medications   Medication Sig Start Date End Date Taking? Authorizing Provider  Cholecalciferol (VITAMIN D3) 50 MCG (2000 UT) TABS Take 1 tablet by mouth daily at 2 PM.   Yes [provider]  dorzolamide-timolol (COSOPT) 22.3-6.8 MG/ML ophthalmic solution 1 drop 2 (two) times daily. 07/02/20  Yes [provider]  latanoprost (XALATAN) 0.005 % ophthalmic solution 1 drop into BOTH  eye in the evening   Yes [provider]  Multiple Vitamin (MULTIVITAMINS PO)    Yes [provider]  predniSONE (DELTASONE) 20 MG tablet TAKE 1 TABLET(20 MG) BY MOUTH DAILY WITH BREAKFAST 10/16/20  Yes Brunetta Genera, MD  ID NOW COVID-19 KIT TEST AS DIRECTED TODAY 02/16/20   [provider]  predniSONE (DELTASONE) 10 MG tablet Take 1 tablet (10  mg total) by mouth daily with breakfast. After completion of 57m p daily 10/15/20   KBrunetta Genera MD  timolol (BETIMOL) 0.25 % ophthalmic solution 1 drop into BOTH EYES Patient not taking: No sig reported    [provider]  timolol (TIMOPTIC) 0.5 % ophthalmic solution Place 1 drop into both eyes 2 (two) times daily. Patient not taking: No sig reported 12/25/15   [provider]  Zinc 50 MG TABS Take 1 tablet by mouth daily at 2 PM. Patient not taking: No sig reported    [provider]     Family History  Problem Relation Age of Onset   Dementia Mother    Breast cancer Sister     Social History   Socioeconomic History   Marital status: Married    Spouse name: Not on file   Number of children: Not on file   Years of education: Not on file   Highest education level: Not on file  Occupational History   Not on file  Tobacco Use   Smoking status: Never   Smokeless tobacco: Never  Vaping Use   Vaping Use: Never used  Substance and Sexual Activity   Alcohol use: Yes    Alcohol/week: 3.0 standard drinks    Types: 3 Glasses of wine per week   Drug use: No   Sexual activity: Never  Other Topics Concern   Not on file  Social History Narrative   Not on file   Social  Determinants of Health   Financial Resource Strain: Not on file  Food Insecurity: Not on file  Transportation Needs: Not on file  Physical Activity: Not on file  Stress: Not on file  Social Connections: Not on file      Review of Systems currently denies fever, headache, chest pain, dyspnea, cough, abdominal/back pain, nausea, vomiting or bleeding.  He is hard of hearing.  Vital Signs: BP 135/66   Pulse (!) 59   Temp 97.6 F (36.4 C) (Oral)   Resp 18   SpO2 99%   Physical Exam awake, alert.  Chest clear to auscultation bilaterally.  Heart with slight bradycardic rate, occasional ectopy noted.  Abdomen soft, splenomegaly, positive bowel sounds, nontender.  No  significant lower extremity edema  Imaging: No results found.  Labs:  CBC: Recent Labs    07/12/20 1406 09/13/20 1137 09/17/20 1414 10/08/20 1106  WBC 16.4* 20.2* 20.2* 33.1*  HGB 10.7* 9.7* 9.2* 10.0*  HCT 32.5* 30.3* 28.7* 30.9*  PLT 105* 158 154 120*    COAGS: No results for input(s): INR, APTT in the last 8760 hours.  BMP: Recent Labs    07/12/20 1406 09/13/20 1137 09/13/20 1355 09/17/20 1414 10/08/20 1106  NA 142 145  --  143 140  K 4.0 3.7  --  3.8 4.3  CL 106 105  --  106 106  CO2 29 30  --  28 27  GLUCOSE 99 104*  --  117* 102*  BUN 18 38*  --  42* 23  CALCIUM 9.1 12.0* 11.6* 10.5* 9.0  CREATININE 1.02 1.84*  --  1.45* 1.11  GFRNONAA >60 35*  --  46* >60    LIVER FUNCTION TESTS: Recent Labs    07/12/20 1406 09/13/20 1137 09/17/20 1414 10/08/20 1106  BILITOT 0.6 0.4 0.4 0.5  AST _0 14*  ALT _1 ALKPHOS 74 78 66 50  PROT 7.1 6.5 6.4* 6.1*  ALBUMIN 3.7 3.3* 3.4* 3.5    TUMOR MARKERS: No results for input(s): AFPTM, CEA, CA199, CHROMGRNA in the last 8760 hours.  Assessment and Plan: 87 y.o. male with past medical history of diverticulosis, enlarged prostate, glaucoma, degenerative disc disease, melanoma, thrombocytopenia and CLL/splenomegaly with worsening anemia.  There is also concern for possible sarcoidosis.  He presents today for CT-guided bone marrow biopsy for further evaluation.Risks and benefits of procedure was discussed with the patient  including, but not limited to bleeding, infection, damage to adjacent structures or low yield requiring additional tests.  All of the questions were answered and there is agreement to proceed.  Consent signed and in chart.    Thank you for this interesting consult.  I greatly enjoyed meeting Johnwesley Lederman and look forward to participating in their care.  A copy of this report was sent to the requesting provider on this date.  Electronically Signed: D. Rowe Robert,  PA-C 10/29/2020, 10:02 AM   I spent a total of 20 minutes    in face to face in clinical consultation, greater than 50% of which was counseling/coordinating care for CT-guided bone marrow biopsy

## 2020-10-29 NOTE — Discharge Instructions (Addendum)
Do not shower x 24 hrs For any questions or concerns call [phone]  Moderate Conscious Sedation, Adult, Care After This sheet gives you information about how to care for yourself after your procedure. Your health care provider may also give you more specific instructions. If you have problems or questions, contact your health care provider. What can I expect after the procedure? After the procedure, it is common to have: Sleepiness for several hours. Impaired judgment for several hours. Difficulty with balance. Vomiting if you eat too soon. Follow these instructions at home: For the time period you were told by your health care provider:    Rest. Do not participate in activities where you could fall or become injured. Do not drive or use machinery. Do not drink alcohol. Do not take sleeping pills or medicines that cause drowsiness. Do not make important decisions or sign legal documents. Do not take care of children on your own. Eating and drinking Follow the diet recommended by your health care provider. Drink enough fluid to keep your urine pale yellow. If you vomit: Drink water, juice, or soup when you can drink without vomiting. Make sure you have little or no nausea before eating solid foods. General instructions Take over-the-counter and prescription medicines only as told by your health care provider. Have a responsible adult stay with you for the time you are told. It is important to have someone help care for you until you are awake and alert. Do not smoke. Keep all follow-up visits as told by your health care provider. This is important. Contact a health care provider if: You are still sleepy or having trouble with balance after 24 hours. You feel light-headed. You keep feeling nauseous or you keep vomiting. You develop a rash. You have a fever. You have redness or swelling around the IV site. Get help right away if: You have trouble breathing. You have  new-onset confusion at home. Summary After the procedure, it is common to feel sleepy, have impaired judgment, or feel nauseous if you eat too soon. Rest after you get home. Know the things you should not do after the procedure. Follow the diet recommended by your health care provider and drink enough fluid to keep your urine pale yellow. Get help right away if you have trouble breathing or new-onset confusion at home. This information is not intended to replace advice given to you by your health care provider. Make sure you discuss any questions you have with your health care provider. Document Revised: 04/22/2019 Document Reviewed: 11/18/2018 Elsevier Patient Education  2022 [place].  Bone Marrow Aspiration and Bone Marrow Biopsy, Adult, Care After This sheet gives you information about how to care for yourself after your procedure. Your health care provider may also give you more specific instructions. If you have problems or questions, contact your health care provider. What can I expect after the procedure? After the procedure, it is common to have: Mild pain and tenderness. Swelling. Bruising. Follow these instructions at home: Puncture site care Follow instructions from your health care provider about how to take care of the puncture site. Make sure you: Wash your hands with soap and water before and after you change your bandage (dressing). If soap and water are not available, use hand sanitizer. Change your dressing as told by your health care provider. Check your puncture site every day for signs of infection. Check for: More redness, swelling, or pain. Fluid or blood. Warmth. Pus or a bad smell. Activity Return to  your normal activities as told by your health care provider. Ask your health care provider what activities are safe for you. Do not lift anything that is heavier than 10 lb (4.5 kg), or the limit that you are told, until your health care provider says that it  is safe. Do not drive for 24 hours if you were given a sedative during your procedure. General instructions Take over-the-counter and prescription medicines only as told by your health care provider. Do not take baths, swim, or use a hot tub until your health care provider approves. Ask your health care provider if you may take showers. You may only be allowed to take sponge baths. If directed, put ice on the affected area. To do this: Put ice in a plastic bag. Place a towel between your skin and the bag. Leave the ice on for 20 minutes, 2-3 times a day. Keep all follow-up visits as told by your health care provider. This is important. Contact a health care provider if: Your pain is not controlled with medicine. You have a fever. You have more redness, swelling, or pain around the puncture site. You have fluid or blood coming from the puncture site. Your puncture site feels warm to the touch. You have pus or a bad smell coming from the puncture site. Summary After the procedure, it is common to have mild pain, tenderness, swelling, and bruising. Follow instructions from your health care provider about how to take care of the puncture site and what activities are safe for you. Take over-the-counter and prescription medicines only as told by your health care provider. Contact a health care provider if you have any signs of infection, such as fluid or blood coming from the puncture site. This information is not intended to replace advice given to you by your health care provider. Make sure you discuss any questions you have with your health care provider. Document Revised: 05/11/2018 Document Reviewed: 05/11/2018 Elsevier Patient Education  [place].

## 2020-10-29 NOTE — Procedures (Signed)
Interventional Radiology Procedure Note  Procedure: CT BM ASP AND CORE BX    Complications: None  Estimated Blood Loss:  MIN  Findings: 11 G CORE AND ASP    M. TREVOR Evalene Vath, MD    

## 2020-11-02 LAB — SURGICAL PATHOLOGY

## 2020-11-06 ENCOUNTER — Encounter (HOSPITAL_COMMUNITY): Payer: Self-pay | Admitting: Hematology

## 2020-11-10 LAB — SURGICAL PATHOLOGY

## 2020-11-19 ENCOUNTER — Other Ambulatory Visit: Payer: Self-pay

## 2020-11-19 DIAGNOSIS — C9110 Chronic lymphocytic leukemia of B-cell type not having achieved remission: Secondary | ICD-10-CM

## 2020-11-20 ENCOUNTER — Other Ambulatory Visit: Payer: Self-pay

## 2020-11-20 ENCOUNTER — Inpatient Hospital Stay: Payer: Medicare Other | Attending: Hematology | Admitting: Hematology

## 2020-11-20 ENCOUNTER — Inpatient Hospital Stay: Payer: Medicare Other

## 2020-11-20 VITALS — BP 128/54 | HR 66 | Temp 97.3°F | Resp 18 | Wt 125.3 lb

## 2020-11-20 DIAGNOSIS — C9110 Chronic lymphocytic leukemia of B-cell type not having achieved remission: Secondary | ICD-10-CM

## 2020-11-20 DIAGNOSIS — D696 Thrombocytopenia, unspecified: Secondary | ICD-10-CM

## 2020-11-20 LAB — CBC WITH DIFFERENTIAL (CANCER CENTER ONLY)
Abs Immature Granulocytes: 0.03 10*3/uL (ref 0.00–0.07)
Basophils Absolute: 0 10*3/uL (ref 0.0–0.1)
Basophils Relative: 0 %
Eosinophils Absolute: 0 10*3/uL (ref 0.0–0.5)
Eosinophils Relative: 0 %
HCT: 34.9 % — ABNORMAL LOW (ref 39.0–52.0)
Hemoglobin: 11.2 g/dL — ABNORMAL LOW (ref 13.0–17.0)
Immature Granulocytes: 0 %
Lymphocytes Relative: 79 %
Lymphs Abs: 17.2 10*3/uL — ABNORMAL HIGH (ref 0.7–4.0)
MCH: 32.8 pg (ref 26.0–34.0)
MCHC: 32.1 g/dL (ref 30.0–36.0)
MCV: 102.3 fL — ABNORMAL HIGH (ref 80.0–100.0)
Monocytes Absolute: 0.6 10*3/uL (ref 0.1–1.0)
Monocytes Relative: 3 %
Neutro Abs: 3.9 10*3/uL (ref 1.7–7.7)
Neutrophils Relative %: 18 %
Platelet Count: 116 10*3/uL — ABNORMAL LOW (ref 150–400)
RBC: 3.41 MIL/uL — ABNORMAL LOW (ref 4.22–5.81)
RDW: 15.1 % (ref 11.5–15.5)
Smear Review: NORMAL
WBC Count: 21.8 10*3/uL — ABNORMAL HIGH (ref 4.0–10.5)
nRBC: 0 % (ref 0.0–0.2)

## 2020-11-20 LAB — SAMPLE TO BLOOD BANK

## 2020-11-20 LAB — CMP (CANCER CENTER ONLY)
ALT: 15 U/L (ref 0–44)
AST: 15 U/L (ref 15–41)
Albumin: 3.7 g/dL (ref 3.5–5.0)
Alkaline Phosphatase: 48 U/L (ref 38–126)
Anion gap: 7 (ref 5–15)
BUN: 23 mg/dL (ref 8–23)
CO2: 29 mmol/L (ref 22–32)
Calcium: 9 mg/dL (ref 8.9–10.3)
Chloride: 104 mmol/L (ref 98–111)
Creatinine: 1.1 mg/dL (ref 0.61–1.24)
GFR, Estimated: >60 mL/min (ref >60)
Glucose, Bld: 147 mg/dL — ABNORMAL HIGH (ref 70–99)
Potassium: 4 mmol/L (ref 3.5–5.1)
Sodium: 140 mmol/L (ref 135–145)
Total Bilirubin: 0.3 mg/dL (ref 0.3–1.2)
Total Protein: 6.5 g/dL (ref 6.5–8.1)

## 2020-11-20 LAB — RETIC PANEL
Immature Retic Fract: 6 % (ref 2.3–15.9)
RBC.: 3.42 MIL/uL — ABNORMAL LOW (ref 4.22–5.81)
Retic Count, Absolute: 46.5 10*3/uL (ref 19.0–186.0)
Retic Ct Pct: 1.4 % (ref 0.4–3.1)
Reticulocyte Hemoglobin: 34.4 pg (ref >27.9)

## 2020-11-20 LAB — FERRITIN: Ferritin: 157 ng/mL (ref 24–336)

## 2020-11-20 LAB — LACTATE DEHYDROGENASE: LDH: 189 U/L (ref 98–192)

## 2020-11-22 ENCOUNTER — Encounter: Payer: Self-pay | Admitting: Internal Medicine

## 2020-11-22 ENCOUNTER — Ambulatory Visit: Payer: Medicare Other | Admitting: Internal Medicine

## 2020-11-22 ENCOUNTER — Other Ambulatory Visit: Payer: Self-pay

## 2020-11-22 VITALS — BP 108/62 | HR 75 | Ht 69.0 in | Wt 129.0 lb

## 2020-11-22 DIAGNOSIS — R918 Other nonspecific abnormal finding of lung field: Secondary | ICD-10-CM | POA: Diagnosis not present

## 2020-11-22 DIAGNOSIS — J849 Interstitial pulmonary disease, unspecified: Secondary | ICD-10-CM | POA: Diagnosis not present

## 2020-11-22 DIAGNOSIS — Z8639 Personal history of other endocrine, nutritional and metabolic disease: Secondary | ICD-10-CM

## 2020-11-22 DIAGNOSIS — R7989 Other specified abnormal findings of blood chemistry: Secondary | ICD-10-CM | POA: Diagnosis not present

## 2020-11-22 LAB — BASIC METABOLIC PANEL
BUN: 28 mg/dL — ABNORMAL HIGH (ref 6–23)
CO2: 32 mEq/L (ref 19–32)
Calcium: 9.4 mg/dL (ref 8.4–10.5)
Chloride: 104 mEq/L (ref 96–112)
Creatinine, Ser: 1.16 mg/dL (ref 0.40–1.50)
GFR: 56.34 mL/min — ABNORMAL LOW (ref >60.00)
Glucose, Bld: 117 mg/dL — ABNORMAL HIGH (ref 70–99)
Potassium: 4.1 mEq/L (ref 3.5–5.1)
Sodium: 141 mEq/L (ref 135–145)

## 2020-11-22 LAB — SEDIMENTATION RATE: Sed Rate: 4 mm/hr (ref 0–20)

## 2020-11-22 NOTE — Progress Notes (Signed)
OV 11/22/2020  Subjective:  Patient ID: Brandon Norris, male , DOB: 28-Jan-1930 , age 87 y.o. , MRN: [id number] , ADDRESS: [place] [id number] PCP [name], MD Patient Care Team: [name], MD as PCP - General (Internal Medicine)  This Provider for this visit: Treatment Team:  Attending Provider: Brand Males, MD    11/22/2020 -   Chief Complaint  Patient presents with   Consult    Referred by provider after reviewing a scan     HPI Brandon Norris 87 y.o. -new consult referred by Dr. Irene Limbo hematology.  He is known to have CLL stage II and follows with hematology.  [name] are some question if is truly stage II because splenomegaly is felt to be due to independent cause] most recent visit 10/08/2020 and felt to be stable with a platelets of 100 and 1K no palpable lymphadenopathy and no constitutional symptoms] history is provided by him and a review of the records which seem to correlate.  He last saw hematology prior to his recent illness on 07/12/2020.  Then seen acutely on 09/13/2020 because of fatigue, failure to thrive, decreased appetite and weight loss of 11 pounds.  He also had a cough also poor intake.  On this visit he was noted to have new acute kidney injury creatinine 1.84 mg percent and hypercalcemia with a calcium level of 12.  I was treated with fluids, steroids [currently tapering off steroids] and Xgeva.  After this is improved the cough is gone.  His appetite is better his constitutional symptoms have resolved.  He is not short of breath.  As part of this work-up onset 09/18/2020 he had a CT scan of the body.  He has some nonspecific pulmonary micronodular infiltrate that I personally visualized.  Also his vitamin D level is high.  Therefore he has been referred here with a consideration of sarcoidosis.  He says he feels great.       Latest Reference Range & Units 04/16/16 14:25 08/20/16 13:48 02/19/17 13:34 08/18/17 13:39  02/16/18 13:40 08/17/18 13:36 02/15/19 13:59 09/07/19 14:33 03/13/20 14:16 07/12/20 14:06 09/13/20 11:37 09/17/20 14:14 10/08/20 11:06 11/20/20 13:41  Calcium 8.9 - 10.3 mg/dL 9.7 9.5 9.3 9.0 9.2 9.4 9.0 9.9 9.1 9.1 12.0 (H) 10.5 (H) 9.0 9.0  (H): Data is abnormally high    Latest Reference Range & Units 09/13/20 13:55  Vit D, 1,25-Dihydroxy 24.8 - 81.5 pg/mL 108.0 (H)  Vitamin D, 25-Hydroxy 30 - 100 ng/mL 86.96  (H): Data is abnormally high  CT Chest data 913/22 - personally visualzed CLINICAL DATA:  History of CLL.   EXAM: CT CHEST, ABDOMEN AND PELVIS WITHOUT CONTRAST   TECHNIQUE: Multidetector CT imaging of the chest, abdomen and pelvis was performed following the standard protocol without IV contrast.   COMPARISON:  None.   FINDINGS: CT CHEST FINDINGS   Cardiovascular: The heart is normal in size. No pericardial effusion mild tortuosity and calcification of the thoracic aorta but focal aneurysm.   Mediastinum/Nodes: No mediastinal or hilar mass or adenopathy. There is a moderate-sized hiatal hernia containing contrast.   Lungs/Pleura: Emphysematous changes and pulmonary scarring. Diffuse pattern of centrilobular nodularity throughout both lungs. This could reflect respiratory bronchiolitis. Other possibilities would include silicosis, sarcoidosis or hypersensitivity pneumonitis. Pulmonary consultation is suggested. Comparison with any prior chest CTs may be helpful for further evaluation.   Musculoskeletal: No significant bony findings. No supraclavicular or axillary adenopathy.   CT ABDOMEN PELVIS FINDINGS  Hepatobiliary: No hepatic lesions are identified without contrast. Gallbladder is grossly normal. No common bile duct dilatation.   Pancreas: No mass, inflammation or ductal dilatation.   Spleen: Borderline splenic enlargement. Spleen measures 14.5 x 10.8 x 7 cm.   Adrenals/Urinary Tract: Adrenal glands are unremarkable. Simple right renal cyst is noted.  Small bilateral renal calculi. No worrisome renal lesions without contrast. The bladder is grossly normal.   Stomach/Bowel: The stomach, duodenum, small bowel and colon are grossly normal. No acute inflammatory process, mass lesion obstructive findings. Severe sigmoid colon diverticulosis is noted but no findings for acute diverticulitis.   Vascular/Lymphatic: Vascular calcifications but no aneurysm. No mesenteric or retroperitoneal mass or adenopathy.   Reproductive: The prostate gland seminal vesicles are unremarkable.   Other: No pelvic mass or adenopathy. No free pelvic fluid collections. No inguinal mass or adenopathy. No abdominal wall hernia or subcutaneous lesions.   Musculoskeletal: No significant bony findings. Scoliosis and degenerative changes involving the spine. No bone lesions or fractures.   IMPRESSION: 1. Emphysematous changes and pulmonary scarring. 2. Diffuse pattern of centrilobular nodularity throughout both lungs could reflect respiratory bronchiolitis. Other possibilities would include silicosis, sarcoidosis or hypersensitivity pneumonitis. Pulmonary consultation is suggested. Comparison with any prior chest CTs may be helpful for further evaluation. 3. No adenopathy involving the chest, abdomen pelvis. 4. Moderate-sized hiatal hernia. 5. Borderline splenic enlargement. 6. Small bilateral renal calculi. 7. Severe sigmoid colon diverticulosis without findings for acute diverticulitis.   Aortic Atherosclerosis (ICD10-I70.0) and Emphysema (ICD10-J43.9).     Electronically Signed   By: Marijo Sanes M.D.   On: 09/18/2020 17:03  No results found.    PFT  No flowsheet data found.  Latest Reference Range & Units 11/20/20 13:41  HCT 39.0 - 52.0 % 34.9 (L)  (L): Data is abnormally low  Latest Reference Range & Units 11/20/20 13:41  WBC 4.0 - 10.5 K/uL 21.8 (H)  (H): Data is abnormally high   has a past medical history of Actinic keratoses,  Allergic rhinitis, Atypical nevi, Diverticulosis, Enlarged prostate, Glaucoma, Glaucoma, H/O degenerative disc disease, adenomatous colonic polyps, Malignant melanoma of skin of abdomen ([place]) (2016), Rotator cuff tendinitis, and Thrombocytopenia ([place]).   reports that he has never smoked. He has never used smokeless tobacco.  Past Surgical History:  Procedure Laterality Date   HERNIA REPAIR     Left inguinal herniorrhaphy     Melanoma on abdomen  2016    No Known Allergies  Immunization History  Administered Date(s) Administered   Influenza, High Dose Seasonal PF 11/06/2014   Influenza, Quadrivalent, Recombinant, Inj, Pf 11/24/2016   Influenza-Unspecified 11/25/2012, 11/06/2017   PFIZER(Purple Top)SARS-COV-2 Vaccination 02/14/2019, 03/11/2019, 09/13/2019   Pneumococcal Conjugate-13 12/08/2013   Pneumococcal Polysaccharide-23 08/24/1997, 02/01/2019   Tdap 11/25/2012   Zoster Recombinat (Shingrix) 02/02/2018, 05/18/2018   Zoster, Live 02/27/2012    Family History  Problem Relation Age of Onset   Dementia Mother    Breast cancer Sister      Current Outpatient Medications:    b complex vitamins capsule, Take 1 capsule by mouth daily., Disp: , Rfl:    Cholecalciferol (VITAMIN D3) 50 MCG (2000 UT) TABS, Take 1 tablet by mouth daily at 2 PM., Disp: , Rfl:    dorzolamide-timolol (COSOPT) 22.3-6.8 MG/ML ophthalmic solution, Place 1 drop into both eyes 2 (two) times daily., Disp: , Rfl:    ID NOW COVID-19 KIT, TEST AS DIRECTED TODAY, Disp: , Rfl:    latanoprost (XALATAN) 0.005 % ophthalmic solution, 1 drop  into BOTH  eye in the evening, Disp: , Rfl:    Multiple Vitamin (MULTIVITAMINS PO), , Disp: , Rfl:    predniSONE (DELTASONE) 10 MG tablet, Take 1 tablet (10 mg total) by mouth daily with breakfast. After completion of 53m p daily, Disp: 30 tablet, Rfl: 0   predniSONE (DELTASONE) 20 MG tablet, TAKE 1 TABLET(20 MG) BY MOUTH DAILY WITH BREAKFAST, Disp: 30 tablet, Rfl: 0   vitamin B-12  (CYANOCOBALAMIN) 250 MCG tablet, Take 250 mcg by mouth daily., Disp: , Rfl:    timolol (BETIMOL) 0.25 % ophthalmic solution, 1 drop into BOTH EYES (Patient not taking: Reported on 07/12/2020), Disp: , Rfl:    timolol (TIMOPTIC) 0.5 % ophthalmic solution, Place 1 drop into both eyes 2 (two) times daily. (Patient not taking: Reported on 07/12/2020), Disp: , Rfl: 0   Zinc 50 MG TABS, Take 1 tablet by mouth daily at 2 PM. (Patient not taking: Reported on 07/12/2020), Disp: , Rfl:       Objective:   Vitals:   11/22/20 1348  BP: 108/62  Pulse: 75  SpO2: 99%  Weight: 129 lb (58.5 kg)  Height: _0  (1.753 m)    Estimated body mass index is 19.05 kg/m as calculated from the following:   Height as of this encounter: _1  (1.753 m).   Weight as of this encounter: 129 lb (58.5 kg).  _2 @  Filed Weights   11/22/20 1348  Weight: 129 lb (58.5 kg)     Physical Exam General: No distress. Looks well. Thin Neuro: Alert and Oriented x 3. GCS 15. Speech normal Psych: Pleasant Resp:  Barrel Chest - no.  Wheeze - no, Crackles - no, No overt respiratory distress CVS: Normal heart sounds. Murmurs - no Ext: Stigmata of Connective Tissue Disease - no HEENT: Normal upper airway. PEERL +. No post nasal drip HOH +       Assessment:       ICD-10-CM   1. Pulmonary infiltrate present on computed tomography  R91.8     2. History of hypercalcemia  Z86.39     3. High serum vitamin D  R79.89          Plan:     Patient Instructions     ICD-10-CM   1. Pulmonary infiltrate present on computed tomography  R91.8     2. History of hypercalcemia  Z86.39     3. High serum vitamin D  R79.89       Glad you are much better Reason for your illness is not fully known at this point  Plan - Do simple walking desaturation test on room air with forehead probe today -Do blood work today -ANA, rheumatoid factor, CCP, double-stranded DNA, SSA, SSB, SCL-70, GBM, PR-3, MPO, QuantiFERON gold,  angiotensin-converting enzyme, sedimentation rate. Hypersensitivity pneumonitis panel -Also blood work for chemistry and vitamin D level  - Do high-resolution CT chest supine and prone in approximately 1-2 months  Follow-up - Return in 1-2 months to see Dr. RChase Calleror nurse practitioner but after completing blood work and CT scan    SIGNATURE    Dr. MBrand Males M.D., F.C.C.P,  Pulmonary and Critical Care Medicine Staff Physician, CPleasantvilleDirector - Interstitial Lung Disease  Program  [place] [id number] Pager: [phone] If no answer or between  15:00h - 7:00h: call [phone] Telephone: [phone]  2:19 PM 11/22/2020

## 2020-11-22 NOTE — Patient Instructions (Addendum)
ICD-10-CM   1. Pulmonary infiltrate present on computed tomography  R91.8     2. History of hypercalcemia  Z86.39     3. High serum vitamin D  R79.89       Glad you are much better Reason for your illness is not fully known at this point  Plan - Do simple walking desaturation test on room air with forehead probe today -Do blood work today -ANA, rheumatoid factor, CCP, double-stranded DNA, SSA, SSB, SCL-70, GBM, PR-3, MPO, QuantiFERON gold, angiotensin-converting enzyme, sedimentation rate. Hypersensitivity pneumonitis panel -Also blood work for chemistry and vitamin D level  - Do high-resolution CT chest supine and prone in approximately 1-2 months  Follow-up - Return in 1-2 months to see Dr. Chase Caller or nurse practitioner but after completing blood work and CT scan

## 2020-11-26 ENCOUNTER — Telehealth: Payer: Self-pay | Admitting: Internal Medicine

## 2020-11-26 ENCOUNTER — Encounter: Payer: Self-pay | Admitting: Hematology

## 2020-11-26 LAB — RHEUMATOID FACTOR: Rheumatoid fact SerPl-aCnc: <14 IU/mL (ref <14)

## 2020-11-26 LAB — QUANTIFERON-TB GOLD PLUS
Mitogen-NIL: >10 IU/mL
NIL: 0.06 IU/mL
QuantiFERON-TB Gold Plus: NEGATIVE
TB1-NIL: <0 IU/mL
TB2-NIL: 0 IU/mL

## 2020-11-26 LAB — VITAMIN D 1,25 DIHYDROXY
Vitamin D 1, 25 (OH)2 Total: 37 pg/mL (ref 18–72)
Vitamin D2 1, 25 (OH)2: <8 pg/mL
Vitamin D3 1, 25 (OH)2: 37 pg/mL

## 2020-11-26 LAB — SJOGRENS SYNDROME-B EXTRACTABLE NUCLEAR ANTIBODY: SSB (La) (ENA) Antibody, IgG: <1 AI

## 2020-11-26 LAB — MPO/PR-3 (ANCA) ANTIBODIES
Myeloperoxidase Abs: <1 AI
Serine Protease 3: <1 AI

## 2020-11-26 LAB — ANTI-SCLERODERMA ANTIBODY: Scleroderma (Scl-70) (ENA) Antibody, IgG: <1 AI

## 2020-11-26 LAB — GLOMERULAR BASEMENT MEMBRANE ANTIBODIES: GBM Ab: <1 AI (ref <1.0)

## 2020-11-26 LAB — ANTI-DNA ANTIBODY, DOUBLE-STRANDED: ds DNA Ab: <1 IU/mL

## 2020-11-26 LAB — SJOGRENS SYNDROME-A EXTRACTABLE NUCLEAR ANTIBODY: SSA (Ro) (ENA) Antibody, IgG: <1 AI

## 2020-11-26 LAB — ANA: Anti Nuclear Antibody (ANA): NEGATIVE

## 2020-11-26 LAB — CYCLIC CITRUL PEPTIDE ANTIBODY, IGG: Cyclic Citrullin Peptide Ab: <16 UNITS

## 2020-11-26 NOTE — Telephone Encounter (Signed)
Scheduled for 12/28 @ 10 at [facility], check in by 9:45, no prep.  Pt made aware.

## 2020-12-03 NOTE — Progress Notes (Signed)
Brandon Norris  HEMATOLOGY ONCOLOGY PROGRESS NOTE  Date of service:  .11/20/2020   Patient Care Team: [name], MD as PCP - General (Internal Medicine)  Chief Complaint:  F/u for CLL, hypercalcemia  Diagnosis: CLL  Current Treatment: Observation  INTERVAL HISTORY:   Brandon Norris is here for follow-up of his CLL and hypercalcemia. He notes he is feeling better than he has felt for several years . No shortness of breath no chest pain no fatigue. He notes he has been having good oral intake.Brandon Norris He does have an upcoming pulmonary appointment.  Bone marrow biopsy results from 10/29/2020 showed hypercellular marrow involved with CLL/SLL No other acute new symptoms.  Labs done today 11/20/2020 show hemoglobin of 11.2 with a WBC count of 21.8k down from 34.4k, platelets of 116k.  REVIEW OF SYSTEMS:   .10 Point review of Systems was done is negative except as noted above.   Past Medical History:  Diagnosis Date   Actinic keratoses    Allergic rhinitis    Atypical nevi    Diverticulosis    Enlarged prostate    Glaucoma    Glaucoma    H/O degenerative disc disease    Hx of adenomatous colonic polyps    Malignant melanoma of skin of abdomen ([place]) 2016   Rotator cuff tendinitis    Thrombocytopenia ([place])     . Past Surgical History:  Procedure Laterality Date   HERNIA REPAIR     Left inguinal herniorrhaphy     Melanoma on abdomen  2016     Social History   Tobacco Use   Smoking status: Never   Smokeless tobacco: Never  Vaping Use   Vaping Use: Never used  Substance Use Topics   Alcohol use: Yes    Alcohol/week: 3.0 standard drinks    Types: 3 Glasses of wine per week   Drug use: No    ALLERGIES:  has No Known Allergies.  MEDICATIONS:  Current Outpatient Medications  Medication Sig Dispense Refill   b complex vitamins capsule Take 1 capsule by mouth daily.     Cholecalciferol (VITAMIN D3) 50 MCG (2000 UT) TABS Take 1 tablet by mouth daily at 2 PM.      dorzolamide-timolol (COSOPT) 22.3-6.8 MG/ML ophthalmic solution Place 1 drop into both eyes 2 (two) times daily.     latanoprost (XALATAN) 0.005 % ophthalmic solution 1 drop into BOTH  eye in the evening     Multiple Vitamin (MULTIVITAMINS PO)      predniSONE (DELTASONE) 10 MG tablet Take 1 tablet (10 mg total) by mouth daily with breakfast. After completion of 10m p daily 30 tablet 0   predniSONE (DELTASONE) 20 MG tablet TAKE 1 TABLET(20 MG) BY MOUTH DAILY WITH BREAKFAST 30 tablet 0   vitamin B-12 (CYANOCOBALAMIN) 250 MCG tablet Take 250 mcg by mouth daily.     ID NOW COVID-19 KIT TEST AS DIRECTED TODAY     timolol (BETIMOL) 0.25 % ophthalmic solution 1 drop into BOTH EYES (Patient not taking: Reported on 07/12/2020)     timolol (TIMOPTIC) 0.5 % ophthalmic solution Place 1 drop into both eyes 2 (two) times daily. (Patient not taking: Reported on 07/12/2020)  0   Zinc 50 MG TABS Take 1 tablet by mouth daily at 2 PM. (Patient not taking: Reported on 07/12/2020)     No current facility-administered medications for this visit.    PHYSICAL EXAMINATION: Telemedicine visit LABORATORY DATA:   I have reviewed the data as listed  . CBC  Latest Ref Rng & Units 11/20/2020 10/29/2020 10/08/2020  WBC 4.0 - 10.5 K/uL 21.8(H) 34.4(H) 33.1(H)  Hemoglobin 13.0 - 17.0 g/dL 11.2(L) 11.2(L) 10.0(L)  Hematocrit 39.0 - 52.0 % 34.9(L) 35.2(L) 30.9(L)  Platelets 150 - 400 K/uL 116(L) 121(L) 120(L)    . CMP Latest Ref Rng & Units 11/22/2020 11/20/2020 10/08/2020  Glucose 70 - 99 mg/dL 117(H) 147(H) 102(H)  BUN 6 - 23 mg/dL 28(H) 23 23  Creatinine 0.40 - 1.50 mg/dL 1.16 1.10 1.11  Sodium 135 - 145 mEq/L 141 140 140  Potassium 3.5 - 5.1 mEq/L 4.1 4.0 4.3  Chloride 96 - 112 mEq/L 104 104 106  CO2 19 - 32 mEq/L 32 29 27  Calcium 8.4 - 10.5 mg/dL 9.4 9.0 9.0  Total Protein 6.5 - 8.1 g/dL - 6.5 6.1(L)  Total Bilirubin 0.3 - 1.2 mg/dL - 0.3 0.5  Alkaline Phos 38 - 126 U/L - 48 50  AST 15 - 41 U/L - 15 14(L)  ALT 0  - 44 U/L - 15 16   Component     Latest Ref Rng & Units 09/13/2020  Retic Ct Pct     0.4 - 3.1 % 1.6  RBC.     4.22 - 5.81 MIL/uL 3.08 (L)  Retic Count, Absolute     19.0 - 186.0 K/uL 49.9  Immature Retic Fract     2.3 - 15.9 % 6.8  Reticulocyte Hemoglobin     >27.9 pg 34.8  Vitamin B12     180 - 914 pg/mL 281  LDH     98 - 192 U/L 226 (H)  Ferritin     24 - 336 ng/mL 322   [place] ABD (01/28/2016)  ABDOMEN ULTRASOUND COMPLETE   COMPARISON:  None.   FINDINGS: Gallbladder: No gallstones or wall thickening visualized. There is no pericholecystic fluid. No sonographic Murphy sign noted by sonographer.   Common bile duct: Diameter: 2 mm. There is no intrahepatic, common hepatic, or common bile duct dilatation.   Liver: No focal lesion identified. Within normal limits in parenchymal echogenicity.   IVC: No abnormality visualized.   Pancreas: Visualized portion unremarkable. Portions of pancreas obscured by gas.   Spleen: Spleen measures 16.0 x 14.2 x 5.8 cm with a measures splenic volume of 621 cubic cm. No focal splenic lesions are evident.   Right Kidney: Length: 11.4 cm. Echogenicity within normal limits. No hydronephrosis visualized. There is a parapelvic cyst in the left kidney measuring 5.1 x 3.5 x 6.3 cm.   Left Kidney: Length: 11.1 cm. Echogenicity within normal limits. No mass or hydronephrosis visualized.   Abdominal aorta: No aneurysm visualized.   Other findings: No demonstrable ascites.   IMPRESSION: Splenomegaly.  No focal splenic lesions evident.   Parapelvic cyst right kidney measuring 5.1 x 3.5 x 6.3 cm.   Portions of pancreas obscured by gas. Visualized portions of pancreas appear normal.   Study otherwise unremarkable.     Electronically Signed   By: Lowella Grip III M.D.   On: 01/28/2016 14:09  FISH, CLL Prognostic on 08/26/16   RADIOGRAPHIC STUDIES: I have personally reviewed the radiological images as listed and agreed with the  findings in the report. No results found.  ASSESSMENT & PLAN:   87 y.o. male with  1) Rai Stage 2 Chronic lymphocytic leukemia. (with splenomegaly) Trisomy 12 and a 13q deletion.  Would be stage II if splenomegaly was considered to be related to the CLL. Patient however notes that he has had splenomegaly  for decades without clear etiology noted previously.  WBC counts are not changed significantly. No anemia Mild thrombocytopenia that is not significantly changed platelets around 101k.  No constitutional symptoms. No issues with infections/bleeding. No clinically palpable lymphadenopathy ( US showed enlarged spleen 16x14.2x5.8 cms)  2) new worsening grade 2-3 fatigue  3) anorexia with weight loss of 11 pounds over the last 2 months.  4) new hypercalcemia with calcium of 12 with acute kidney injury with a creatinine of 1.84.- now resolved  5) macrocytic anemia B12 borderline low at 281 up from 233. Has been taking B complex 1 capsule daily Would recommend taking additional B12 1000 mcg daily over-the-counter.  PLAN: -Discussed pt labwork tabs today 11/20/2020.  Hypercalcemia has resolved.  Hemoglobin improved. -CT bone marrow biopsy consistent with involvement with CLL -Bone scan with no overt bone lesions -Awaiting pulmonology consult to evaluate for sarcoidosis especially in the context of elevated 1,25-hydroxy vitamin D levels and lung findings. -Continue prednisone 20 mg p.o. daily for another 1 to 2 weeks and then we will taper down to 10 mg  6) Incidentally noted left parapelvic right kidney cyst measuring 5.1 x 3.5 x 6.3 cm in size.  Plan Ct abd- 9/13-- simple rt renal cyst noted. No concerning renal lesions.   7) . Patient Active Problem List   Diagnosis Date Noted   Hypercalcemia 09/13/2020   CLL (chronic lymphocytic leukemia) ([place]) 01/21/2016   Elevated WBC count 01/16/2016   Glaucoma 01/16/2016   Thrombocytopenia ([place]) 01/16/2016   Diverticulosis 01/16/2016    Encounter for colonoscopy due to history of adenomatous colonic polyps 01/16/2016   Allergic rhinitis 01/16/2016   Plan -f/u with PCP for mx   FOLLOW UP: RTC with Dr Irene Limbo with labs in 2 months  . The total time spent in the appointment was 25 minutes and more than 50% was on counseling and direct patient cares.     All of the patient's questions were answered with apparent satisfaction. The patient knows to call the clinic with any problems, questions or concerns.    Sullivan Lone MD [place] AAHIVMS [facility] Hematology/Oncology Physician [facility]  (Office):       [phone] (Work cell):  [phone] (Fax):           [phone]

## 2020-12-25 DIAGNOSIS — H401133 Primary open-angle glaucoma, bilateral, severe stage: Secondary | ICD-10-CM | POA: Diagnosis not present

## 2021-01-02 ENCOUNTER — Other Ambulatory Visit: Payer: Self-pay

## 2021-01-02 ENCOUNTER — Ambulatory Visit (INDEPENDENT_AMBULATORY_CARE_PROVIDER_SITE_OTHER)
Admission: RE | Admit: 2021-01-02 | Discharge: 2021-01-02 | Disposition: A | Discharge status: Home / Self Care | Payer: Medicare Other | Source: Ambulatory Visit | Attending: Internal Medicine | Admitting: Internal Medicine

## 2021-01-02 ENCOUNTER — Other Ambulatory Visit: Payer: Medicare Other

## 2021-01-02 DIAGNOSIS — J849 Interstitial pulmonary disease, unspecified: Secondary | ICD-10-CM | POA: Diagnosis not present

## 2021-01-02 DIAGNOSIS — J479 Bronchiectasis, uncomplicated: Secondary | ICD-10-CM | POA: Diagnosis not present

## 2021-01-02 DIAGNOSIS — R918 Other nonspecific abnormal finding of lung field: Secondary | ICD-10-CM | POA: Diagnosis not present

## 2021-01-02 DIAGNOSIS — I700 Atherosclerosis of aorta: Secondary | ICD-10-CM | POA: Diagnosis not present

## 2021-01-08 ENCOUNTER — Other Ambulatory Visit: Payer: Medicare Other

## 2021-01-08 ENCOUNTER — Ambulatory Visit: Payer: Medicare Other | Admitting: Hematology

## 2021-01-21 ENCOUNTER — Other Ambulatory Visit: Payer: Self-pay

## 2021-01-21 DIAGNOSIS — C9110 Chronic lymphocytic leukemia of B-cell type not having achieved remission: Secondary | ICD-10-CM

## 2021-01-22 ENCOUNTER — Other Ambulatory Visit: Payer: Medicare Other

## 2021-01-22 ENCOUNTER — Ambulatory Visit: Payer: Medicare Other | Admitting: Hematology

## 2021-01-28 ENCOUNTER — Other Ambulatory Visit: Payer: Self-pay

## 2021-01-28 ENCOUNTER — Inpatient Hospital Stay (HOSPITAL_BASED_OUTPATIENT_CLINIC_OR_DEPARTMENT_OTHER): Payer: Medicare Other | Admitting: Hematology

## 2021-01-28 ENCOUNTER — Inpatient Hospital Stay: Payer: Medicare Other | Attending: Hematology

## 2021-01-28 VITALS — BP 127/58 | HR 65 | Temp 97.5°F | Resp 18 | Wt 132.1 lb

## 2021-01-28 DIAGNOSIS — C9110 Chronic lymphocytic leukemia of B-cell type not having achieved remission: Secondary | ICD-10-CM | POA: Diagnosis not present

## 2021-01-28 DIAGNOSIS — D696 Thrombocytopenia, unspecified: Secondary | ICD-10-CM | POA: Diagnosis not present

## 2021-01-28 DIAGNOSIS — D509 Iron deficiency anemia, unspecified: Secondary | ICD-10-CM | POA: Insufficient documentation

## 2021-01-28 LAB — CBC WITH DIFFERENTIAL (CANCER CENTER ONLY)
Abs Immature Granulocytes: 0 10*3/uL (ref 0.00–0.07)
Basophils Absolute: 0 10*3/uL (ref 0.0–0.1)
Basophils Relative: 0 %
Eosinophils Absolute: 0.2 10*3/uL (ref 0.0–0.5)
Eosinophils Relative: 1 %
HCT: 35 % — ABNORMAL LOW (ref 39.0–52.0)
Hemoglobin: 11 g/dL — ABNORMAL LOW (ref 13.0–17.0)
Lymphocytes Relative: 89 %
Lymphs Abs: 18.4 10*3/uL — ABNORMAL HIGH (ref 0.7–4.0)
MCH: 32.4 pg (ref 26.0–34.0)
MCHC: 31.4 g/dL (ref 30.0–36.0)
MCV: 103.2 fL — ABNORMAL HIGH (ref 80.0–100.0)
Monocytes Absolute: 0.2 10*3/uL (ref 0.1–1.0)
Monocytes Relative: 1 %
Neutro Abs: 1.9 10*3/uL (ref 1.7–7.7)
Neutrophils Relative %: 9 %
Platelet Count: 103 10*3/uL — ABNORMAL LOW (ref 150–400)
RBC: 3.39 MIL/uL — ABNORMAL LOW (ref 4.22–5.81)
RDW: 15 % (ref 11.5–15.5)
WBC Count: 20.7 10*3/uL — ABNORMAL HIGH (ref 4.0–10.5)
nRBC: 0 % (ref 0.0–0.2)

## 2021-01-28 LAB — RETIC PANEL
Immature Retic Fract: 11 % (ref 2.3–15.9)
RBC.: 3.24 MIL/uL — ABNORMAL LOW (ref 4.22–5.81)
Retic Count, Absolute: 51.8 10*3/uL (ref 19.0–186.0)
Retic Ct Pct: 1.6 % (ref 0.4–3.1)
Reticulocyte Hemoglobin: 36.2 pg (ref >27.9)

## 2021-01-28 LAB — CMP (CANCER CENTER ONLY)
ALT: 18 U/L (ref 0–44)
AST: 18 U/L (ref 15–41)
Albumin: 3.9 g/dL (ref 3.5–5.0)
Alkaline Phosphatase: 45 U/L (ref 38–126)
Anion gap: 5 (ref 5–15)
BUN: 25 mg/dL — ABNORMAL HIGH (ref 8–23)
CO2: 29 mmol/L (ref 22–32)
Calcium: 8.8 mg/dL — ABNORMAL LOW (ref 8.9–10.3)
Chloride: 108 mmol/L (ref 98–111)
Creatinine: 1.03 mg/dL (ref 0.61–1.24)
GFR, Estimated: >60 mL/min (ref >60)
Glucose, Bld: 77 mg/dL (ref 70–99)
Potassium: 4.4 mmol/L (ref 3.5–5.1)
Sodium: 142 mmol/L (ref 135–145)
Total Bilirubin: 0.4 mg/dL (ref 0.3–1.2)
Total Protein: 7 g/dL (ref 6.5–8.1)

## 2021-01-28 LAB — SAMPLE TO BLOOD BANK

## 2021-01-28 LAB — FERRITIN: Ferritin: 78 ng/mL (ref 24–336)

## 2021-01-28 LAB — LACTATE DEHYDROGENASE: LDH: 179 U/L (ref 98–192)

## 2021-01-28 NOTE — Progress Notes (Signed)
[name]  HEMATOLOGY ONCOLOGY PROGRESS NOTE  Date of service:  .01/28/2021   Patient Care Team: [name], MD as PCP - General (Internal Medicine)  Chief Complaint:  Follow-up for continued management of CLL  Diagnosis: CLL  Current Treatment: Monitoring  INTERVAL HISTORY:   Brandon Norris is here for continued evaluation and management of his CLL. He has lost his wife since his last clinic visit.  She had been through a while and had significant dementia.  He has been coping well and had his 2 sons with him at home for a bit for support.  He notes no new physical symptoms.  No fevers no chills no night sweats no unexpected weight loss. No new lumps or bumps. He has gained about 7 pounds since November and has been eating well. CBC shows stable hemoglobin of 11, WBC count of 20.7k , platelets 103k. CMP LDH ferritin currently pending.   REVIEW OF SYSTEMS:   .10 Point review of Systems was done is negative except as noted above.    Past Medical History:  Diagnosis Date   Actinic keratoses    Allergic rhinitis    Atypical nevi    Diverticulosis    Enlarged prostate    Glaucoma    Glaucoma    H/O degenerative disc disease    Hx of adenomatous colonic polyps    Malignant melanoma of skin of abdomen ([place]) 2016   Rotator cuff tendinitis    Thrombocytopenia ([place])     . Past Surgical History:  Procedure Laterality Date   HERNIA REPAIR     Left inguinal herniorrhaphy     Melanoma on abdomen  2016     Social History   Tobacco Use   Smoking status: Never   Smokeless tobacco: Never  Vaping Use   Vaping Use: Never used  Substance Use Topics   Alcohol use: Yes    Alcohol/week: 3.0 standard drinks    Types: 3 Glasses of wine per week   Drug use: No    ALLERGIES:  has No Known Allergies.  MEDICATIONS:  Current Outpatient Medications  Medication Sig Dispense Refill   b complex vitamins capsule Take 1 capsule by mouth daily.     Cholecalciferol (VITAMIN  D3) 50 MCG (2000 UT) TABS Take 1 tablet by mouth daily at 2 PM.     dorzolamide-timolol (COSOPT) 22.3-6.8 MG/ML ophthalmic solution Place 1 drop into both eyes 2 (two) times daily.     ID NOW COVID-19 KIT TEST AS DIRECTED TODAY     latanoprost (XALATAN) 0.005 % ophthalmic solution 1 drop into BOTH  eye in the evening     Multiple Vitamin (MULTIVITAMINS PO)      predniSONE (DELTASONE) 10 MG tablet Take 1 tablet (10 mg total) by mouth daily with breakfast. After completion of 36m p daily 30 tablet 0   predniSONE (DELTASONE) 20 MG tablet TAKE 1 TABLET(20 MG) BY MOUTH DAILY WITH BREAKFAST 30 tablet 0   timolol (BETIMOL) 0.25 % ophthalmic solution 1 drop into BOTH EYES (Patient not taking: Reported on 07/12/2020)     timolol (TIMOPTIC) 0.5 % ophthalmic solution Place 1 drop into both eyes 2 (two) times daily. (Patient not taking: Reported on 07/12/2020)  0   vitamin B-12 (CYANOCOBALAMIN) 250 MCG tablet Take 250 mcg by mouth daily.     Zinc 50 MG TABS Take 1 tablet by mouth daily at 2 PM. (Patient not taking: Reported on 07/12/2020)     No current facility-administered medications for  this visit.    PHYSICAL EXAMINATION: .BP (!) 127/58    Pulse 65    Temp (!) 97.5 F (36.4 C)    Resp 18    Wt 132 lb 1.6 oz (59.9 kg)    SpO2 100%    BMI 19.51 kg/m  . GENERAL:alert, in no acute distress and comfortable SKIN: no acute rashes, no significant lesions EYES: conjunctiva are pink and non-injected, sclera anicteric OROPHARYNX: MMM, no exudates, no oropharyngeal erythema or ulceration NECK: supple, no JVD LYMPH:  no palpable lymphadenopathy in the cervical, axillary or inguinal regions LUNGS: clear to auscultation b/l with normal respiratory effort HEART: regular rate & rhythm ABDOMEN:  normoactive bowel sounds , non tender, not distended. No palpable hepatosplenomegaly. Extremity: no pedal edema PSYCH: alert & oriented x 3 with fluent speech NEURO: no focal motor/sensory deficits  LABORATORY DATA:   I  have reviewed the data as listed  . CBC Latest Ref Rng & Units 01/28/2021 11/20/2020 10/29/2020  WBC 4.0 - 10.5 K/uL 20.7(H) 21.8(H) 34.4(H)  Hemoglobin 13.0 - 17.0 g/dL 11.0(L) 11.2(L) 11.2(L)  Hematocrit 39.0 - 52.0 % 35.0(L) 34.9(L) 35.2(L)  Platelets 150 - 400 K/uL 103(L) 116(L) 121(L)    . CMP Latest Ref Rng & Units 11/22/2020 11/20/2020 10/08/2020  Glucose 70 - 99 mg/dL 117(H) 147(H) 102(H)  BUN 6 - 23 mg/dL 28(H) 23 23  Creatinine 0.40 - 1.50 mg/dL 1.16 1.10 1.11  Sodium 135 - 145 mEq/L 141 140 140  Potassium 3.5 - 5.1 mEq/L 4.1 4.0 4.3  Chloride 96 - 112 mEq/L 104 104 106  CO2 19 - 32 mEq/L 32 29 27  Calcium 8.4 - 10.5 mg/dL 9.4 9.0 9.0  Total Protein 6.5 - 8.1 g/dL - 6.5 6.1(L)  Total Bilirubin 0.3 - 1.2 mg/dL - 0.3 0.5  Alkaline Phos 38 - 126 U/L - 48 50  AST 15 - 41 U/L - 15 14(L)  ALT 0 - 44 U/L - 15 16   Component     Latest Ref Rng & Units 09/13/2020  Retic Ct Pct     0.4 - 3.1 % 1.6  RBC.     4.22 - 5.81 MIL/uL 3.08 (L)  Retic Count, Absolute     19.0 - 186.0 K/uL 49.9  Immature Retic Fract     2.3 - 15.9 % 6.8  Reticulocyte Hemoglobin     >27.9 pg 34.8  Vitamin B12     180 - 914 pg/mL 281  LDH     98 - 192 U/L 226 (H)  Ferritin     24 - 336 ng/mL 322   [place] ABD (01/28/2016)  ABDOMEN ULTRASOUND COMPLETE   COMPARISON:  None.   FINDINGS: Gallbladder: No gallstones or wall thickening visualized. There is no pericholecystic fluid. No sonographic Murphy sign noted by sonographer.   Common bile duct: Diameter: 2 mm. There is no intrahepatic, common hepatic, or common bile duct dilatation.   Liver: No focal lesion identified. Within normal limits in parenchymal echogenicity.   IVC: No abnormality visualized.   Pancreas: Visualized portion unremarkable. Portions of pancreas obscured by gas.   Spleen: Spleen measures 16.0 x 14.2 x 5.8 cm with a measures splenic volume of 621 cubic cm. No focal splenic lesions are evident.   Right Kidney: Length:  11.4 cm. Echogenicity within normal limits. No hydronephrosis visualized. There is a parapelvic cyst in the left kidney measuring 5.1 x 3.5 x 6.3 cm.   Left Kidney: Length: 11.1 cm. Echogenicity within normal limits.  No mass or hydronephrosis visualized.   Abdominal aorta: No aneurysm visualized.   Other findings: No demonstrable ascites.   IMPRESSION: Splenomegaly.  No focal splenic lesions evident.   Parapelvic cyst right kidney measuring 5.1 x 3.5 x 6.3 cm.   Portions of pancreas obscured by gas. Visualized portions of pancreas appear normal.   Study otherwise unremarkable.     Electronically Signed   By: Lowella Grip III M.D.   On: 01/28/2016 14:09  FISH, CLL Prognostic on 08/26/16   RADIOGRAPHIC STUDIES: I have personally reviewed the radiological images as listed and agreed with the findings in the report. CT Chest High Resolution  Result Date: 01/03/2021 CLINICAL DATA:  88 year old male with history of increasing shortness of breath. Evaluate for interstitial lung disease. EXAM: CT CHEST WITHOUT CONTRAST TECHNIQUE: Multidetector CT imaging of the chest was performed following the standard protocol without intravenous contrast. High resolution imaging of the lungs, as well as inspiratory and expiratory imaging, was performed. COMPARISON:  Chest CT 09/18/2020. FINDINGS: Cardiovascular: Heart size is normal. There is no significant pericardial fluid, thickening or pericardial calcification. Aortic atherosclerosis. No definite coronary artery calcifications. Mediastinum/Nodes: No pathologically enlarged mediastinal or hilar lymph nodes. Please note that accurate exclusion of hilar adenopathy is limited on noncontrast CT scans. Severe circumferential thickening of the distal third of the esophagus immediately before the esophageal hiatus, mass-like in appearance, measuring approximally 3.8 x 3.5 cm. Lungs/Pleura: There continues to be some patchy areas of ground-glass  attenuation, septal thickening, mild cylindrical bronchiectasis, peripheral bronchiolectasis, thickening of the peribronchovascular interstitium and peribronchovascular micro and macro nodularity scattered throughout the lungs bilaterally, with a definitive craniocaudal gradient. Overall, findings appear minimally improved compared to the prior study. No frank honeycombing. Inspiratory and expiratory imaging is unremarkable. No confluent consolidative airspace disease. No pleural effusions. Upper Abdomen: Low-attenuation lesion in the upper pole of the right kidney measuring 4.6 x 3.7 cm, incompletely characterized on today's non-contrast CT examination, but similar to the prior study and statistically likely to represent a cyst. Aortic atherosclerosis. Musculoskeletal: There are no aggressive appearing lytic or blastic lesions noted in the visualized portions of the skeleton. IMPRESSION: 1. Slight improvement in the appearance of the lungs compared to the prior examination, with an overall spectrum of findings favored to reflect a chronic indolent atypical infectious process such as MAI (mycobacterium avium intracellulare) rather than interstitial lung disease. 2. Aortic atherosclerosis. 3. Mass-like appearance of the distal third of the esophagus immediately before the esophageal hiatus. This is poorly evaluated on today's noncontrast CT examination. A moderate-sized hiatal hernia was noted in this region on the prior examination, and this may simply represent a decompressed hiatal hernia, however, the possibility of gastric and/or distal esophageal neoplasm is not excluded, and further evaluation with nonemergent endoscopy should be considered if clinically appropriate. Aortic Atherosclerosis (ICD10-I70.0). Electronically Signed   By: Vinnie Langton M.D.   On: 01/03/2021 11:04    ASSESSMENT & PLAN:   88 y.o. male with  1) Rai Stage 2 Chronic lymphocytic leukemia. (with splenomegaly) Trisomy 12 and a 13q  deletion.  Would be stage II if splenomegaly was considered to be related to the CLL. Patient however notes that he has had splenomegaly for decades without clear etiology noted previously.  WBC counts are not changed significantly. No anemia Mild thrombocytopenia that is not significantly changed platelets around 101k.  No constitutional symptoms. No issues with infections/bleeding. No clinically palpable lymphadenopathy ( US showed enlarged spleen 16x14.2x5.8 cms)  2) status post anorexia  with weight loss.  Patient is now eating much better and has gained 7 pounds in the last 2 to 3 months.  Resolved with steroids  3) previous hypercalcemia and elevated creatinine which had resolved.  Resolved with steroids  4) macrocytic anemia B12 borderline low at 281 up from 233. Has been taking B complex 1 capsule daily Would recommend taking additional B12 1000 mcg daily over-the-counter.  PLAN: -Patient's available labs discussed.  CBC stable with mild anemia hemoglobin 11, WBC count stable at 20k and platelets 103k -Other labs currently pending. -Patient has no clinical evidence of CLL progression at this time. -No constitutional symptoms. -No significant palpable lymphadenopathy and hepatosplenomegaly -Currently undergoing pulmonary evaluation to rule out sarcoidosis in the context of elevated 1, 25 hydroxy vitamin D levels and lung findings.  Patient notes he has a upcoming follow-up with Dr. Chase Caller. -Patient has completed his prednisone with resolution of his hypercalcemia and anorexia.  6) Incidentally noted left parapelvic right kidney cyst measuring 5.1 x 3.5 x 6.3 cm in size. Follow-up with PCP with continued management return to clinic with Dr. Irene Limbo with labs in 3 months plan Ct abd- 9/13-- simple rt renal cyst noted. No concerning renal lesions.   7) . Patient Active Problem List   Diagnosis Date Noted   Hypercalcemia 09/13/2020   CLL (chronic lymphocytic leukemia) ([place])  01/21/2016   Elevated WBC count 01/16/2016   Glaucoma 01/16/2016   Thrombocytopenia ([place]) 01/16/2016   Diverticulosis 01/16/2016   Encounter for colonoscopy due to history of adenomatous colonic polyps 01/16/2016   Allergic rhinitis 01/16/2016   Plan -f/u with PCP for mx   FOLLOW UP: RTC with Dr Irene Limbo with labs in 3 months   All of the patient's questions were answered with apparent satisfaction. The patient knows to call the clinic with any problems, questions or concerns.   Sullivan Lone MD [place] AAHIVMS [facility] Hematology/Oncology Physician [facility]

## 2021-01-30 ENCOUNTER — Telehealth: Payer: Self-pay | Admitting: Hematology

## 2021-01-30 NOTE — Telephone Encounter (Signed)
Scheduled follow-up appointment per 1/23 los. Patient is aware.

## 2021-02-05 ENCOUNTER — Other Ambulatory Visit: Payer: Self-pay

## 2021-02-05 ENCOUNTER — Ambulatory Visit: Payer: Medicare Other | Admitting: Internal Medicine

## 2021-02-05 ENCOUNTER — Encounter: Payer: Self-pay | Admitting: Internal Medicine

## 2021-02-05 VITALS — BP 122/68 | HR 62 | Temp 97.7°F | Ht 69.0 in | Wt 132.8 lb

## 2021-02-05 DIAGNOSIS — Z8639 Personal history of other endocrine, nutritional and metabolic disease: Secondary | ICD-10-CM

## 2021-02-05 DIAGNOSIS — R918 Other nonspecific abnormal finding of lung field: Secondary | ICD-10-CM | POA: Diagnosis not present

## 2021-02-05 DIAGNOSIS — K449 Diaphragmatic hernia without obstruction or gangrene: Secondary | ICD-10-CM

## 2021-02-05 DIAGNOSIS — R1310 Dysphagia, unspecified: Secondary | ICD-10-CM

## 2021-02-05 DIAGNOSIS — R7989 Other specified abnormal findings of blood chemistry: Secondary | ICD-10-CM | POA: Diagnosis not present

## 2021-02-05 DIAGNOSIS — K2289 Other specified disease of esophagus: Secondary | ICD-10-CM

## 2021-02-05 NOTE — Progress Notes (Signed)
OV 11/22/2020  Subjective:  Patient ID: Brandon Norris, male , DOB: 1930/08/12 , age 88 y.o. , MRN: [id number] , ADDRESS: [place] [id number] PCP Brandon Pretty, MD Patient Care Team: Brandon Pretty, MD as PCP - General (Internal Medicine)  This Provider for this visit: Treatment Team:  Attending Provider: Brand Males, MD    11/22/2020 -   Chief Complaint  Patient presents with   Consult    Referred by provider after reviewing a scan     HPI Brandon Norris 88 y.o. -new consult referred by Dr. Irene Norris hematology.  He is known to have CLL stage II and follows with hematology.  Brandon Norris are some question if is truly stage II because splenomegaly is felt to be due to independent cause] most recent visit 10/08/2020 and felt to be stable with a platelets of 100 and 1K no palpable lymphadenopathy and no constitutional symptoms] history is provided by him and a review of the records which seem to correlate.  He last saw hematology prior to his recent illness on 07/12/2020.  Then seen acutely on 09/13/2020 because of fatigue, failure to thrive, decreased appetite and weight loss of 11 pounds.  He also had a cough also poor intake.  On this visit he was noted to have new acute kidney injury creatinine 1.84 mg percent and hypercalcemia with a calcium level of 12.  I was treated with fluids, steroids [currently tapering off steroids] and Xgeva.  After this is improved the cough is gone.  His appetite is better his constitutional symptoms have resolved.  He is not short of breath.  As part of this work-up onset 09/18/2020 he had a CT scan of the body.  He has some nonspecific pulmonary micronodular infiltrate that I personally visualized.  Also his vitamin D level is high.  Therefore he has been referred here with a consideration of sarcoidosis.  He says he feels great.       Latest Reference Range & Units 04/16/16 14:25 08/20/16 13:48 02/19/17 13:34 08/18/17 13:39  02/16/18 13:40 08/17/18 13:36 02/15/19 13:59 09/07/19 14:33 03/13/20 14:16 07/12/20 14:06 09/13/20 11:37 09/17/20 14:14 10/08/20 11:06 11/20/20 13:41  Calcium 8.9 - 10.3 mg/dL 9.7 9.5 9.3 9.0 9.2 9.4 9.0 9.9 9.1 9.1 12.0 (H) 10.5 (H) 9.0 9.0  (H): Data is abnormally high    Latest Reference Range & Units 09/13/20 13:55  Vit D, 1,25-Dihydroxy 24.8 - 81.5 pg/mL 108.0 (H)  Vitamin D, 25-Hydroxy 30 - 100 ng/mL 86.96  (H): Data is abnormally high  CT Chest data 913/22 - personally visualzed CLINICAL DATA:  History of CLL.   EXAM: CT CHEST, ABDOMEN AND PELVIS WITHOUT CONTRAST   TECHNIQUE: Multidetector CT imaging of the chest, abdomen and pelvis was performed following the standard protocol without IV contrast.   COMPARISON:  None.   FINDINGS: CT CHEST FINDINGS   Cardiovascular: The heart is normal in size. No pericardial effusion mild tortuosity and calcification of the thoracic aorta but focal aneurysm.   Mediastinum/Nodes: No mediastinal or hilar mass or adenopathy. There is a moderate-sized hiatal hernia containing contrast.   Lungs/Pleura: Emphysematous changes and pulmonary scarring. Diffuse pattern of centrilobular nodularity throughout both lungs. This could reflect respiratory bronchiolitis. Other possibilities would include silicosis, sarcoidosis or hypersensitivity pneumonitis. Pulmonary consultation is suggested. Comparison with any prior chest CTs may be helpful for further evaluation.   Musculoskeletal: No significant bony findings. No supraclavicular or axillary adenopathy.   CT ABDOMEN PELVIS FINDINGS  Hepatobiliary: No hepatic lesions are identified without contrast. Gallbladder is grossly normal. No common bile duct dilatation.   Pancreas: No mass, inflammation or ductal dilatation.   Spleen: Borderline splenic enlargement. Spleen measures 14.5 x 10.8 x 7 cm.   Adrenals/Urinary Tract: Adrenal glands are unremarkable. Simple right renal cyst is noted.  Small bilateral renal calculi. No worrisome renal lesions without contrast. The bladder is grossly normal.   Stomach/Bowel: The stomach, duodenum, small bowel and colon are grossly normal. No acute inflammatory process, mass lesion obstructive findings. Severe sigmoid colon diverticulosis is noted but no findings for acute diverticulitis.   Vascular/Lymphatic: Vascular calcifications but no aneurysm. No mesenteric or retroperitoneal mass or adenopathy.   Reproductive: The prostate gland seminal vesicles are unremarkable.   Other: No pelvic mass or adenopathy. No free pelvic fluid collections. No inguinal mass or adenopathy. No abdominal wall hernia or subcutaneous lesions.   Musculoskeletal: No significant bony findings. Scoliosis and degenerative changes involving the spine. No bone lesions or fractures.   IMPRESSION: 1. Emphysematous changes and pulmonary scarring. 2. Diffuse pattern of centrilobular nodularity throughout both lungs could reflect respiratory bronchiolitis. Other possibilities would include silicosis, sarcoidosis or hypersensitivity pneumonitis. Pulmonary consultation is suggested. Comparison with any prior chest CTs may be helpful for further evaluation. 3. No adenopathy involving the chest, abdomen pelvis. 4. Moderate-sized hiatal hernia. 5. Borderline splenic enlargement. 6. Small bilateral renal calculi. 7. Severe sigmoid colon diverticulosis without findings for acute diverticulitis.   Aortic Atherosclerosis (ICD10-I70.0) and Emphysema (ICD10-J43.9).     Electronically Signed   By: Marijo Sanes M.D.   On: 09/18/2020 17:03  No results found.    PFT  No flowsheet data found.  Latest Reference Range & Units 11/20/20 13:41  HCT 39.0 - 52.0 % 34.9 (L)  (L): Data is abnormally low  Latest Reference Range & Units 11/20/20 13:41  WBC 4.0 - 10.5 K/uL 21.8 (H)  (H): Data is abnormally high   OV 02/05/2021  Subjective:  Patient ID: Brandon Norris, male , DOB: 12-Feb-1930 , age 88 y.o. , MRN: [id number] , ADDRESS: [place] [id number] PCP Brandon Pretty, MD Patient Care Team: Brandon Pretty, MD as PCP - General (Internal Medicine)  This Provider for this visit: Treatment Team:  Attending Provider: Brand Males, MD    02/05/2021 -   Chief Complaint  Patient presents with   Follow-up    Pt is following up after recent CT.  Pt states he has been doing okay since last visit and denies any complaints.   Follow-up for concern of sarcoidosis in the setting of pulm infiltrates in the setting of high vitamin D High vitamin D followed by Dr. Irene Norris CLL followed by Dr. Irene Norris  HPI Brandon Norris 88 y.o. -returns for follow-up.  There is concern of ILD.  We did serology, QuantiFERON gold but this was all negative.  He did have a high-resolution CT chest.  Radiology sees the infiltrates with a think that more of MAI pattern than ILD.  He had repeat vitamin D and this is normal.  He has seen Dr. Irene Norris and the patient is doing well from a CLL standpoint.  In fact patient tells me at this point he is not having any shortness of breath.  He has gained weight.  He is more fit.  He is more functional.  His wife died from advanced dementia approximately 2 weeks ago.  He says he has good social support.  In talking to him  he did admit for the last year or 2 he has had dysphagia particularly for rice and broccoli.  He says he likes to eat a lot of broccoli and periodically the food will get stuck and he has to vomit it out.  The CT scan is raising the question of esophageal mass in the place where the used to be hiatal hernia.  He tells me that Dr. Cristina Gong used to be his GI.  I have now referred him to Dr. Therisa Doyne -I did discuss with her    CT Chest data 12/08/21  CLINICAL DATA:  88 year old male with history of increasing shortness of breath. Evaluate for interstitial lung disease.   EXAM: CT CHEST WITHOUT CONTRAST    TECHNIQUE: Multidetector CT imaging of the chest was performed following the standard protocol without intravenous contrast. High resolution imaging of the lungs, as well as inspiratory and expiratory imaging, was performed.   COMPARISON:  Chest CT 09/18/2020.   FINDINGS: Cardiovascular: Heart size is normal. There is no significant pericardial fluid, thickening or pericardial calcification. Aortic atherosclerosis. No definite coronary artery calcifications.   Mediastinum/Nodes: No pathologically enlarged mediastinal or hilar lymph nodes. Please note that accurate exclusion of hilar adenopathy is limited on noncontrast CT scans. Severe circumferential thickening of the distal third of the esophagus immediately before the esophageal hiatus, mass-like in appearance, measuring approximally 3.8 x 3.5 cm.   Lungs/Pleura: There continues to be some patchy areas of ground-glass attenuation, septal thickening, mild cylindrical bronchiectasis, peripheral bronchiolectasis, thickening of the peribronchovascular interstitium and peribronchovascular micro and macro nodularity scattered throughout the lungs bilaterally, with a definitive craniocaudal gradient. Overall, findings appear minimally improved compared to the prior study. No frank honeycombing. Inspiratory and expiratory imaging is unremarkable. No confluent consolidative airspace disease. No pleural effusions.   Upper Abdomen: Low-attenuation lesion in the upper pole of the right kidney measuring 4.6 x 3.7 cm, incompletely characterized on today's non-contrast CT examination, but similar to the prior study and statistically likely to represent a cyst. Aortic atherosclerosis.   Musculoskeletal: There are no aggressive appearing lytic or blastic lesions noted in the visualized portions of the skeleton.   IMPRESSION: 1. Slight improvement in the appearance of the lungs compared to the prior examination, with an overall spectrum of  findings favored to reflect a chronic indolent atypical infectious process such as MAI (mycobacterium avium intracellulare) rather than interstitial lung disease. 2. Aortic atherosclerosis. 3. Mass-like appearance of the distal third of the esophagus immediately before the esophageal hiatus. This is poorly evaluated on today's noncontrast CT examination. A moderate-sized hiatal hernia was noted in this region on the prior examination, and this may simply represent a decompressed hiatal hernia, however, the possibility of gastric and/or distal esophageal neoplasm is not excluded, and further evaluation with nonemergent endoscopy should be considered if clinically appropriate.   Aortic Atherosclerosis (ICD10-I70.0).     Electronically Signed   By: Vinnie Langton M.D.   On: 01/03/2021 11:04  No results found.   Latest Reference Range & Units 11/22/20 14:28  Anti Nuclear Antibody (ANA) NEGATIVE  NEGATIVE  Cyclic Citrullin Peptide Ab UNITS <16  ds DNA Ab IU/mL <1  GBM Ab <1.0 AI <1.0  Myeloperoxidase Abs AI <1.0  Serine Protease 3 AI <1.0  RA Latex Turbid. <14 IU/mL <14    Latest Reference Range & Units 11/22/20 14:28  SSA (Ro) (ENA) Antibody, IgG <1.0 NEG AI <1.0 NEG  SSB (La) (ENA) Antibody, IgG <1.0 NEG AI <1.0 NEG  Scleroderma (Scl-70) (ENA)  Antibody, IgG <1.0 NEG AI <1.0 NEG    Latest Reference Range & Units 11/22/20 14:28  Mitogen-NIL IU/mL >10.00   PFT  No flowsheet data found.  Latest Reference Range & Units 11/22/20 14:28  Vitamin D 1, 25 (OH) Total 18 - 72 pg/mL 37  Vitamin D2 1, 25 (OH) pg/mL <8  Vitamin D3 1, 25 (OH) pg/mL 37      has a past medical history of Actinic keratoses, Allergic rhinitis, Atypical nevi, Diverticulosis, Enlarged prostate, Glaucoma, Glaucoma, H/O degenerative disc disease, adenomatous colonic polyps, Malignant melanoma of skin of abdomen ([place]) (2016), Rotator cuff tendinitis, and Thrombocytopenia ([place]).   reports that he has never  smoked. He has never used smokeless tobacco.  Past Surgical History:  Procedure Laterality Date   HERNIA REPAIR     Left inguinal herniorrhaphy     Melanoma on abdomen  2016    No Known Allergies  Immunization History  Administered Date(s) Administered   Influenza, High Dose Seasonal PF 11/06/2014   Influenza, Quadrivalent, Recombinant, Inj, Pf 11/24/2016   Influenza-Unspecified 11/25/2012, 11/06/2017   PFIZER(Purple Top)SARS-COV-2 Vaccination 02/14/2019, 03/11/2019, 09/13/2019   Pneumococcal Conjugate-13 12/08/2013   Pneumococcal Polysaccharide-23 08/24/1997, 02/01/2019   Tdap 11/25/2012   Zoster Recombinat (Shingrix) 02/02/2018, 05/18/2018   Zoster, Live 02/27/2012    Family History  Problem Relation Age of Onset   Dementia Mother    Breast cancer Sister      Current Outpatient Medications:    b complex vitamins capsule, Take 1 capsule by mouth daily., Disp: , Rfl:    Cholecalciferol (VITAMIN D3) 50 MCG (2000 UT) TABS, Take 1 tablet by mouth daily at 2 PM., Disp: , Rfl:    dorzolamide-timolol (COSOPT) 22.3-6.8 MG/ML ophthalmic solution, Place 1 drop into both eyes 2 (two) times daily., Disp: , Rfl:    latanoprost (XALATAN) 0.005 % ophthalmic solution, 1 drop into BOTH  eye in the evening, Disp: , Rfl:    Multiple Vitamin (MULTIVITAMINS PO), , Disp: , Rfl:    vitamin B-12 (CYANOCOBALAMIN) 250 MCG tablet, Take 250 mcg by mouth daily., Disp: , Rfl:       Objective:   Vitals:   02/05/21 1119  BP: 122/68  Pulse: 62  Temp: 97.7 F (36.5 C)  TempSrc: Oral  SpO2: 98%  Weight: 132 lb 12.8 oz (60.2 kg)  Height: 5\' 9"  (1.753 m)    Estimated body mass index is 19.61 kg/m as calculated from the following:   Height as of this encounter: 5\' 9"  (1.753 m).   Weight as of this encounter: 132 lb 12.8 oz (60.2 kg).  @WEIGHTCHANGE @  Filed Weights   02/05/21 1119  Weight: 132 lb 12.8 oz (60.2 kg)     Physical ExamGeneral: No distress. Looks well. Thin Neuro: Alert and  Oriented x 3. GCS 15. Speech normal Psych: Pleasant Resp:  Barrel Chest - no.  Wheeze - no, Crackles - no, No overt respiratory distress CVS: Normal heart sounds. Murmurs - no Ext: Stigmata of Connective Tissue Disease - no HEENT: Normal upper airway. PEERL +. No post nasal drip        Assessment:       ICD-10-CM   1. Pulmonary infiltrate present on computed tomography  R91.8     2. History of hypercalcemia  Z86.39     3. High serum vitamin D  R79.89     4. Dysphagia, unspecified type  R13.10 Ambulatory referral to Gastroenterology    5. Hiatal hernia  K44.9 Ambulatory referral to  Gastroenterology    6. Mass of esophagus  K22.89 Ambulatory referral to Gastroenterology         Plan:     Patient Instructions  Pulmonary infiltrate present on computed tomography  -At this does not look like classic interstitial lung disease on CT chest 01/03/2021 - The CT chest suggest some low-grade asymptomatic infection plus minus aspiration over time -Blood work for tuberculosis and autoimmune diseases normal  Plan  - No specific intervention -Expectant follow-up in 6 months  History of hypercalcemia High serum vitamin D  -Recent lab check is normal -Dr. Sullivan Lone in hematology is managing  Plan - Per hematologist  Dysphagia, unspecified type x1-2 years for rice and broccoli Hiatal hernia -history Mass of esophagus -possibly seen on CT chest December 2022  -Glad you have gained weight but radiologist is noticing possible mass in an area where there was previous hiatal hernia.  Noticed that you have difficulty swallowing for rice and broccoli x1-2 years.  Plan  - Refer [facility] doctor Therisa Doyne    Followup  - 6 months or sooner if needed    SIGNATURE    Dr. Brand Males, M.D., F.C.C.P,  Pulmonary and Critical Care Medicine Staff Physician, [place] Director - Interstitial Lung Disease  Program  [facility] [place] [facility] [place], [place], [address]  Pager: [phone], If no answer or between  15:00h - 7:00h: call [phone] Telephone: [phone]  2:23 PM 02/05/2021

## 2021-02-05 NOTE — Patient Instructions (Addendum)
Pulmonary infiltrate present on computed tomography  -At this does not look like classic interstitial lung disease on CT chest 01/03/2021 - The CT chest suggest some low-grade asymptomatic infection plus minus aspiration over time -Blood work for tuberculosis and autoimmune diseases normal  Plan  - No specific intervention -Expectant follow-up in 6 months - can consider bronch in future if needd  History of hypercalcemia High serum vitamin D  -Recent lab check is normal -Dr. Sullivan Lone in hematology is managing  Plan - Per hematologist  Dysphagia, unspecified type x1-2 years for rice and broccoli Hiatal hernia -history Mass of esophagus -possibly seen on CT chest December 2022  -Glad you have gained weight but radiologist is noticing possible mass in an area where there was previous hiatal hernia.  Noticed that you have difficulty swallowing for rice and broccoli x1-2 years.  Plan  - Refer [facility] doctor Therisa Doyne    Followup  - 6 months or sooner if needed

## 2021-02-08 DIAGNOSIS — Z7982 Long term (current) use of aspirin: Secondary | ICD-10-CM | POA: Diagnosis not present

## 2021-02-08 DIAGNOSIS — D696 Thrombocytopenia, unspecified: Secondary | ICD-10-CM | POA: Diagnosis not present

## 2021-02-12 DIAGNOSIS — C9110 Chronic lymphocytic leukemia of B-cell type not having achieved remission: Secondary | ICD-10-CM | POA: Diagnosis not present

## 2021-02-12 DIAGNOSIS — D696 Thrombocytopenia, unspecified: Secondary | ICD-10-CM | POA: Diagnosis not present

## 2021-02-12 DIAGNOSIS — Z0000 Encounter for general adult medical examination without abnormal findings: Secondary | ICD-10-CM | POA: Diagnosis not present

## 2021-02-12 DIAGNOSIS — R161 Splenomegaly, not elsewhere classified: Secondary | ICD-10-CM | POA: Diagnosis not present

## 2021-02-22 DIAGNOSIS — Q399 Congenital malformation of esophagus, unspecified: Secondary | ICD-10-CM | POA: Diagnosis not present

## 2021-02-22 DIAGNOSIS — K449 Diaphragmatic hernia without obstruction or gangrene: Secondary | ICD-10-CM | POA: Diagnosis not present

## 2021-02-22 DIAGNOSIS — K2930 Chronic superficial gastritis without bleeding: Secondary | ICD-10-CM | POA: Diagnosis not present

## 2021-02-22 DIAGNOSIS — K219 Gastro-esophageal reflux disease without esophagitis: Secondary | ICD-10-CM | POA: Diagnosis not present

## 2021-02-22 DIAGNOSIS — R1310 Dysphagia, unspecified: Secondary | ICD-10-CM | POA: Diagnosis not present

## 2021-02-22 DIAGNOSIS — R933 Abnormal findings on diagnostic imaging of other parts of digestive tract: Secondary | ICD-10-CM | POA: Diagnosis not present

## 2021-02-22 DIAGNOSIS — K3189 Other diseases of stomach and duodenum: Secondary | ICD-10-CM | POA: Diagnosis not present

## 2021-02-22 DIAGNOSIS — K222 Esophageal obstruction: Secondary | ICD-10-CM | POA: Diagnosis not present

## 2021-02-27 DIAGNOSIS — K219 Gastro-esophageal reflux disease without esophagitis: Secondary | ICD-10-CM | POA: Diagnosis not present

## 2021-02-27 DIAGNOSIS — K2930 Chronic superficial gastritis without bleeding: Secondary | ICD-10-CM | POA: Diagnosis not present

## 2021-03-28 DIAGNOSIS — M720 Palmar fascial fibromatosis [Dupuytren]: Secondary | ICD-10-CM | POA: Diagnosis not present

## 2021-03-28 DIAGNOSIS — L814 Other melanin hyperpigmentation: Secondary | ICD-10-CM | POA: Diagnosis not present

## 2021-03-28 DIAGNOSIS — D692 Other nonthrombocytopenic purpura: Secondary | ICD-10-CM | POA: Diagnosis not present

## 2021-03-28 DIAGNOSIS — Z85828 Personal history of other malignant neoplasm of skin: Secondary | ICD-10-CM | POA: Diagnosis not present

## 2021-04-09 DIAGNOSIS — H02102 Unspecified ectropion of right lower eyelid: Secondary | ICD-10-CM | POA: Diagnosis not present

## 2021-04-09 DIAGNOSIS — H35373 Puckering of macula, bilateral: Secondary | ICD-10-CM | POA: Diagnosis not present

## 2021-04-09 DIAGNOSIS — H26491 Other secondary cataract, right eye: Secondary | ICD-10-CM | POA: Diagnosis not present

## 2021-04-09 DIAGNOSIS — H401133 Primary open-angle glaucoma, bilateral, severe stage: Secondary | ICD-10-CM | POA: Diagnosis not present

## 2021-04-29 ENCOUNTER — Other Ambulatory Visit: Payer: Self-pay

## 2021-04-29 DIAGNOSIS — C9110 Chronic lymphocytic leukemia of B-cell type not having achieved remission: Secondary | ICD-10-CM

## 2021-04-30 ENCOUNTER — Other Ambulatory Visit: Payer: Self-pay

## 2021-04-30 ENCOUNTER — Inpatient Hospital Stay: Payer: Medicare Other

## 2021-04-30 ENCOUNTER — Inpatient Hospital Stay: Payer: Medicare Other | Attending: Hematology | Admitting: Hematology

## 2021-04-30 VITALS — BP 106/62 | HR 72 | Temp 97.7°F | Resp 18 | Wt 140.9 lb

## 2021-04-30 DIAGNOSIS — C9110 Chronic lymphocytic leukemia of B-cell type not having achieved remission: Secondary | ICD-10-CM | POA: Insufficient documentation

## 2021-04-30 DIAGNOSIS — D539 Nutritional anemia, unspecified: Secondary | ICD-10-CM | POA: Insufficient documentation

## 2021-04-30 DIAGNOSIS — D696 Thrombocytopenia, unspecified: Secondary | ICD-10-CM | POA: Diagnosis not present

## 2021-04-30 LAB — RETIC PANEL
Immature Retic Fract: 8.8 % (ref 2.3–15.9)
RBC.: 3.35 MIL/uL — ABNORMAL LOW (ref 4.22–5.81)
Retic Count, Absolute: 49.9 10*3/uL (ref 19.0–186.0)
Retic Ct Pct: 1.5 % (ref 0.4–3.1)
Reticulocyte Hemoglobin: 34.6 pg (ref >27.9)

## 2021-04-30 LAB — CBC WITH DIFFERENTIAL (CANCER CENTER ONLY)
Abs Immature Granulocytes: 0.03 10*3/uL (ref 0.00–0.07)
Basophils Absolute: 0 10*3/uL (ref 0.0–0.1)
Basophils Relative: 0 %
Eosinophils Absolute: 0.3 10*3/uL (ref 0.0–0.5)
Eosinophils Relative: 1 %
HCT: 32.5 % — ABNORMAL LOW (ref 39.0–52.0)
Hemoglobin: 10.9 g/dL — ABNORMAL LOW (ref 13.0–17.0)
Immature Granulocytes: 0 %
Lymphocytes Relative: 78 %
Lymphs Abs: 18.6 10*3/uL — ABNORMAL HIGH (ref 0.7–4.0)
MCH: 32.8 pg (ref 26.0–34.0)
MCHC: 33.5 g/dL (ref 30.0–36.0)
MCV: 97.9 fL (ref 80.0–100.0)
Monocytes Absolute: 2.1 10*3/uL — ABNORMAL HIGH (ref 0.1–1.0)
Monocytes Relative: 9 %
Neutro Abs: 2.7 10*3/uL (ref 1.7–7.7)
Neutrophils Relative %: 12 %
Platelet Count: 102 10*3/uL — ABNORMAL LOW (ref 150–400)
RBC: 3.32 MIL/uL — ABNORMAL LOW (ref 4.22–5.81)
RDW: 14 % (ref 11.5–15.5)
WBC Count: 23.8 10*3/uL — ABNORMAL HIGH (ref 4.0–10.5)
nRBC: 0 % (ref 0.0–0.2)

## 2021-04-30 LAB — CMP (CANCER CENTER ONLY)
ALT: 11 U/L (ref 0–44)
AST: 13 U/L — ABNORMAL LOW (ref 15–41)
Albumin: 4 g/dL (ref 3.5–5.0)
Alkaline Phosphatase: 58 U/L (ref 38–126)
Anion gap: 3 — ABNORMAL LOW (ref 5–15)
BUN: 22 mg/dL (ref 8–23)
CO2: 31 mmol/L (ref 22–32)
Calcium: 9.3 mg/dL (ref 8.9–10.3)
Chloride: 108 mmol/L (ref 98–111)
Creatinine: 1.11 mg/dL (ref 0.61–1.24)
GFR, Estimated: >60 mL/min (ref >60)
Glucose, Bld: 116 mg/dL — ABNORMAL HIGH (ref 70–99)
Potassium: 4.2 mmol/L (ref 3.5–5.1)
Sodium: 142 mmol/L (ref 135–145)
Total Bilirubin: 0.4 mg/dL (ref 0.3–1.2)
Total Protein: 6.8 g/dL (ref 6.5–8.1)

## 2021-04-30 LAB — SAMPLE TO BLOOD BANK

## 2021-04-30 LAB — LACTATE DEHYDROGENASE: LDH: 178 U/L (ref 98–192)

## 2021-04-30 NOTE — Progress Notes (Signed)
. ? ?HEMATOLOGY ONCOLOGY CLINIC NOTE ? ?Date of service: 04/30/2021 ? ? ?Patient Care Team: ?Brandon Pretty, MD as PCP - General (Internal Medicine) ? ?Chief Complaint:  ?Follow-up for continued evaluation and management of CLL ? ?Diagnosis: CLL ? ?Current Treatment: Monitoring ? ?INTERVAL HISTORY: ? ?Brandon Norris is a 88 y.o. male here for continued evaluation and management of his CLL. He reports He is doing well with no new symptoms or concerns. ? ?He reports some weakness in thighs when walking and he notes no issues when standing. He does not some weakness when standing for prolonged periods of time. ? ?He reports he is not on any diuretics. ? ?He reports he is eating and drinking better. ? ?He reports that he is emotionally well. He notes his sons come to visit him frequently and he has been going to dinner with friends. ? ?He has no clinical evidence of CLL progression at this time. ? ?No fever, chills, night sweats. ?No new lumps, bumps, or lesions/rashes. ?No abdominal pain or change in bowel habits. ?No new or unexpected weight loss. ?No other new or acute focal symptoms. ? ?Labs done today 04/30/2021 reviewed with him in detail.  ?CBC shows stable hemoglobin of 10.9, WBC count of 23.8k , platelets 102k. ?CMP LDH ferritin currently pending. ?Retic panel stable WNL except RBC count of 3.35. ? ?REVIEW OF SYSTEMS:   ?.10 Point review of Systems was done is negative except as noted above. ? ? ? ?Past Medical History:  ?Diagnosis Date  ? Actinic keratoses   ? Allergic rhinitis   ? Atypical nevi   ? Diverticulosis   ? Enlarged prostate   ? Glaucoma   ? Glaucoma   ? H/O degenerative disc disease   ? Hx of adenomatous colonic polyps   ? Malignant melanoma of skin of abdomen ([place]) 2016  ? Rotator cuff tendinitis   ? Thrombocytopenia ([place])   ? ? ?. ?Past Surgical History:  ?Procedure Laterality Date  ? HERNIA REPAIR    ? Left inguinal herniorrhaphy    ? Melanoma on abdomen  2016  ? ? ? ?Social History   ? ?Tobacco Use  ? Smoking status: Never  ? Smokeless tobacco: Never  ?Vaping Use  ? Vaping Use: Never used  ?Substance Use Topics  ? Alcohol use: Yes  ?  Alcohol/week: 3.0 standard drinks  ?  Types: 3 Glasses of wine per week  ? Drug use: No  ? ? ?ALLERGIES:  has No Known Allergies. ? ?MEDICATIONS:  ?Current Outpatient Medications  ?Medication Sig Dispense Refill  ? b complex vitamins capsule Take 1 capsule by mouth daily.    ? Cholecalciferol (VITAMIN D3) 50 MCG (2000 UT) TABS Take 1 tablet by mouth daily at 2 PM.    ? dorzolamide-timolol (COSOPT) 22.3-6.8 MG/ML ophthalmic solution Place 1 drop into both eyes 2 (two) times daily.    ? latanoprost (XALATAN) 0.005 % ophthalmic solution 1 drop into BOTH  eye in the evening    ? Multiple Vitamin (MULTIVITAMINS PO)     ? vitamin B-12 (CYANOCOBALAMIN) 250 MCG tablet Take 250 mcg by mouth daily.    ? ?No current facility-administered medications for this visit.  ? ? ?PHYSICAL EXAMINATION: ?.BP 106/62   Pulse 72   Temp 97.7 ?F (36.5 ?C)   Resp 18   Wt 140 lb 14.4 oz (63.9 kg)   SpO2 100%   BMI 20.81 kg/m?  ?NAD ?GENERAL:alert, in no acute distress and comfortable ?SKIN: no acute  rashes, no significant lesions ?EYES: conjunctiva are pink and non-injected, sclera anicteric ?NECK: supple, no JVD ?LYMPH:  no palpable lymphadenopathy in the cervical, axillary or inguinal regions ?LUNGS: clear to auscultation b/l with normal respiratory effort ?HEART: regular rate & rhythm ?ABDOMEN:  normoactive bowel sounds , non tender, not distended. ?Extremity: no pedal edema ?PSYCH: alert & oriented x 3 with fluent speech ?NEURO: no focal motor/sensory deficits ? ?LABORATORY DATA:  ? ?I have reviewed the data as listed ? ?. ? ?  Latest Ref Rng & Units 04/30/2021  ?  2:48 PM 01/28/2021  ?  9:17 AM 11/20/2020  ?  1:41 PM  ?CBC  ?WBC 4.0 - 10.5 K/uL 23.8   20.7   21.8    ?Hemoglobin 13.0 - 17.0 g/dL 10.9   11.0   11.2    ?Hematocrit 39.0 - 52.0 % 32.5   35.0   34.9    ?Platelets 150 - 400  K/uL 102   103   116    ? ? ?. ? ?  Latest Ref Rng & Units 01/28/2021  ?  9:17 AM 11/22/2020  ?  2:28 PM 11/20/2020  ?  1:41 PM  ?CMP  ?Glucose 70 - 99 mg/dL 77   117   147    ?BUN 8 - 23 mg/dL '25   28   23    '$ ?Creatinine 0.61 - 1.24 mg/dL 1.03   1.16   1.10    ?Sodium 135 - 145 mmol/L 142   141   140    ?Potassium 3.5 - 5.1 mmol/L 4.4   4.1   4.0    ?Chloride 98 - 111 mmol/L 108   104   104    ?CO2 22 - 32 mmol/L 29   32   29    ?Calcium 8.9 - 10.3 mg/dL 8.8   9.4   9.0    ?Total Protein 6.5 - 8.1 g/dL 7.0    6.5    ?Total Bilirubin 0.3 - 1.2 mg/dL 0.4    0.3    ?Alkaline Phos 38 - 126 U/L 45    48    ?AST 15 - 41 U/L 18    15    ?ALT 0 - 44 U/L 18    15    ? ? ?[place] ABD (01/28/2016) ? ?ABDOMEN ULTRASOUND COMPLETE ?  ?COMPARISON:  None. ?  ?FINDINGS: ?Gallbladder: No gallstones or wall thickening visualized. There is ?no pericholecystic fluid. No sonographic Murphy sign noted by ?sonographer. ?  ?Common bile duct: Diameter: 2 mm. There is no intrahepatic, common ?hepatic, or common bile duct dilatation. ?  ?Liver: No focal lesion identified. Within normal limits in ?parenchymal echogenicity. ?  ?IVC: No abnormality visualized. ?  ?Pancreas: Visualized portion unremarkable. Portions of pancreas ?obscured by gas. ?  ?Spleen: Spleen measures 16.0 x 14.2 x 5.8 cm with a measures splenic ?volume of 621 cubic cm. No focal splenic lesions are evident. ?  ?Right Kidney: Length: 11.4 cm. Echogenicity within normal limits. No ?hydronephrosis visualized. There is a parapelvic cyst in the left ?kidney measuring 5.1 x 3.5 x 6.3 cm. ?  ?Left Kidney: Length: 11.1 cm. Echogenicity within normal limits. No ?mass or hydronephrosis visualized. ?  ?Abdominal aorta: No aneurysm visualized. ?  ?Other findings: No demonstrable ascites. ?  ?IMPRESSION: ?Splenomegaly.  No focal splenic lesions evident. ?  ?Parapelvic cyst right kidney measuring 5.1 x 3.5 x 6.3 cm. ?  ?Portions of pancreas obscured by gas. Visualized portions of ?  pancreas  appear normal. ?  ?Study otherwise unremarkable. ?  ?  ?Electronically Signed ?  By: Lowella Grip III M.D. ?  On: 01/28/2016 14:09 ? ?FISH, CLL Prognostic on 08/26/16 ? ? ?RADIOGRAPHIC STUDIES: ?I have personally reviewed the radiological images as listed and agreed with the findings in the report. ?No results found. ? ?ASSESSMENT & PLAN:  ? ?88 y.o. male with ? ?1) Rai Stage 2 Chronic lymphocytic leukemia. (with splenomegaly) ?Trisomy 12 and a 13q deletion.  ?Would be stage II if splenomegaly was considered to be related to the CLL. Patient however notes that he has had splenomegaly for decades without clear etiology noted previously. ? ?WBC counts are not changed significantly. ?No anemia ?Mild thrombocytopenia that is not significantly changed platelets around 101k.  ?No constitutional symptoms. ?No issues with infections/bleeding. ?No clinically palpable lymphadenopathy ( US showed enlarged spleen 16x14.2x5.8 cms) ? ?2) status post anorexia with weight loss.  Patient is now eating much better and has gained 7 pounds in the last 2 to 3 months.  Resolved with steroids ? ?3) previous hypercalcemia and elevated creatinine which had resolved.  Resolved with steroids ? ?4) macrocytic anemia B12 borderline low at 281 up from 233. ?Has been taking B complex 1 capsule daily ?Would recommend taking additional B12 1000 mcg daily over-the-counter. ? ? ?5) Incidentally noted left parapelvic right kidney cyst measuring 5.1 x 3.5 x 6.3 cm in size. ?Follow-up with PCP with continued management  ?Ct abd- 9/13-- simple rt renal cyst noted. No concerning renal lesions. ? ? ?6) . ?Patient Active Problem List  ? Diagnosis Date Noted  ? Hypercalcemia 09/13/2020  ? CLL (chronic lymphocytic leukemia) ([place]) 01/21/2016  ? Elevated WBC count 01/16/2016  ? Glaucoma 01/16/2016  ? Thrombocytopenia ([place]) 01/16/2016  ? Diverticulosis 01/16/2016  ? Encounter for colonoscopy due to history of adenomatous colonic polyps 01/16/2016  ? Allergic  rhinitis 01/16/2016  ?-f/u with PCP for mx ? ?PLAN: ?-Labs done today 04/30/2021 reviewed with him in detail.  ?CBC shows stable hemoglobin of 10.9, WBC count of 23.8k , platelets 102k. ?CMP stable ? LDH wnl at 178 ?

## 2021-05-01 ENCOUNTER — Telehealth: Payer: Self-pay | Admitting: Hematology

## 2021-05-01 LAB — FERRITIN: Ferritin: 53 ng/mL (ref 24–336)

## 2021-05-01 NOTE — Telephone Encounter (Signed)
Scheduled follow-up appointment per 4/25 los. Patient is aware. ?

## 2021-05-02 ENCOUNTER — Encounter: Payer: Self-pay | Admitting: Hematology

## 2021-08-20 DIAGNOSIS — H26491 Other secondary cataract, right eye: Secondary | ICD-10-CM | POA: Diagnosis not present

## 2021-08-20 DIAGNOSIS — H401133 Primary open-angle glaucoma, bilateral, severe stage: Secondary | ICD-10-CM | POA: Diagnosis not present

## 2021-09-02 ENCOUNTER — Other Ambulatory Visit: Payer: Self-pay

## 2021-09-02 DIAGNOSIS — C9110 Chronic lymphocytic leukemia of B-cell type not having achieved remission: Secondary | ICD-10-CM

## 2021-09-03 ENCOUNTER — Inpatient Hospital Stay: Payer: Medicare Other

## 2021-09-03 ENCOUNTER — Other Ambulatory Visit: Payer: Self-pay

## 2021-09-03 ENCOUNTER — Inpatient Hospital Stay: Payer: Medicare Other | Attending: Hematology | Admitting: Hematology

## 2021-09-03 VITALS — BP 137/57 | HR 74 | Temp 97.7°F | Resp 15 | Wt 140.2 lb

## 2021-09-03 DIAGNOSIS — D696 Thrombocytopenia, unspecified: Secondary | ICD-10-CM

## 2021-09-03 DIAGNOSIS — R229 Localized swelling, mass and lump, unspecified: Secondary | ICD-10-CM | POA: Diagnosis not present

## 2021-09-03 DIAGNOSIS — C9110 Chronic lymphocytic leukemia of B-cell type not having achieved remission: Secondary | ICD-10-CM | POA: Insufficient documentation

## 2021-09-03 LAB — CBC WITH DIFFERENTIAL (CANCER CENTER ONLY)
Abs Immature Granulocytes: 0.03 10*3/uL (ref 0.00–0.07)
Basophils Absolute: 0 10*3/uL (ref 0.0–0.1)
Basophils Relative: 0 %
Eosinophils Absolute: 0.4 10*3/uL (ref 0.0–0.5)
Eosinophils Relative: 2 %
HCT: 32.9 % — ABNORMAL LOW (ref 39.0–52.0)
Hemoglobin: 10.9 g/dL — ABNORMAL LOW (ref 13.0–17.0)
Immature Granulocytes: 0 %
Lymphocytes Relative: 78 %
Lymphs Abs: 17.4 10*3/uL — ABNORMAL HIGH (ref 0.7–4.0)
MCH: 32.3 pg (ref 26.0–34.0)
MCHC: 33.1 g/dL (ref 30.0–36.0)
MCV: 97.6 fL (ref 80.0–100.0)
Monocytes Absolute: 2.3 10*3/uL — ABNORMAL HIGH (ref 0.1–1.0)
Monocytes Relative: 10 %
Neutro Abs: 2.1 10*3/uL (ref 1.7–7.7)
Neutrophils Relative %: 10 %
Platelet Count: 130 10*3/uL — ABNORMAL LOW (ref 150–400)
RBC: 3.37 MIL/uL — ABNORMAL LOW (ref 4.22–5.81)
RDW: 14.8 % (ref 11.5–15.5)
Smear Review: NORMAL
WBC Count: 22.3 10*3/uL — ABNORMAL HIGH (ref 4.0–10.5)
nRBC: 0 % (ref 0.0–0.2)

## 2021-09-03 LAB — CMP (CANCER CENTER ONLY)
ALT: 14 U/L (ref 0–44)
AST: 16 U/L (ref 15–41)
Albumin: 4.1 g/dL (ref 3.5–5.0)
Alkaline Phosphatase: 65 U/L (ref 38–126)
Anion gap: 2 — ABNORMAL LOW (ref 5–15)
BUN: 25 mg/dL — ABNORMAL HIGH (ref 8–23)
CO2: 33 mmol/L — ABNORMAL HIGH (ref 22–32)
Calcium: 9.6 mg/dL (ref 8.9–10.3)
Chloride: 106 mmol/L (ref 98–111)
Creatinine: 1.24 mg/dL (ref 0.61–1.24)
GFR, Estimated: 56 mL/min — ABNORMAL LOW (ref >60)
Glucose, Bld: 106 mg/dL — ABNORMAL HIGH (ref 70–99)
Potassium: 4.1 mmol/L (ref 3.5–5.1)
Sodium: 141 mmol/L (ref 135–145)
Total Bilirubin: 0.4 mg/dL (ref 0.3–1.2)
Total Protein: 6.7 g/dL (ref 6.5–8.1)

## 2021-09-03 LAB — RETIC PANEL
Immature Retic Fract: 7.5 % (ref 2.3–15.9)
RBC.: 3.3 MIL/uL — ABNORMAL LOW (ref 4.22–5.81)
Retic Count, Absolute: 71.6 10*3/uL (ref 19.0–186.0)
Retic Ct Pct: 2.2 % (ref 0.4–3.1)
Reticulocyte Hemoglobin: 34.7 pg (ref >27.9)

## 2021-09-03 LAB — SAMPLE TO BLOOD BANK

## 2021-09-03 LAB — LACTATE DEHYDROGENASE: LDH: 178 U/L (ref 98–192)

## 2021-09-03 LAB — FERRITIN: Ferritin: 81 ng/mL (ref 24–336)

## 2021-09-03 NOTE — Progress Notes (Signed)
[name]  HEMATOLOGY ONCOLOGY CLINIC NOTE  Date of service: 09/03/2021   Patient Care Team: [name], MD as PCP - General (Internal Medicine)  Chief Complaint:  Follow-up for continued evaluation and management of CLL  Diagnosis: CLL  Current Treatment: Monitoring  INTERVAL HISTORY: Brandon Norris is a 88 y.o. male here for continued evaluation and management of his CLL. He reports He is doing well with no new symptoms or concerns.  He notes gaining about 10-15 pounds and has good p.o intake.  He reports continued weakness in thighs when walking and he notes no issues when standing. He does not some weakness when standing for prolonged periods of time.  He reports a small red mass that has a darkened spot on it with no overt bleeding. We discussed a possible referral for [place] surgery for evaluation and to have it removed which he is agreeable to. He notes he has had a history of a few basil cell carcinomas. He further notes the red mass with dark discoloration appeared rather quickly.  He notes he is emotionally well.  He has no clinical evidence of CLL progression at this time.  No fever, chills, night sweats. No new lumps, bumps, or lesions/rashes. No abdominal pain or change in bowel habits. No new leg swelling. No new or unexpected weight loss. No other new or acute focal symptoms.  Labs done today 09/03/2021 reviewed with him in detail.   REVIEW OF SYSTEMS:   .10 Point review of Systems was done is negative except as noted above.    Past Medical History:  Diagnosis Date   Actinic keratoses    Allergic rhinitis    Atypical nevi    Diverticulosis    Enlarged prostate    Glaucoma    Glaucoma    H/O degenerative disc disease    Hx of adenomatous colonic polyps    Malignant melanoma of skin of abdomen ([place]) 2016   Rotator cuff tendinitis    Thrombocytopenia ([place])     . Past Surgical History:  Procedure Laterality Date   HERNIA REPAIR     Left  inguinal herniorrhaphy     Melanoma on abdomen  2016     Social History   Tobacco Use   Smoking status: Never   Smokeless tobacco: Never  Vaping Use   Vaping Use: Never used  Substance Use Topics   Alcohol use: Yes    Alcohol/week: 3.0 standard drinks of alcohol    Types: 3 Glasses of wine per week   Drug use: No    ALLERGIES:  has No Known Allergies.  MEDICATIONS:  Current Outpatient Medications  Medication Sig Dispense Refill   b complex vitamins capsule Take 1 capsule by mouth daily.     Cholecalciferol (VITAMIN D3) 50 MCG (2000 UT) TABS Take 1 tablet by mouth daily at 2 PM.     dorzolamide-timolol (COSOPT) 22.3-6.8 MG/ML ophthalmic solution Place 1 drop into both eyes 2 (two) times daily.     latanoprost (XALATAN) 0.005 % ophthalmic solution 1 drop into BOTH  eye in the evening     Multiple Vitamin (MULTIVITAMINS PO)      vitamin B-12 (CYANOCOBALAMIN) 250 MCG tablet Take 250 mcg by mouth daily.     No current facility-administered medications for this visit.    PHYSICAL EXAMINATION: .BP (!) 137/57 (BP Location: Left Arm, Patient Position: Sitting) Comment: Nurse was notify  Pulse 74   Temp 97.7 F (36.5 C) (Temporal)   Resp 15  Wt 140 lb 3.2 oz (63.6 kg)   SpO2 100%   BMI 20.70 kg/m  NAD GENERAL:alert, in no acute distress and comfortable SKIN: no acute rashes. Ulcerated 3 cm x 3 cm lesion on the medial aspect of upper left leg. EYES: conjunctiva are pink and non-injected, sclera anicteric NECK: supple, no JVD LYMPH:  no palpable lymphadenopathy in the cervical, axillary or inguinal regions LUNGS: clear to auscultation b/l with normal respiratory effort HEART: regular rate & rhythm ABDOMEN:  normoactive bowel sounds , non tender, not distended. Extremity: no pedal edema PSYCH: alert & oriented x 3 with fluent speech NEURO: no focal motor/sensory deficits  LABORATORY DATA:   I have reviewed the data as listed  .    Latest Ref Rng & Units 09/03/2021     1:30 PM 04/30/2021    2:48 PM 01/28/2021    9:17 AM  CBC  WBC 4.0 - 10.5 K/uL 22.3  23.8  20.7   Hemoglobin 13.0 - 17.0 g/dL 10.9  10.9  11.0   Hematocrit 39.0 - 52.0 % 32.9  32.5  35.0   Platelets 150 - 400 K/uL 130  102  103     .    Latest Ref Rng & Units 04/30/2021    2:48 PM 01/28/2021    9:17 AM 11/22/2020    2:28 PM  CMP  Glucose 70 - 99 mg/dL 116  77  117   BUN 8 - 23 mg/dL '22  25  28   '$ Creatinine 0.61 - 1.24 mg/dL 1.11  1.03  1.16   Sodium 135 - 145 mmol/L 142  142  141   Potassium 3.5 - 5.1 mmol/L 4.2  4.4  4.1   Chloride 98 - 111 mmol/L 108  108  104   CO2 22 - 32 mmol/L 31  29  32   Calcium 8.9 - 10.3 mg/dL 9.3  8.8  9.4   Total Protein 6.5 - 8.1 g/dL 6.8  7.0    Total Bilirubin 0.3 - 1.2 mg/dL 0.4  0.4    Alkaline Phos 38 - 126 U/L 58  45    AST 15 - 41 U/L 13  18    ALT 0 - 44 U/L 11  18      [place] ABD (01/28/2016)  ABDOMEN ULTRASOUND COMPLETE   COMPARISON:  None.   FINDINGS: Gallbladder: No gallstones or wall thickening visualized. There is no pericholecystic fluid. No sonographic Murphy sign noted by sonographer.   Common bile duct: Diameter: 2 mm. There is no intrahepatic, common hepatic, or common bile duct dilatation.   Liver: No focal lesion identified. Within normal limits in parenchymal echogenicity.   IVC: No abnormality visualized.   Pancreas: Visualized portion unremarkable. Portions of pancreas obscured by gas.   Spleen: Spleen measures 16.0 x 14.2 x 5.8 cm with a measures splenic volume of 621 cubic cm. No focal splenic lesions are evident.   Right Kidney: Length: 11.4 cm. Echogenicity within normal limits. No hydronephrosis visualized. There is a parapelvic cyst in the left kidney measuring 5.1 x 3.5 x 6.3 cm.   Left Kidney: Length: 11.1 cm. Echogenicity within normal limits. No mass or hydronephrosis visualized.   Abdominal aorta: No aneurysm visualized.   Other findings: No demonstrable ascites.   IMPRESSION: Splenomegaly.   No focal splenic lesions evident.   Parapelvic cyst right kidney measuring 5.1 x 3.5 x 6.3 cm.   Portions of pancreas obscured by gas. Visualized portions of pancreas appear normal.  Study otherwise unremarkable.     Electronically Signed   By: Lowella Grip III M.D.   On: 01/28/2016 14:09  FISH, CLL Prognostic on 08/26/16   RADIOGRAPHIC STUDIES: I have personally reviewed the radiological images as listed and agreed with the findings in the report. No results found.  ASSESSMENT & PLAN:   88 y.o. male with  1) Rai Stage 2 Chronic lymphocytic leukemia. (with splenomegaly) Trisomy 12 and a 13q deletion.  Would be stage II if splenomegaly was considered to be related to the CLL. Patient however notes that he has had splenomegaly for decades without clear etiology noted previously.  WBC counts are not changed significantly. No anemia Mild thrombocytopenia that is not significantly changed platelets around 101k.  No constitutional symptoms. No issues with infections/bleeding. No clinically palpable lymphadenopathy ( US showed enlarged spleen 16x14.2x5.8 cms)  2) status post anorexia with weight loss.  Patient is now eating much better and has gained 7 pounds in the last 2 to 3 months.  Resolved with steroids  3) previous hypercalcemia and elevated creatinine which had resolved.  Resolved with steroids  4) macrocytic anemia B12 borderline low at 281 up from 233. Has been taking B complex 1 capsule daily Would recommend taking additional B12 1000 mcg daily over-the-counter.   5) Incidentally noted left parapelvic right kidney cyst measuring 5.1 x 3.5 x 6.3 cm in size. Follow-up with PCP with continued management  Ct abd- 9/13-- simple rt renal cyst noted. No concerning renal lesions.   6) . Patient Active Problem List   Diagnosis Date Noted   Hypercalcemia 09/13/2020   CLL (chronic lymphocytic leukemia) ([place]) 01/21/2016   Elevated WBC count 01/16/2016   Glaucoma  01/16/2016   Thrombocytopenia ([place]) 01/16/2016   Diverticulosis 01/16/2016   Encounter for colonoscopy due to history of adenomatous colonic polyps 01/16/2016   Allergic rhinitis 01/16/2016  -f/u with PCP for mx  PLAN: -Labs done today 09/03/2021 reviewed with him in detail.  CBC shows stable hemoglobin of 10.9, WBC count of 22.3k , platelets 130k. CMP stable LDH wnl at 178 ferritin .No results found for: "IRON", "TIBC", "IRONPCTSAT" (Iron and TIBC)  Lab Results  Component Value Date   FERRITIN 81 09/03/2021   Retic panel stable WNL except RBC count of 3.30. -Patient has no clinical evidence of CLL progression at this time. -No constitutional symptoms. -No significant palpable lymphadenopathy and hepatosplenomegaly -Currently undergoing pulmonary evaluation to rule out sarcoidosis in the context of elevated 1, 25 hydroxy vitamin D levels and lung findings.   FOLLOW UP: -Referral to [facility] [place] [facility] referral for ulcerated mass on left leg concerning for skin cancer in patient with CLL -RTC with Dr Irene Limbo with labs in 4 months  .The total time spent in the appointment was 23 minutes* .  All of the patient's questions were answered with apparent satisfaction. The patient knows to call the clinic with any problems, questions or concerns.   Sullivan Lone MD MS AAHIVMS [facility] Hematology/Oncology Physician [facility]  .*Total Encounter Time as defined by the Centers for Medicare and Medicaid Services includes, in addition to the face-to-face time of a patient visit (documented in the note above) non-face-to-face time: obtaining and reviewing outside history, ordering and reviewing medications, tests or procedures, care coordination (communications with other health care professionals or caregivers) and documentation in the medical record.  I, Melene Muller, am acting as scribe for Dr. Sullivan Lone, MD.  .I have reviewed the above documentation for accuracy  and  completeness, and I agree with the above.Brunetta Genera MD

## 2021-09-04 ENCOUNTER — Telehealth: Payer: Self-pay | Admitting: Hematology

## 2021-09-04 NOTE — Telephone Encounter (Signed)
Scheduled follow-up appointment per 8/29 los. Patient is aware.

## 2021-09-09 ENCOUNTER — Encounter: Payer: Self-pay | Admitting: Hematology

## 2021-09-12 NOTE — Progress Notes (Signed)
Referral faxed to [facility] to assess mass on Left leg.

## 2021-09-30 ENCOUNTER — Other Ambulatory Visit: Payer: Self-pay | Admitting: General Surgery

## 2021-09-30 DIAGNOSIS — L98499 Non-pressure chronic ulcer of skin of other sites with unspecified severity: Secondary | ICD-10-CM | POA: Diagnosis not present

## 2021-09-30 DIAGNOSIS — L989 Disorder of the skin and subcutaneous tissue, unspecified: Secondary | ICD-10-CM | POA: Diagnosis not present

## 2021-09-30 DIAGNOSIS — D485 Neoplasm of uncertain behavior of skin: Secondary | ICD-10-CM | POA: Diagnosis not present

## 2021-12-03 ENCOUNTER — Telehealth: Payer: Self-pay | Admitting: Hematology

## 2021-12-03 NOTE — Telephone Encounter (Signed)
Called patient to r/s dec. Patient notified of new appointment.

## 2021-12-24 ENCOUNTER — Other Ambulatory Visit: Payer: Medicare Other

## 2021-12-24 ENCOUNTER — Ambulatory Visit: Payer: Medicare Other | Admitting: Hematology

## 2022-01-09 ENCOUNTER — Other Ambulatory Visit: Payer: Self-pay

## 2022-01-09 DIAGNOSIS — C9110 Chronic lymphocytic leukemia of B-cell type not having achieved remission: Secondary | ICD-10-CM

## 2022-01-10 ENCOUNTER — Inpatient Hospital Stay: Payer: Medicare Other | Attending: Hematology

## 2022-01-10 ENCOUNTER — Inpatient Hospital Stay (HOSPITAL_BASED_OUTPATIENT_CLINIC_OR_DEPARTMENT_OTHER): Payer: Medicare Other | Admitting: Hematology

## 2022-01-10 VITALS — BP 126/57 | HR 70 | Temp 97.9°F | Resp 17 | Wt 149.4 lb

## 2022-01-10 DIAGNOSIS — C9110 Chronic lymphocytic leukemia of B-cell type not having achieved remission: Secondary | ICD-10-CM | POA: Diagnosis not present

## 2022-01-10 LAB — CBC WITH DIFFERENTIAL (CANCER CENTER ONLY)
Abs Immature Granulocytes: 0.03 10*3/uL (ref 0.00–0.07)
Basophils Absolute: 0 10*3/uL (ref 0.0–0.1)
Basophils Relative: 0 %
Eosinophils Absolute: 0.3 10*3/uL (ref 0.0–0.5)
Eosinophils Relative: 1 %
HCT: 33.6 % — ABNORMAL LOW (ref 39.0–52.0)
Hemoglobin: 11 g/dL — ABNORMAL LOW (ref 13.0–17.0)
Immature Granulocytes: 0 %
Lymphocytes Relative: 82 %
Lymphs Abs: 18.5 10*3/uL — ABNORMAL HIGH (ref 0.7–4.0)
MCH: 33.5 pg (ref 26.0–34.0)
MCHC: 32.7 g/dL (ref 30.0–36.0)
MCV: 102.4 fL — ABNORMAL HIGH (ref 80.0–100.0)
Monocytes Absolute: 1.4 10*3/uL — ABNORMAL HIGH (ref 0.1–1.0)
Monocytes Relative: 6 %
Neutro Abs: 2.6 10*3/uL (ref 1.7–7.7)
Neutrophils Relative %: 11 %
Platelet Count: 121 10*3/uL — ABNORMAL LOW (ref 150–400)
RBC: 3.28 MIL/uL — ABNORMAL LOW (ref 4.22–5.81)
RDW: 15 % (ref 11.5–15.5)
WBC Count: 22.8 10*3/uL — ABNORMAL HIGH (ref 4.0–10.5)
nRBC: 0 % (ref 0.0–0.2)

## 2022-01-10 LAB — FERRITIN: Ferritin: 79 ng/mL (ref 24–336)

## 2022-01-10 LAB — CMP (CANCER CENTER ONLY)
ALT: 15 U/L (ref 0–44)
AST: 17 U/L (ref 15–41)
Albumin: 4.3 g/dL (ref 3.5–5.0)
Alkaline Phosphatase: 72 U/L (ref 38–126)
Anion gap: 3 — ABNORMAL LOW (ref 5–15)
BUN: 21 mg/dL (ref 8–23)
CO2: 32 mmol/L (ref 22–32)
Calcium: 9.6 mg/dL (ref 8.9–10.3)
Chloride: 108 mmol/L (ref 98–111)
Creatinine: 1.27 mg/dL — ABNORMAL HIGH (ref 0.61–1.24)
GFR, Estimated: 54 mL/min — ABNORMAL LOW (ref >60)
Glucose, Bld: 116 mg/dL — ABNORMAL HIGH (ref 70–99)
Potassium: 4.5 mmol/L (ref 3.5–5.1)
Sodium: 143 mmol/L (ref 135–145)
Total Bilirubin: 0.5 mg/dL (ref 0.3–1.2)
Total Protein: 6.7 g/dL (ref 6.5–8.1)

## 2022-01-10 LAB — RETIC PANEL
Immature Retic Fract: 7.3 % (ref 2.3–15.9)
RBC.: 3.19 MIL/uL — ABNORMAL LOW (ref 4.22–5.81)
Retic Count, Absolute: 71.5 10*3/uL (ref 19.0–186.0)
Retic Ct Pct: 2.2 % (ref 0.4–3.1)
Reticulocyte Hemoglobin: 35.1 pg (ref >27.9)

## 2022-01-10 LAB — LACTATE DEHYDROGENASE: LDH: 178 U/L (ref 98–192)

## 2022-01-10 NOTE — Progress Notes (Signed)
[name]  HEMATOLOGY ONCOLOGY CLINIC NOTE  Date of service: 01/10/2022   Patient Care Team: [name], MD as PCP - General (Internal Medicine)  Chief Complaint:  Follow-up for continued evaluation and management of CLL  Diagnosis: CLL  Current Treatment: Monitoring  INTERVAL HISTORY: Brandon Norris is a 89 y.o. male here for continued evaluation and management of his CLL.   Patient was last seen by me on 09/03/21 and reported weakness in thighs. He also reported a small red mass with a darkened spot in his upper left leg.   Today, he doing well overall. He complains of continuous weakness in his right thigh. He also complains of intermittent wobbly knees and lower back pain. He endorses hearing difficulty and is wearing his hearing aids.  He gained 25 pounds since previously being 123 pounds. He is currently 149 lbs. He has a steady weight and is eating well.  His mass removed. He was referred to a dermatologist, who was unsure if mass was cancerous as there were some atypical cells. Denies new lumps/bumps. Denies infection issues.  He has not received influenza vaccine, RSV, or COVID-19 booster.  His breathing has been stable.  He reported a diastolic BP of 57, which he states is the lowest is has ever been. He denies lightheadedness or dizziness. He does not stay hydrated. His blood pressure is stable in clinic today (126/57) and not concerning.  He experiences some cloudiness in his vision which causes some difficulty driving and trouble reading signs. He will receive a laser treatment next Tuesday to remove cloudiness.  Denies any unexplained fevers, chills, night sweats, new bone pains, unexpected weight loss, sleep difficulty, falls, or concerns with balance, abdominal pain. No falls or concerns with balance.      REVIEW OF SYSTEMS:   .10 Point review of Systems was done is negative except as noted above.    Past Medical History:  Diagnosis Date   Actinic  keratoses    Allergic rhinitis    Atypical nevi    Diverticulosis    Enlarged prostate    Glaucoma    Glaucoma    H/O degenerative disc disease    Hx of adenomatous colonic polyps    Malignant melanoma of skin of abdomen ([place]) 2016   Rotator cuff tendinitis    Thrombocytopenia ([place])     . Past Surgical History:  Procedure Laterality Date   HERNIA REPAIR     Left inguinal herniorrhaphy     Melanoma on abdomen  2016     Social History   Tobacco Use   Smoking status: Never   Smokeless tobacco: Never  Vaping Use   Vaping Use: Never used  Substance Use Topics   Alcohol use: Yes    Alcohol/week: 3.0 standard drinks of alcohol    Types: 3 Glasses of wine per week   Drug use: No    ALLERGIES:  has No Known Allergies.  MEDICATIONS:  Current Outpatient Medications  Medication Sig Dispense Refill   b complex vitamins capsule Take 1 capsule by mouth daily.     Cholecalciferol (VITAMIN D3) 50 MCG (2000 UT) TABS Take 1 tablet by mouth daily at 2 PM.     dorzolamide-timolol (COSOPT) 22.3-6.8 MG/ML ophthalmic solution Place 1 drop into both eyes 2 (two) times daily.     latanoprost (XALATAN) 0.005 % ophthalmic solution 1 drop into BOTH  eye in the evening     Multiple Vitamin (MULTIVITAMINS PO)      vitamin B-12 (  CYANOCOBALAMIN) 250 MCG tablet Take 250 mcg by mouth daily.     No current facility-administered medications for this visit.    PHYSICAL EXAMINATION: .BP (!) 126/57 (BP Location: Left Arm, Patient Position: Sitting) Comment: nurse notfied  Pulse 70   Temp 97.9 F (36.6 C) (Oral)   Resp 17   Wt 149 lb 6.4 oz (67.8 kg)   SpO2 99%   BMI 22.06 kg/m  NAD GENERAL:alert, in no acute distress and comfortable SKIN: no acute rashes. Ulcerated 3 cm x 3 cm lesion on the medial aspect of upper left leg. EYES: conjunctiva are pink and non-injected, sclera anicteric NECK: supple, no JVD LYMPH:  no palpable lymphadenopathy in the cervical, axillary or inguinal  regions LUNGS: clear to auscultation b/l with normal respiratory effort HEART: regular rate & rhythm ABDOMEN:  normoactive bowel sounds , non tender, not distended. Extremity: no pedal edema PSYCH: alert & oriented x 3 with fluent speech NEURO: no focal motor/sensory deficits  LABORATORY DATA:   I have reviewed the data as listed  .    Latest Ref Rng & Units 01/10/2022    9:13 AM 09/03/2021    1:30 PM 04/30/2021    2:48 PM  CBC  WBC 4.0 - 10.5 K/uL 22.8  22.3  23.8   Hemoglobin 13.0 - 17.0 g/dL 11.0  10.9  10.9   Hematocrit 39.0 - 52.0 % 33.6  32.9  32.5   Platelets 150 - 400 K/uL 121  130  102     .    Latest Ref Rng & Units 09/03/2021    1:30 PM 04/30/2021    2:48 PM 01/28/2021    9:17 AM  CMP  Glucose 70 - 99 mg/dL 106  116  77   BUN 8 - 23 mg/dL '25  22  25   '$ Creatinine 0.61 - 1.24 mg/dL 1.24  1.11  1.03   Sodium 135 - 145 mmol/L 141  142  142   Potassium 3.5 - 5.1 mmol/L 4.1  4.2  4.4   Chloride 98 - 111 mmol/L 106  108  108   CO2 22 - 32 mmol/L 33  31  29   Calcium 8.9 - 10.3 mg/dL 9.6  9.3  8.8   Total Protein 6.5 - 8.1 g/dL 6.7  6.8  7.0   Total Bilirubin 0.3 - 1.2 mg/dL 0.4  0.4  0.4   Alkaline Phos 38 - 126 U/L 65  58  45   AST 15 - 41 U/L '16  13  18   '$ ALT 0 - 44 U/L '14  11  18     '$ [place] ABD (01/28/2016)  ABDOMEN ULTRASOUND COMPLETE   COMPARISON:  None.   FINDINGS: Gallbladder: No gallstones or wall thickening visualized. There is no pericholecystic fluid. No sonographic Murphy sign noted by sonographer.   Common bile duct: Diameter: 2 mm. There is no intrahepatic, common hepatic, or common bile duct dilatation.   Liver: No focal lesion identified. Within normal limits in parenchymal echogenicity.   IVC: No abnormality visualized.   Pancreas: Visualized portion unremarkable. Portions of pancreas obscured by gas.   Spleen: Spleen measures 16.0 x 14.2 x 5.8 cm with a measures splenic volume of 621 cubic cm. No focal splenic lesions are evident.    Right Kidney: Length: 11.4 cm. Echogenicity within normal limits. No hydronephrosis visualized. There is a parapelvic cyst in the left kidney measuring 5.1 x 3.5 x 6.3 cm.   Left Kidney: Length: 11.1 cm. Echogenicity  within normal limits. No mass or hydronephrosis visualized.   Abdominal aorta: No aneurysm visualized.   Other findings: No demonstrable ascites.   IMPRESSION: Splenomegaly.  No focal splenic lesions evident.   Parapelvic cyst right kidney measuring 5.1 x 3.5 x 6.3 cm.   Portions of pancreas obscured by gas. Visualized portions of pancreas appear normal.   Study otherwise unremarkable.     Electronically Signed   By: Lowella Grip III M.D.   On: 01/28/2016 14:09  FISH, CLL Prognostic on 08/26/16   RADIOGRAPHIC STUDIES: I have personally reviewed the radiological images as listed and agreed with the findings in the report. No results found.  ASSESSMENT & PLAN:   89 y.o. male with  1) Rai Stage 2 Chronic lymphocytic leukemia. (with splenomegaly) Trisomy 12 and a 13q deletion.  Would be stage II if splenomegaly was considered to be related to the CLL. Patient however notes that he has had splenomegaly for decades without clear etiology noted previously.  WBC counts are not changed significantly. No anemia Mild thrombocytopenia that is not significantly changed platelets around 101k.  No constitutional symptoms. No issues with infections/bleeding. No clinically palpable lymphadenopathy ( US showed enlarged spleen 16x14.2x5.8 cms)  2) status post anorexia with weight loss.  Patient is now eating much better and has gained 7 pounds in the last 2 to 3 months.  Resolved with steroids  3) previous hypercalcemia and elevated creatinine which had resolved.  Resolved with steroids  4) macrocytic anemia B12 borderline low at 281 up from 233. Has been taking B complex 1 capsule daily Would recommend taking additional B12 1000 mcg daily  over-the-counter.   5) Incidentally noted left parapelvic right kidney cyst measuring 5.1 x 3.5 x 6.3 cm in size. Follow-up with PCP with continued management  Ct abd- 9/13-- simple rt renal cyst noted. No concerning renal lesions.   6) . Patient Active Problem List   Diagnosis Date Noted   Hypercalcemia 09/13/2020   CLL (chronic lymphocytic leukemia) ([place]) 01/21/2016   Elevated WBC count 01/16/2016   Glaucoma 01/16/2016   Thrombocytopenia ([place]) 01/16/2016   Diverticulosis 01/16/2016   Encounter for colonoscopy due to history of adenomatous colonic polyps 01/16/2016   Allergic rhinitis 01/16/2016  -f/u with PCP for mx  PLAN: -Labs done today 09/03/2021 reviewed with him in detail.  CBC shows stable hemoglobin of 10.9, WBC count of 22.3k , platelets 130k. CMP stable LDH wnl at 178 ferritin .No results found for: "IRON", "TIBC", "IRONPCTSAT" (Iron and TIBC)  Lab Results  Component Value Date   FERRITIN 81 09/03/2021   Retic panel stable WNL except RBC count of 3.30. -discussed labs from 01/10/22 with patient. Blood counts stable. CBC showed WBC of 22.8 K, hemoglobin stable 11.0 K, and platelets steady at 121 K. CMP pending. -recommended to receive influenza vaccine, RSV, and COVID-19 booster. -recommend to ask eye doctor if okay to drive due to cloudiness in his vision -will follow up on CMP lab results  - no indication to treat CLL at this time   FOLLOW UP: RTC with Dr Irene Limbo with labs in 4 months  The total time spent in the appointment was *** minutes* .  All of the patient's questions were answered with apparent satisfaction. The patient knows to call the clinic with any problems, questions or concerns.   Sullivan Lone MD MS AAHIVMS [facility] Hematology/Oncology Physician [facility]  .*Total Encounter Time as defined by the Centers for Medicare and Medicaid Services includes,  in addition to the face-to-face time of a patient visit (documented in the note  above) non-face-to-face time: obtaining and reviewing outside history, ordering and reviewing medications, tests or procedures, care coordination (communications with other health care professionals or caregivers) and documentation in the medical record.   I,Mitra Faeizi,acting as a Education administrator for Sullivan Lone, MD.,have documented all relevant documentation on the behalf of Sullivan Lone, MD,as directed by  Sullivan Lone, MD while in the presence of Sullivan Lone, MD.  ***

## 2022-01-14 DIAGNOSIS — H26491 Other secondary cataract, right eye: Secondary | ICD-10-CM | POA: Diagnosis not present

## 2022-01-15 ENCOUNTER — Encounter: Payer: Self-pay | Admitting: Hematology

## 2022-01-21 DIAGNOSIS — D485 Neoplasm of uncertain behavior of skin: Secondary | ICD-10-CM | POA: Diagnosis not present

## 2022-02-04 ENCOUNTER — Encounter: Payer: Self-pay | Admitting: Hematology

## 2022-02-04 DIAGNOSIS — K080 Exfoliation of teeth due to systemic causes: Secondary | ICD-10-CM | POA: Diagnosis not present

## 2022-03-17 DIAGNOSIS — I700 Atherosclerosis of aorta: Secondary | ICD-10-CM | POA: Diagnosis not present

## 2022-03-24 DIAGNOSIS — J309 Allergic rhinitis, unspecified: Secondary | ICD-10-CM | POA: Diagnosis not present

## 2022-03-24 DIAGNOSIS — Z23 Encounter for immunization: Secondary | ICD-10-CM | POA: Diagnosis not present

## 2022-03-24 DIAGNOSIS — Z85820 Personal history of malignant melanoma of skin: Secondary | ICD-10-CM | POA: Diagnosis not present

## 2022-03-24 DIAGNOSIS — K4090 Unilateral inguinal hernia, without obstruction or gangrene, not specified as recurrent: Secondary | ICD-10-CM | POA: Diagnosis not present

## 2022-03-24 DIAGNOSIS — Z0000 Encounter for general adult medical examination without abnormal findings: Secondary | ICD-10-CM | POA: Diagnosis not present

## 2022-03-24 DIAGNOSIS — N401 Enlarged prostate with lower urinary tract symptoms: Secondary | ICD-10-CM | POA: Diagnosis not present

## 2022-04-07 ENCOUNTER — Ambulatory Visit (INDEPENDENT_AMBULATORY_CARE_PROVIDER_SITE_OTHER): Payer: Medicare Other

## 2022-04-07 ENCOUNTER — Ambulatory Visit: Payer: Medicare Other | Admitting: Family Medicine

## 2022-04-07 VITALS — BP 142/58 | HR 80 | Ht 69.0 in | Wt 144.0 lb

## 2022-04-07 DIAGNOSIS — M5442 Lumbago with sciatica, left side: Secondary | ICD-10-CM

## 2022-04-07 DIAGNOSIS — M5441 Lumbago with sciatica, right side: Secondary | ICD-10-CM

## 2022-04-07 DIAGNOSIS — R29898 Other symptoms and signs involving the musculoskeletal system: Secondary | ICD-10-CM | POA: Diagnosis not present

## 2022-04-07 DIAGNOSIS — G8929 Other chronic pain: Secondary | ICD-10-CM | POA: Diagnosis not present

## 2022-04-07 DIAGNOSIS — M545 Low back pain, unspecified: Secondary | ICD-10-CM | POA: Diagnosis not present

## 2022-04-07 DIAGNOSIS — M47816 Spondylosis without myelopathy or radiculopathy, lumbar region: Secondary | ICD-10-CM | POA: Diagnosis not present

## 2022-04-07 NOTE — Progress Notes (Signed)
   Shirlyn Goltz, PhD, LAT, ATC acting as a scribe for Lynne Leader, MD.  Subjective:    CC: Thigh pain  HPI: Patient is an 89 year old male presenting with bilateral thigh weakness.  Patient locates weakness/pain to varying locations; anterior and posterior thighs.  Of note, patient is currently under the care of  oncology for chronic lymphocytic leukemia. Pt note that the weakness has been ongoing since he had a "low red blood" count and dehydration. He also reports a "ruptured disc" in his low back in 2020.   Low back pain: yes- esp w/ prolonged standing, midline Radiates: no LE Numbness/tingling: no LE Weakness: yes- only in thighs Aggravates: worse 1st thing in the morning Treatments tried: none  Pertinent review of Systems: No fevers or chills  Relevant historical information: CLL   Objective:    Vitals:   04/07/22 1422  BP: (!) 142/58  Pulse: 80  SpO2: 98%   General: Well Developed, well nourished, and in no acute distress.   MSK: L-spine: Normal appearing Nontender palpation spinal midline.  Normal lumbar motion. Lower extremity strength reflexes are equal normal throughout.   Lab and Radiology Results  X-ray images lumbar spine obtained today personally independently interpreted Multilevel DDD with degenerative scoliosis.  No acute fractures are visible. Await formal radiology review    Impression and Recommendations:    Assessment and Plan: 89 y.o. male with bilateral lower extremity pain and weakness.  Symptoms are difficult to pin down but probably correspond L5 bilaterally best. He likely has multilevel spinal stenosis if we were to do an MRI.  Regardless he is a good candidate for trial of physical therapy.  Plan for trial of PT.  Check back in about 6 weeks if not improved consider lumbar spine MRI.  PDMP not reviewed this encounter. Orders Placed This Encounter  Procedures   DG Lumbar Spine 2-3 Views    Standing Status:   Future    Number  of Occurrences:   1    Standing Expiration Date:   05/07/2022    Order Specific Question:   Reason for Exam (SYMPTOM  OR DIAGNOSIS REQUIRED)    Answer:   low back pain    Order Specific Question:   Preferred imaging location?    Answer:   [facility]   Ambulatory referral to Physical Therapy    Referral Priority:   Routine    Referral Type:   Physical Medicine    Referral Reason:   Specialty Services Required    Requested Specialty:   Physical Therapy   No orders of the defined types were placed in this encounter.   Discussed warning signs or symptoms. Please see discharge instructions. Patient expresses understanding.   The above documentation has been reviewed and is accurate and complete Lynne Leader, M.D.

## 2022-04-07 NOTE — Patient Instructions (Addendum)
Thank you for coming in today.   Please get an Xray today before you leave   I've referred you to Physical Therapy.  Let us know if you don't hear from them in one week.   Recheck in about 6 weeks.   Let me know if this is not working.    

## 2022-04-08 ENCOUNTER — Encounter: Payer: Self-pay | Admitting: Hematology

## 2022-04-09 DIAGNOSIS — C44722 Squamous cell carcinoma of skin of right lower limb, including hip: Secondary | ICD-10-CM | POA: Diagnosis not present

## 2022-04-09 DIAGNOSIS — L570 Actinic keratosis: Secondary | ICD-10-CM | POA: Diagnosis not present

## 2022-04-09 DIAGNOSIS — Z85828 Personal history of other malignant neoplasm of skin: Secondary | ICD-10-CM | POA: Diagnosis not present

## 2022-04-09 DIAGNOSIS — D1801 Hemangioma of skin and subcutaneous tissue: Secondary | ICD-10-CM | POA: Diagnosis not present

## 2022-04-09 DIAGNOSIS — L814 Other melanin hyperpigmentation: Secondary | ICD-10-CM | POA: Diagnosis not present

## 2022-04-09 DIAGNOSIS — L821 Other seborrheic keratosis: Secondary | ICD-10-CM | POA: Diagnosis not present

## 2022-04-09 NOTE — Progress Notes (Signed)
Lumbar spine x-ray shows multilevel arthritis changes

## 2022-04-14 ENCOUNTER — Ambulatory Visit: Payer: Medicare Other | Attending: Family Medicine

## 2022-04-14 ENCOUNTER — Other Ambulatory Visit: Payer: Self-pay

## 2022-04-14 DIAGNOSIS — R29898 Other symptoms and signs involving the musculoskeletal system: Secondary | ICD-10-CM | POA: Diagnosis not present

## 2022-04-14 DIAGNOSIS — M5442 Lumbago with sciatica, left side: Secondary | ICD-10-CM | POA: Insufficient documentation

## 2022-04-14 DIAGNOSIS — M5459 Other low back pain: Secondary | ICD-10-CM

## 2022-04-14 DIAGNOSIS — M5441 Lumbago with sciatica, right side: Secondary | ICD-10-CM | POA: Insufficient documentation

## 2022-04-14 DIAGNOSIS — G8929 Other chronic pain: Secondary | ICD-10-CM | POA: Insufficient documentation

## 2022-04-14 DIAGNOSIS — R262 Difficulty in walking, not elsewhere classified: Secondary | ICD-10-CM | POA: Diagnosis not present

## 2022-04-14 DIAGNOSIS — M6281 Muscle weakness (generalized): Secondary | ICD-10-CM

## 2022-04-14 NOTE — Therapy (Signed)
OUTPATIENT PHYSICAL THERAPY THORACOLUMBAR EVALUATION   Patient Name: Brandon Norris MRN: [id number] DOB:[birth year],89 y.o., male Today's Date: 04/14/2022   END OF SESSION:  PT End of Session - 04/14/22 1227     Visit Number 1    Number of Visits 17    Date for PT Re-Evaluation 06/13/22    Authorization Type MCR    PT Start Time 1146    PT Stop Time 1230    PT Time Calculation (min) 44 min    Equipment Utilized During Treatment Gait belt    Activity Tolerance Patient tolerated treatment well    Behavior During Therapy WFL for tasks assessed/performed              Past Medical History:  Diagnosis Date   Actinic keratoses    Allergic rhinitis    Atypical nevi    Diverticulosis    Enlarged prostate    Glaucoma    Glaucoma    H/O degenerative disc disease    Hx of adenomatous colonic polyps    Malignant melanoma of skin of abdomen 2016   Rotator cuff tendinitis    Thrombocytopenia    Past Surgical History:  Procedure Laterality Date   HERNIA REPAIR     Left inguinal herniorrhaphy     Melanoma on abdomen  2016   Patient Active Problem List   Diagnosis Date Noted   Skin lesion of left lower limb 09/30/2021   Hypercalcemia 09/13/2020   CLL (chronic lymphocytic leukemia) 01/21/2016   Elevated WBC count 01/16/2016   Glaucoma 01/16/2016   Thrombocytopenia 01/16/2016   Diverticulosis 01/16/2016   Encounter for colonoscopy due to history of adenomatous colonic polyps 01/16/2016   Allergic rhinitis 01/16/2016    PCP: Merri Brunette, MD  REFERRING PROVIDER: Rodolph Bong, MD  REFERRING DIAG:  M54.41,M54.42,G89.29 (ICD-10-CM) - Chronic midline low back pain with bilateral sciatica  R29.898 (ICD-10-CM) - Weakness of both lower extremities    Rationale for Evaluation and Treatment: Rehabilitation  THERAPY DIAG:  Other low back pain  Muscle weakness (generalized)  Difficulty in walking, not elsewhere classified  ONSET DATE: chronic   SUBJECTIVE:                                                                                                                                                                                            SUBJECTIVE STATEMENT: "It's not pain but it's uncomfortable and tired" along bilateral anterolateral thighs that he notices when he first wakes up that stays the same throughout the day. He reports he gets "tired and uncomfortable" in his low back if he stands for too long. He  reports noticing this uncomfortable sensation and fatigue began following a low RBC and dehydration 2 years ago. He was diagnosed with CLL about 4 years ago and is being monitored, but is not undergoing any treatment for this. He denies any numbness or tingling. No changes in bowel/bladder. He reports about 24 years ago he had "ruptured disc in his back that he solved by walking." He feels that the discomfort and fatigue has stayed the same since onset.   PERTINENT HISTORY:  Chronic lymphocytic leukemia   PAIN:  Are you having pain? No  PRECAUTIONS: Fall and Other: active cancer  WEIGHT BEARING RESTRICTIONS: No  FALLS:  Has patient fallen in last 6 months? No  LIVING ENVIRONMENT: Lives with: lives alone Lives in: House/apartment Stairs: Yes: Internal: 14 steps; can reach both Has following equipment at home: Dan Humphreys - 2 wheeled  OCCUPATION: retired; does some Airline pilot work    PLOF: Independent  PATIENT GOALS: "I want to get rid of this feeling in my legs, so I can be more active."    OBJECTIVE:   DIAGNOSTIC FINDINGS:  Lumbar X-ray: IMPRESSION: Multilevel spondylosis, disc space height loss, and degenerative endplate changes greatest at L2-L3 and L3-L4 where it is advanced.  PATIENT SURVEYS:  FOTO 61% function to 66% predicted  SCREENING FOR RED FLAGS: Bowel or bladder incontinence: No Spinal tumors: No Cauda equina syndrome: No Compression fracture: No Abdominal aneurysm: No  COGNITION: Overall cognitive status: Within  functional limits for tasks assessed     SENSATION: Not tested  MUSCLE LENGTH: Hamstrings: moderate tightness bilaterally  Thomas test: not formally tested, but notable tightness when patient lies in supine   POSTURE:  forward flexed posture  PALPATION: Not assessed   LUMBAR ROM:   AROM eval  Flexion WNL  Extension 75% limited   Right lateral flexion 50% limited pn  Left lateral flexion 50% limited   Right rotation WNL  Left rotation WNL   (Blank rows = not tested)  LOWER EXTREMITY ROM:     Active  Right eval Left eval  Hip flexion    Hip extension    Hip abduction    Hip adduction    Hip internal rotation    Hip external rotation    Knee flexion    Knee extension    Ankle dorsiflexion    Ankle plantarflexion    Ankle inversion    Ankle eversion     (Blank rows = not tested)  LOWER EXTREMITY MMT:    MMT Right eval Left eval  Hip flexion 4 4-  Hip extension Unable to lie prone Unable to lie prone  Hip abduction 4+ 4+  Hip adduction    Hip internal rotation    Hip external rotation    Knee flexion 5 5  Knee extension 5 5  Ankle dorsiflexion    Ankle plantarflexion    Ankle inversion    Ankle eversion     (Blank rows = not tested)  LUMBAR SPECIAL TESTS:  Not tested   FUNCTIONAL TESTS:  5 x STS: 14 seconds   6 MWT: 1061ft BORG RPE 12; after 2 minutes he reported low back discomfort and fatigue in lower extremities   GAIT: Distance walked: 6 MWT  Assistive device utilized: None Level of assistance: Complete Independence Comments: forward flexed posture, WBOS  [facility] Adult PT Treatment:  DATE:04/14/22  Therapeutic Exercise: Demonstrated and issued initial HEP.    Therapeutic Activity: Education on assessment findings that will be addressed throughout duration of POC.     PATIENT EDUCATION:  Education details: see treatment Person educated: Patient Education method: Explanation,  Demonstration, Tactile cues, Verbal cues, and Handouts Education comprehension: verbalized understanding, returned demonstration, verbal cues required, tactile cues required, and needs further education  HOME EXERCISE PROGRAM: Access Code: [email or web address] URL: [email or web address] Date: 04/14/2022 Prepared by: Letitia LibraSamantha Harmonee Tozer  Exercises - Seated Hamstring Stretch  - 1 x daily - 7 x weekly - 3 sets - 30 sec  hold - Standing March with Counter Support  - 1 x daily - 7 x weekly - 2 sets - 10 reps - Standing Hip Extension with Counter Support  - 1 x daily - 7 x weekly - 2 sets - 10 reps  ASSESSMENT:  CLINICAL IMPRESSION: Patient is a 89 y.o. male who was seen today for physical therapy evaluation and treatment for low back pain and bilateral thigh weakness that has been ongoing for a couple years noting this discomfort and weakness after a bout of dehydration and low red blood cell count. He reports being limited in his standing and walking activity currently due to this discomfort and fatigue. Upon assessment he is noted to have limited trunk extension and lateral flexion, noting pain with Rt lateral flexion. He has bilateral hip weakness and scores below the age-related normative value for the 6 MWT. In addition he is noted to have hamstring and hip flexor tightness and postural abnormalities. He will benefit from skilled PT to address the above stated deficits in order to optimize his function. Of note he was diagnosed with Chronic lymphocytic leukemia and is currently under the care of oncology, but is not undergoing treatment at this time.   OBJECTIVE IMPAIRMENTS: Abnormal gait, decreased activity tolerance, decreased endurance, decreased knowledge of condition, difficulty walking, decreased ROM, decreased strength, impaired flexibility, postural dysfunction, and pain.   ACTIVITY LIMITATIONS: standing and locomotion level  PARTICIPATION LIMITATIONS: meal prep, cleaning, laundry,  shopping, community activity, and yard work  PERSONAL FACTORS: Age, Fitness, Time since onset of injury/illness/exacerbation, and 1 comorbidity: Chronic lymphocytic leukemia   are also affecting patient's functional outcome.   REHAB POTENTIAL: Good  CLINICAL DECISION MAKING: Evolving/moderate complexity  EVALUATION COMPLEXITY: Moderate   GOALS: Goals reviewed with patient? Yes  SHORT TERM GOALS: Target date: 05/12/2022    Patient will be independent and compliant with initial HEP.   Baseline: see above Goal status: INITIAL  2.  Patient will complete 5 x STS in </= 12 seconds to improve functional strength.  Baseline: see above  Goal status: INITIAL  3.  Patient will be able to complete 10 minutes of continuous standing activity with minimal fatigue/discomfort reported.  Baseline: reported back discomfort and tiredness in his legs after 2 minutes of walking.  Goal status: INITIAL   LONG TERM GOALS: Target date: 06/13/2022    Patient will walk at least 1,368 ft during 6 MWT to signify an improvement in walking endurance.  Baseline: see above Goal status: INITIAL  2.  Patient will demonstrate 4+/5 bilateral hip flexor strength to improve stability with walking activity.  Baseline: see above Goal status: INITIAL  3.  Patient will be able to complete 30 minutes of continuous standing activity to improve tolerance to completing household activities.  Baseline: see above Goal status: INITIAL  4.  Patient will score at least 66% on FOTO to signify clinically meaningful  improvement in functional abilities.   Baseline: see above Goal status: INITIAL  5.  Patient will be independent with advanced home program to progress/maintain current level of function.  Baseline: see above  Goal status: INITIAL   PLAN:  PT FREQUENCY: 1-2x/week  PT DURATION: 8 weeks  PLANNED INTERVENTIONS: Therapeutic exercises, Therapeutic activity, Neuromuscular re-education, Balance training,  Gait training, Patient/Family education, Self Care, Dry Needling, Manual therapy, and Re-evaluation.  PLAN FOR NEXT SESSION: review and progress HEP prn; hip/core strengthening, progress standing endurance as able; hamstring/hip flexor stretching. Lumbar mobility.    Letitia Libra, PT, DPT, ATC 04/14/22 1:34 PM

## 2022-04-17 ENCOUNTER — Ambulatory Visit: Payer: Medicare Other

## 2022-04-17 DIAGNOSIS — M6281 Muscle weakness (generalized): Secondary | ICD-10-CM

## 2022-04-17 DIAGNOSIS — M5441 Lumbago with sciatica, right side: Secondary | ICD-10-CM | POA: Diagnosis not present

## 2022-04-17 DIAGNOSIS — M5442 Lumbago with sciatica, left side: Secondary | ICD-10-CM | POA: Diagnosis not present

## 2022-04-17 DIAGNOSIS — G8929 Other chronic pain: Secondary | ICD-10-CM | POA: Diagnosis not present

## 2022-04-17 DIAGNOSIS — R262 Difficulty in walking, not elsewhere classified: Secondary | ICD-10-CM

## 2022-04-17 DIAGNOSIS — M5459 Other low back pain: Secondary | ICD-10-CM | POA: Diagnosis not present

## 2022-04-17 DIAGNOSIS — R29898 Other symptoms and signs involving the musculoskeletal system: Secondary | ICD-10-CM | POA: Diagnosis not present

## 2022-04-17 NOTE — Therapy (Signed)
OUTPATIENT PHYSICAL THERAPY TREATMENT NOTE   Patient Name: Brandon Norris MRN: [id number] DOB:1930/06/16, 89 y.o., male Today's Date: 04/17/2022  PCP: Merri BrunettePharr, Walter, MD  REFERRING PROVIDER: Rodolph Bongorey, Evan S, MD   END OF SESSION:   PT End of Session - 04/17/22 1356     Visit Number 2    Number of Visits 17    Date for PT Re-Evaluation 06/13/22    Authorization Type MCR    PT Start Time 1400    PT Stop Time 1442    PT Time Calculation (min) 42 min    Equipment Utilized During Treatment --    Activity Tolerance Patient tolerated treatment well    Behavior During Therapy WFL for tasks assessed/performed             Past Medical History:  Diagnosis Date   Actinic keratoses    Allergic rhinitis    Atypical nevi    Diverticulosis    Enlarged prostate    Glaucoma    Glaucoma    H/O degenerative disc disease    Hx of adenomatous colonic polyps    Malignant melanoma of skin of abdomen 2016   Rotator cuff tendinitis    Thrombocytopenia    Past Surgical History:  Procedure Laterality Date   HERNIA REPAIR     Left inguinal herniorrhaphy     Melanoma on abdomen  2016   Patient Active Problem List   Diagnosis Date Noted   Skin lesion of left lower limb 09/30/2021   Hypercalcemia 09/13/2020   CLL (chronic lymphocytic leukemia) 01/21/2016   Elevated WBC count 01/16/2016   Glaucoma 01/16/2016   Thrombocytopenia 01/16/2016   Diverticulosis 01/16/2016   Encounter for colonoscopy due to history of adenomatous colonic polyps 01/16/2016   Allergic rhinitis 01/16/2016    REFERRING DIAG:  M54.41,M54.42,G89.29 (ICD-10-CM) - Chronic midline low back pain with bilateral sciatica  R29.898 (ICD-10-CM) - Weakness of both lower extremities     THERAPY DIAG:  Other low back pain  Muscle weakness (generalized)  Difficulty in walking, not elsewhere classified  Rationale for Evaluation and Treatment Rehabilitation  PERTINENT HISTORY: Chronic lymphocytic leukemia    PRECAUTIONS: Fall and Other: active cancer   SUBJECTIVE:                                                                                                                                                                                      SUBJECTIVE STATEMENT:  Patient reported noticing more weakness in his knees yesterday. No pain, just weakness. He reports completing his HEP everyday.    PAIN:  Are you having pain? No   OBJECTIVE: (objective measures completed at initial evaluation  unless otherwise dated)  DIAGNOSTIC FINDINGS:  Lumbar X-ray: IMPRESSION: Multilevel spondylosis, disc space height loss, and degenerative endplate changes greatest at L2-L3 and L3-L4 where it is advanced.   PATIENT SURVEYS:  FOTO 61% function to 66% predicted   SCREENING FOR RED FLAGS: Bowel or bladder incontinence: No Spinal tumors: No Cauda equina syndrome: No Compression fracture: No Abdominal aneurysm: No   COGNITION: Overall cognitive status: Within functional limits for tasks assessed                          SENSATION: Not tested   MUSCLE LENGTH: Hamstrings: moderate tightness bilaterally  Thomas test: not formally tested, but notable tightness when patient lies in supine    POSTURE:  forward flexed posture   PALPATION: Not assessed    LUMBAR ROM:    AROM eval  Flexion WNL  Extension 75% limited   Right lateral flexion 50% limited pn  Left lateral flexion 50% limited   Right rotation WNL  Left rotation WNL   (Blank rows = not tested)   LOWER EXTREMITY ROM:      Active  Right eval Left eval  Hip flexion      Hip extension      Hip abduction      Hip adduction      Hip internal rotation      Hip external rotation      Knee flexion      Knee extension      Ankle dorsiflexion      Ankle plantarflexion      Ankle inversion      Ankle eversion       (Blank rows = not tested)   LOWER EXTREMITY MMT:     MMT Right eval Left eval  Hip flexion 4 4-  Hip  extension Unable to lie prone Unable to lie prone  Hip abduction 4+ 4+  Hip adduction      Hip internal rotation      Hip external rotation      Knee flexion 5 5  Knee extension 5 5  Ankle dorsiflexion      Ankle plantarflexion      Ankle inversion      Ankle eversion       (Blank rows = not tested)   LUMBAR SPECIAL TESTS:  Not tested    FUNCTIONAL TESTS:  5 x STS: 14 seconds    6 MWT: 1028ft BORG RPE 12; after 2 minutes he reported low back discomfort and fatigue in lower extremities    GAIT: Distance walked: 6 MWT  Assistive device utilized: None Level of assistance: Complete Independence Comments: forward flexed posture, WBOS [facility] Adult PT Treatment:                                                DATE: 04/17/22 Therapeutic Exercise: NuStep level 4 x 5 minutes UE/LE LTR x 1 minute  Supine hip flexor stretch x 1 minute SKTC x 5 each; 5 sec hold  Clamshells 2 x 10  Hip bridge 2 x 10  Open book x 10 each  HS stretch x 30 sec each  Standing hip abduction x 10 each  Standing hip extension x 10 each Standing march x 10 each  Updated HEP  [facility] Adult PT Treatment:  DATE:04/14/22   Therapeutic Exercise: Demonstrated and issued initial HEP.      Therapeutic Activity: Education on assessment findings that will be addressed throughout duration of POC.        PATIENT EDUCATION:  Education details: see treatment Person educated: Patient Education method: Explanation, Demonstration, Tactile cues, Verbal cues, and Handouts Education comprehension: verbalized understanding, returned demonstration, verbal cues required, tactile cues required, and needs further education   HOME EXERCISE PROGRAM: Access Code: [email or web address] URL: [email or web address] Date: 04/14/2022 Prepared by: Letitia Libra   Exercises - Seated Hamstring Stretch  - 1 x daily - 7 x weekly - 3 sets - 30 sec  hold - Standing March with Counter Support   - 1 x daily - 7 x weekly - 2 sets - 10 reps - Standing Hip Extension with Counter Support  - 1 x daily - 7 x weekly - 2 sets - 10 reps   ASSESSMENT:   CLINICAL IMPRESSION: Patient arrives for first f/u without reports of pain. Focused on lumbar mobility and hip strengthening with good tolerance. He has initial complaints of low back pain when lying supine, but this is relieved when he assumes hooklying position. With standing hip strengthening he has mild difficulty maintaining upright posture as he has tendency to flex his trunk. He reported mild fatigue in his thighs at conclusion of session. HEP was updated to include further stretching and strengthening.    OBJECTIVE IMPAIRMENTS: Abnormal gait, decreased activity tolerance, decreased endurance, decreased knowledge of condition, difficulty walking, decreased ROM, decreased strength, impaired flexibility, postural dysfunction, and pain.    ACTIVITY LIMITATIONS: standing and locomotion level   PARTICIPATION LIMITATIONS: meal prep, cleaning, laundry, shopping, community activity, and yard work   PERSONAL FACTORS: Age, Fitness, Time since onset of injury/illness/exacerbation, and 1 comorbidity: Chronic lymphocytic leukemia   are also affecting patient's functional outcome.    REHAB POTENTIAL: Good   CLINICAL DECISION MAKING: Evolving/moderate complexity   EVALUATION COMPLEXITY: Moderate     GOALS: Goals reviewed with patient? Yes   SHORT TERM GOALS: Target date: 05/12/2022       Patient will be independent and compliant with initial HEP.    Baseline: see above Goal status: INITIAL   2.  Patient will complete 5 x STS in </= 12 seconds to improve functional strength.  Baseline: see above  Goal status: INITIAL   3.  Patient will be able to complete 10 minutes of continuous standing activity with minimal fatigue/discomfort reported.  Baseline: reported back discomfort and tiredness in his legs after 2 minutes of walking.  Goal  status: INITIAL     LONG TERM GOALS: Target date: 06/13/2022       Patient will walk at least 1,368 ft during 6 MWT to signify an improvement in walking endurance.  Baseline: see above Goal status: INITIAL   2.  Patient will demonstrate 4+/5 bilateral hip flexor strength to improve stability with walking activity.  Baseline: see above Goal status: INITIAL   3.  Patient will be able to complete 30 minutes of continuous standing activity to improve tolerance to completing household activities.  Baseline: see above Goal status: INITIAL   4.  Patient will score at least 66% on FOTO to signify clinically meaningful improvement in functional abilities.    Baseline: see above Goal status: INITIAL   5.  Patient will be independent with advanced home program to progress/maintain current level of function.  Baseline: see above  Goal status: INITIAL     PLAN:  PT FREQUENCY: 1-2x/week   PT DURATION: 8 weeks   PLANNED INTERVENTIONS: Therapeutic exercises, Therapeutic activity, Neuromuscular re-education, Balance training, Gait training, Patient/Family education, Self Care, Dry Needling, Manual therapy, and Re-evaluation.   PLAN FOR NEXT SESSION: review and progress HEP prn; hip/core strengthening, progress standing endurance as able; hamstring/hip flexor stretching. Lumbar mobility.    Letitia Libra, PT, DPT, ATC 04/17/22 2:42 PM

## 2022-04-24 ENCOUNTER — Ambulatory Visit: Payer: Medicare Other

## 2022-04-24 DIAGNOSIS — M5442 Lumbago with sciatica, left side: Secondary | ICD-10-CM | POA: Diagnosis not present

## 2022-04-24 DIAGNOSIS — R262 Difficulty in walking, not elsewhere classified: Secondary | ICD-10-CM

## 2022-04-24 DIAGNOSIS — M5459 Other low back pain: Secondary | ICD-10-CM | POA: Diagnosis not present

## 2022-04-24 DIAGNOSIS — G8929 Other chronic pain: Secondary | ICD-10-CM | POA: Diagnosis not present

## 2022-04-24 DIAGNOSIS — R29898 Other symptoms and signs involving the musculoskeletal system: Secondary | ICD-10-CM | POA: Diagnosis not present

## 2022-04-24 DIAGNOSIS — M6281 Muscle weakness (generalized): Secondary | ICD-10-CM

## 2022-04-24 DIAGNOSIS — M5441 Lumbago with sciatica, right side: Secondary | ICD-10-CM | POA: Diagnosis not present

## 2022-04-24 NOTE — Therapy (Signed)
OUTPATIENT PHYSICAL THERAPY TREATMENT NOTE   Patient Name: Brandon Norris MRN: [id number] DOB:1930-03-01, 89 y.o., male Today's Date: 04/24/2022  PCP: Merri Brunette, MD  REFERRING PROVIDER: Rodolph Bong, MD   END OF SESSION:   PT End of Session - 04/24/22 0929     Visit Number 3    Number of Visits 17    Date for PT Re-Evaluation 06/13/22    Authorization Type MCR    PT Start Time 0930    PT Stop Time 1013    PT Time Calculation (min) 43 min    Activity Tolerance Patient tolerated treatment well    Behavior During Therapy WFL for tasks assessed/performed              Past Medical History:  Diagnosis Date   Actinic keratoses    Allergic rhinitis    Atypical nevi    Diverticulosis    Enlarged prostate    Glaucoma    Glaucoma    H/O degenerative disc disease    Hx of adenomatous colonic polyps    Malignant melanoma of skin of abdomen 2016   Rotator cuff tendinitis    Thrombocytopenia    Past Surgical History:  Procedure Laterality Date   HERNIA REPAIR     Left inguinal herniorrhaphy     Melanoma on abdomen  2016   Patient Active Problem List   Diagnosis Date Noted   Skin lesion of left lower limb 09/30/2021   Hypercalcemia 09/13/2020   CLL (chronic lymphocytic leukemia) 01/21/2016   Elevated WBC count 01/16/2016   Glaucoma 01/16/2016   Thrombocytopenia 01/16/2016   Diverticulosis 01/16/2016   Encounter for colonoscopy due to history of adenomatous colonic polyps 01/16/2016   Allergic rhinitis 01/16/2016    REFERRING DIAG:  M54.41,M54.42,G89.29 (ICD-10-CM) - Chronic midline low back pain with bilateral sciatica  R29.898 (ICD-10-CM) - Weakness of both lower extremities     THERAPY DIAG:  Other low back pain  Muscle weakness (generalized)  Difficulty in walking, not elsewhere classified  Rationale for Evaluation and Treatment Rehabilitation  PERTINENT HISTORY: Chronic lymphocytic leukemia   PRECAUTIONS: Fall and Other: active cancer    SUBJECTIVE:                                                                                                                                                                                      SUBJECTIVE STATEMENT:  Patient reports he was really sore in the back yesterday and thinks it's from some of his exercises. He reports his thighs are feeling better, but is now noticing more of the "tiredness" in his knees.    PAIN:  Are you having pain?  No   OBJECTIVE: (objective measures completed at initial evaluation unless otherwise dated)  DIAGNOSTIC FINDINGS:  Lumbar X-ray: IMPRESSION: Multilevel spondylosis, disc space height loss, and degenerative endplate changes greatest at L2-L3 and L3-L4 where it is advanced.   PATIENT SURVEYS:  FOTO 61% function to 66% predicted   SCREENING FOR RED FLAGS: Bowel or bladder incontinence: No Spinal tumors: No Cauda equina syndrome: No Compression fracture: No Abdominal aneurysm: No   COGNITION: Overall cognitive status: Within functional limits for tasks assessed                          SENSATION: Not tested   MUSCLE LENGTH: Hamstrings: moderate tightness bilaterally  Thomas test: not formally tested, but notable tightness when patient lies in supine    POSTURE:  forward flexed posture   PALPATION: Not assessed    LUMBAR ROM:    AROM eval  Flexion WNL  Extension 75% limited   Right lateral flexion 50% limited pn  Left lateral flexion 50% limited   Right rotation WNL  Left rotation WNL   (Blank rows = not tested)   LOWER EXTREMITY ROM:      Active  Right eval Left eval  Hip flexion      Hip extension      Hip abduction      Hip adduction      Hip internal rotation      Hip external rotation      Knee flexion      Knee extension      Ankle dorsiflexion      Ankle plantarflexion      Ankle inversion      Ankle eversion       (Blank rows = not tested)   LOWER EXTREMITY MMT:     MMT Right eval Left eval  Hip  flexion 4 4-  Hip extension Unable to lie prone Unable to lie prone  Hip abduction 4+ 4+  Hip adduction      Hip internal rotation      Hip external rotation      Knee flexion 5 5  Knee extension 5 5  Ankle dorsiflexion      Ankle plantarflexion      Ankle inversion      Ankle eversion       (Blank rows = not tested)   LUMBAR SPECIAL TESTS:  Not tested    FUNCTIONAL TESTS:  5 x STS: 14 seconds  04/24/22: 5 x STS: 12 seconds    6 MWT: 1018ft BORG RPE 12; after 2 minutes he reported low back discomfort and fatigue in lower extremities    GAIT: Distance walked: 6 MWT  Assistive device utilized: None Level of assistance: Complete Independence Comments: forward flexed posture, WBOS [facility] Adult PT Treatment:                                                DATE: 04/24/22 Therapeutic Exercise: NuStep level 5 x 5 minutes UE/LE SKTC x 10 each; 5 sec hold  Hip bridge with abduction green band 2 x 10  SLR 2 x 10  Sidelying hip abduction 2 x 10  LAQ 2# 2 x 10  Sit to stand x 10 Updated HEP     [facility] Adult PT Treatment:  DATE: 04/17/22 Therapeutic Exercise: NuStep level 4 x 5 minutes UE/LE LTR x 1 minute  Supine hip flexor stretch x 1 minute SKTC x 5 each; 5 sec hold  Clamshells 2 x 10  Hip bridge 2 x 10  Open book x 10 each  HS stretch x 30 sec each  Standing hip abduction x 10 each  Standing hip extension x 10 each Standing march x 10 each  Updated HEP  [facility] Adult PT Treatment:                                                DATE:04/14/22   Therapeutic Exercise: Demonstrated and issued initial HEP.      Therapeutic Activity: Education on assessment findings that will be addressed throughout duration of POC.        PATIENT EDUCATION:  Education details: see treatment Person educated: Patient Education method: Explanation, Demonstration, Tactile cues, Verbal cues, and Handouts Education comprehension: verbalized  understanding, returned demonstration, verbal cues required, tactile cues required, and needs further education   HOME EXERCISE PROGRAM: Access Code: [email or web address] URL: [email or web address]    ASSESSMENT:   CLINICAL IMPRESSION: Patient arrives without reports of pain and reports an improvement in thigh fatigue, but is noticing this more so in his knees now. Focused on progression of LE strengthening with good tolerance. With SLR he requires intermittent cues to maintain quad activation through entire range and has some difficulty controlling eccentric phase. His 5 x STS has improved compared to initial evaluation, having met this STG. He does have difficulty controlling descent of sit to stand with occasional LOB requiring therapist assist. HEP was updated to include further strengthening.    OBJECTIVE IMPAIRMENTS: Abnormal gait, decreased activity tolerance, decreased endurance, decreased knowledge of condition, difficulty walking, decreased ROM, decreased strength, impaired flexibility, postural dysfunction, and pain.    ACTIVITY LIMITATIONS: standing and locomotion level   PARTICIPATION LIMITATIONS: meal prep, cleaning, laundry, shopping, community activity, and yard work   PERSONAL FACTORS: Age, Fitness, Time since onset of injury/illness/exacerbation, and 1 comorbidity: Chronic lymphocytic leukemia   are also affecting patient's functional outcome.    REHAB POTENTIAL: Good   CLINICAL DECISION MAKING: Evolving/moderate complexity   EVALUATION COMPLEXITY: Moderate     GOALS: Goals reviewed with patient? Yes   SHORT TERM GOALS: Target date: 05/12/2022       Patient will be independent and compliant with initial HEP.    Baseline: see above Goal status: met   2.  Patient will complete 5 x STS in </= 12 seconds to improve functional strength.  Baseline: see above  Goal status: met   3.  Patient will be able to complete 10 minutes of continuous standing activity with  minimal fatigue/discomfort reported.  Baseline: reported back discomfort and tiredness in his legs after 2 minutes of walking.  Goal status: INITIAL     LONG TERM GOALS: Target date: 06/13/2022       Patient will walk at least 1,368 ft during 6 MWT to signify an improvement in walking endurance.  Baseline: see above Goal status: INITIAL   2.  Patient will demonstrate 4+/5 bilateral hip flexor strength to improve stability with walking activity.  Baseline: see above Goal status: INITIAL   3.  Patient will be able to complete 30 minutes of continuous standing activity to improve tolerance to completing household activities.  Baseline:  see above Goal status: INITIAL   4.  Patient will score at least 66% on FOTO to signify clinically meaningful improvement in functional abilities.    Baseline: see above Goal status: INITIAL   5.  Patient will be independent with advanced home program to progress/maintain current level of function.  Baseline: see above  Goal status: INITIAL     PLAN:   PT FREQUENCY: 1-2x/week   PT DURATION: 8 weeks   PLANNED INTERVENTIONS: Therapeutic exercises, Therapeutic activity, Neuromuscular re-education, Balance training, Gait training, Patient/Family education, Self Care, Dry Needling, Manual therapy, and Re-evaluation.   PLAN FOR NEXT SESSION: review and progress HEP prn; hip/core strengthening, progress standing endurance as able; hamstring/hip flexor stretching. Lumbar mobility.    Letitia Libra, PT, DPT, ATC 04/24/22 10:14 AM

## 2022-04-29 ENCOUNTER — Encounter: Payer: Medicare Other | Admitting: Physical Therapy

## 2022-05-01 ENCOUNTER — Ambulatory Visit: Payer: Medicare Other

## 2022-05-01 DIAGNOSIS — R262 Difficulty in walking, not elsewhere classified: Secondary | ICD-10-CM | POA: Diagnosis not present

## 2022-05-01 DIAGNOSIS — M6281 Muscle weakness (generalized): Secondary | ICD-10-CM

## 2022-05-01 DIAGNOSIS — R29898 Other symptoms and signs involving the musculoskeletal system: Secondary | ICD-10-CM | POA: Diagnosis not present

## 2022-05-01 DIAGNOSIS — G8929 Other chronic pain: Secondary | ICD-10-CM | POA: Diagnosis not present

## 2022-05-01 DIAGNOSIS — M5442 Lumbago with sciatica, left side: Secondary | ICD-10-CM | POA: Diagnosis not present

## 2022-05-01 DIAGNOSIS — M5459 Other low back pain: Secondary | ICD-10-CM

## 2022-05-01 DIAGNOSIS — M5441 Lumbago with sciatica, right side: Secondary | ICD-10-CM | POA: Diagnosis not present

## 2022-05-01 NOTE — Therapy (Signed)
OUTPATIENT PHYSICAL THERAPY TREATMENT NOTE   Patient Name: Brandon Norris MRN: [id number] DOB:[birth year], 89 y.o., male Today's Date: 05/01/2022  PCP: Merri Brunette, MD  REFERRING PROVIDER: Rodolph Bong, MD   END OF SESSION:   PT End of Session - 05/01/22 0923     Visit Number 4    Number of Visits 17    Date for PT Re-Evaluation 06/13/22    Authorization Type MCR    PT Start Time 0926    PT Stop Time 1011    PT Time Calculation (min) 45 min    Activity Tolerance Patient tolerated treatment well    Behavior During Therapy [facility] for tasks assessed/performed               Past Medical History:  Diagnosis Date   Actinic keratoses    Allergic rhinitis    Atypical nevi    Diverticulosis    Enlarged prostate    Glaucoma    Glaucoma    H/O degenerative disc disease    Hx of adenomatous colonic polyps    Malignant melanoma of skin of abdomen 2016   Rotator cuff tendinitis    Thrombocytopenia    Past Surgical History:  Procedure Laterality Date   HERNIA REPAIR     Left inguinal herniorrhaphy     Melanoma on abdomen  2016   Patient Active Problem List   Diagnosis Date Noted   Skin lesion of left lower limb 09/30/2021   Hypercalcemia 09/13/2020   CLL (chronic lymphocytic leukemia) 01/21/2016   Elevated WBC count 01/16/2016   Glaucoma 01/16/2016   Thrombocytopenia 01/16/2016   Diverticulosis 01/16/2016   Encounter for colonoscopy due to history of adenomatous colonic polyps 01/16/2016   Allergic rhinitis 01/16/2016    REFERRING DIAG:  M54.41,M54.42,G89.29 (ICD-10-CM) - Chronic midline low back pain with bilateral sciatica  R29.898 (ICD-10-CM) - Weakness of both lower extremities     THERAPY DIAG:  Other low back pain  Muscle weakness (generalized)  Difficulty in walking, not elsewhere classified  Rationale for Evaluation and Treatment Rehabilitation  PERTINENT HISTORY: Chronic lymphocytic leukemia   PRECAUTIONS: Fall and Other: active cancer    SUBJECTIVE:                                                                                                                                                                                      SUBJECTIVE STATEMENT:  "I'm doing good. In my thighs and back it's better, but it seems to gravitate to my knees. Overall it's better."   PAIN:  Are you having pain? No   OBJECTIVE: (objective measures completed at initial evaluation unless otherwise dated)  DIAGNOSTIC  FINDINGS:  Lumbar X-ray: IMPRESSION: Multilevel spondylosis, disc space height loss, and degenerative endplate changes greatest at L2-L3 and L3-L4 where it is advanced.   PATIENT SURVEYS:  FOTO 61% function to 66% predicted   SCREENING FOR RED FLAGS: Bowel or bladder incontinence: No Spinal tumors: No Cauda equina syndrome: No Compression fracture: No Abdominal aneurysm: No   COGNITION: Overall cognitive status: Within functional limits for tasks assessed                          SENSATION: Not tested   MUSCLE LENGTH: Hamstrings: moderate tightness bilaterally  Thomas test: not formally tested, but notable tightness when patient lies in supine    POSTURE:  forward flexed posture   PALPATION: Not assessed    LUMBAR ROM:    AROM eval  Flexion WNL  Extension 75% limited   Right lateral flexion 50% limited pn  Left lateral flexion 50% limited   Right rotation WNL  Left rotation WNL   (Blank rows = not tested)   LOWER EXTREMITY ROM:      Active  Right eval Left eval  Hip flexion      Hip extension      Hip abduction      Hip adduction      Hip internal rotation      Hip external rotation      Knee flexion      Knee extension      Ankle dorsiflexion      Ankle plantarflexion      Ankle inversion      Ankle eversion       (Blank rows = not tested)   LOWER EXTREMITY MMT:     MMT Right eval Left eval  Hip flexion 4 4-  Hip extension Unable to lie prone Unable to lie prone  Hip abduction 4+  4+  Hip adduction      Hip internal rotation      Hip external rotation      Knee flexion 5 5  Knee extension 5 5  Ankle dorsiflexion      Ankle plantarflexion      Ankle inversion      Ankle eversion       (Blank rows = not tested)   LUMBAR SPECIAL TESTS:  Not tested    FUNCTIONAL TESTS:  5 x STS: 14 seconds  04/24/22: 5 x STS: 12 seconds    6 MWT: 1027ft BORG RPE 12; after 2 minutes he reported low back discomfort and fatigue in lower extremities   05/01/22: 6 MWT 1099 reports fatigue in thighs, but states that it is less compared to initial eval.    GAIT: Distance walked: 6 MWT  Assistive device utilized: None Level of assistance: Complete Independence Comments: forward flexed posture, WBOS  [facility] Adult PT Treatment:                                                DATE: 05/01/22 Therapeutic Exercise: NuStep level 5 x 5 minutes UE/LE  Sit to stand 2 x 10  LAQ 2 x 10; 2# Standing hip abduction 2 x 10;2#  Standing hip extension 2 x 10; 2# Standing HS curl 2 x 10; 2# Step taps 8 inch 2 x 10  6 MWT Updated HEP     [facility] Adult  PT Treatment:                                                DATE: 04/24/22 Therapeutic Exercise: NuStep level 5 x 5 minutes UE/LE SKTC x 10 each; 5 sec hold  Hip bridge with abduction green band 2 x 10  SLR 2 x 10  Sidelying hip abduction 2 x 10  LAQ 2# 2 x 10  Sit to stand x 10 Updated HEP     [facility] Adult PT Treatment:                                                DATE: 04/17/22 Therapeutic Exercise: NuStep level 4 x 5 minutes UE/LE LTR x 1 minute  Supine hip flexor stretch x 1 minute SKTC x 5 each; 5 sec hold  Clamshells 2 x 10  Hip bridge 2 x 10  Open book x 10 each  HS stretch x 30 sec each  Standing hip abduction x 10 each  Standing hip extension x 10 each Standing march x 10 each  Updated HEP     PATIENT EDUCATION:  Education details: see treatment Person educated: Patient Education method: Explanation, Demonstration,  Tactile cues, Verbal cues, and Handouts Education comprehension: verbalized understanding, returned demonstration, verbal cues required, tactile cues required, and needs further education   HOME EXERCISE PROGRAM: Access Code: [email or web address] URL: [email or web address]    ASSESSMENT:   CLINICAL IMPRESSION: Patient arrives without reports of pain. He demonstrates improved control of sit to stand compared to last session with no LOB noted during descent today, however does fatigue during second set of this exercise. He demonstrates improved tolerance to standing activity with ability to complete 12 minutes of standing strengthening followed by 6 MWT with minimal reports of thigh fatigue and no onset of pain.    OBJECTIVE IMPAIRMENTS: Abnormal gait, decreased activity tolerance, decreased endurance, decreased knowledge of condition, difficulty walking, decreased ROM, decreased strength, impaired flexibility, postural dysfunction, and pain.    ACTIVITY LIMITATIONS: standing and locomotion level   PARTICIPATION LIMITATIONS: meal prep, cleaning, laundry, shopping, community activity, and yard work   PERSONAL FACTORS: Age, Fitness, Time since onset of injury/illness/exacerbation, and 1 comorbidity: Chronic lymphocytic leukemia   are also affecting patient's functional outcome.    REHAB POTENTIAL: Good   CLINICAL DECISION MAKING: Evolving/moderate complexity   EVALUATION COMPLEXITY: Moderate     GOALS: Goals reviewed with patient? Yes   SHORT TERM GOALS: Target date: 05/12/2022       Patient will be independent and compliant with initial HEP.    Baseline: see above Goal status: met   2.  Patient will complete 5 x STS in </= 12 seconds to improve functional strength.  Baseline: see above  Goal status: met   3.  Patient will be able to complete 10 minutes of continuous standing activity with minimal fatigue/discomfort reported.  Baseline: reported back discomfort and tiredness  in his legs after 2 minutes of walking.  05/01/22: 12 minutes standing strengthening+ 6 minute walk Goal status: met     LONG TERM GOALS: Target date: 06/13/2022       Patient will walk at least 1,368 ft during 6 MWT to signify an improvement in walking  endurance.  Baseline: see above Goal status: INITIAL   2.  Patient will demonstrate 4+/5 bilateral hip flexor strength to improve stability with walking activity.  Baseline: see above Goal status: INITIAL   3.  Patient will be able to complete 30 minutes of continuous standing activity to improve tolerance to completing household activities.  Baseline: see above Goal status: INITIAL   4.  Patient will score at least 66% on FOTO to signify clinically meaningful improvement in functional abilities.    Baseline: see above Goal status: INITIAL   5.  Patient will be independent with advanced home program to progress/maintain current level of function.  Baseline: see above  Goal status: INITIAL     PLAN:   PT FREQUENCY: 1-2x/week   PT DURATION: 8 weeks   PLANNED INTERVENTIONS: Therapeutic exercises, Therapeutic activity, Neuromuscular re-education, Balance training, Gait training, Patient/Family education, Self Care, Dry Needling, Manual therapy, and Re-evaluation.   PLAN FOR NEXT SESSION: review and progress HEP prn; standing LE strengthening.     Letitia Libra, PT, DPT, ATC 05/01/22 10:16 AM

## 2022-05-06 DIAGNOSIS — H401133 Primary open-angle glaucoma, bilateral, severe stage: Secondary | ICD-10-CM | POA: Diagnosis not present

## 2022-05-08 ENCOUNTER — Ambulatory Visit: Payer: Medicare Other | Attending: Family Medicine

## 2022-05-08 DIAGNOSIS — R262 Difficulty in walking, not elsewhere classified: Secondary | ICD-10-CM | POA: Insufficient documentation

## 2022-05-08 DIAGNOSIS — M5459 Other low back pain: Secondary | ICD-10-CM | POA: Diagnosis not present

## 2022-05-08 DIAGNOSIS — M6281 Muscle weakness (generalized): Secondary | ICD-10-CM | POA: Insufficient documentation

## 2022-05-08 NOTE — Therapy (Signed)
OUTPATIENT PHYSICAL THERAPY TREATMENT NOTE   Patient Name: Brandon Norris MRN: [id number] DOB:[birth year], 89 y.o., male Today's Date: 05/08/2022  PCP: Merri Brunette, MD  REFERRING PROVIDER: Rodolph Bong, MD   END OF SESSION:   PT End of Session - 05/08/22 1444     Visit Number 5    Number of Visits 17    Date for PT Re-Evaluation 06/13/22    Authorization Type MCR    PT Start Time 1445    PT Stop Time 1528    PT Time Calculation (min) 43 min    Activity Tolerance Patient tolerated treatment well    Behavior During Therapy WFL for tasks assessed/performed               Past Medical History:  Diagnosis Date   Actinic keratoses    Allergic rhinitis    Atypical nevi    Diverticulosis    Enlarged prostate    Glaucoma    Glaucoma    H/O degenerative disc disease    Hx of adenomatous colonic polyps    Malignant melanoma of skin of abdomen (HCC) 2016   Rotator cuff tendinitis    Thrombocytopenia (HCC)    Past Surgical History:  Procedure Laterality Date   HERNIA REPAIR     Left inguinal herniorrhaphy     Melanoma on abdomen  2016   Patient Active Problem List   Diagnosis Date Noted   Skin lesion of left lower limb 09/30/2021   Hypercalcemia 09/13/2020   CLL (chronic lymphocytic leukemia) (HCC) 01/21/2016   Elevated WBC count 01/16/2016   Glaucoma 01/16/2016   Thrombocytopenia (HCC) 01/16/2016   Diverticulosis 01/16/2016   Encounter for colonoscopy due to history of adenomatous colonic polyps 01/16/2016   Allergic rhinitis 01/16/2016    REFERRING DIAG:  M54.41,M54.42,G89.29 (ICD-10-CM) - Chronic midline low back pain with bilateral sciatica  R29.898 (ICD-10-CM) - Weakness of both lower extremities     THERAPY DIAG:  Other low back pain  Muscle weakness (generalized)  Difficulty in walking, not elsewhere classified  Rationale for Evaluation and Treatment Rehabilitation  PERTINENT HISTORY: Chronic lymphocytic leukemia   PRECAUTIONS: Fall and  Other: active cancer   SUBJECTIVE:                                                                                                                                                                                      SUBJECTIVE STATEMENT:  Patient reports no pain, but feels weak in the thighs when he walks. 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    PAIN:  Are you having pain? No   OBJECTIVE: (objective measures  completed at initial evaluation unless otherwise dated)  DIAGNOSTIC FINDINGS:  Lumbar X-ray: IMPRESSION: Multilevel spondylosis, disc space height loss, and degenerative endplate changes greatest at L2-L3 and L3-L4 where it is advanced.   PATIENT SURVEYS:  FOTO 61% function to 66% predicted   SCREENING FOR RED FLAGS: Bowel or bladder incontinence: No Spinal tumors: No Cauda equina syndrome: No Compression fracture: No Abdominal aneurysm: No   COGNITION: Overall cognitive status: Within functional limits for tasks assessed                          SENSATION: Not tested   MUSCLE LENGTH: Hamstrings: moderate tightness bilaterally  Thomas test: not formally tested, but notable tightness when patient lies in supine    POSTURE:  forward flexed posture   PALPATION: Not assessed    LUMBAR ROM:    AROM eval  Flexion WNL  Extension 75% limited   Right lateral flexion 50% limited pn  Left lateral flexion 50% limited   Right rotation WNL  Left rotation WNL   (Blank rows = not tested)   LOWER EXTREMITY ROM:      Active  Right eval Left eval  Hip flexion      Hip extension      Hip abduction      Hip adduction      Hip internal rotation      Hip external rotation      Knee flexion      Knee extension      Ankle dorsiflexion      Ankle plantarflexion      Ankle inversion      Ankle eversion       (Blank rows = not tested)   LOWER EXTREMITY MMT:     MMT Right eval Left eval  Hip flexion 4 4-  Hip extension Unable to lie  prone Unable to lie prone  Hip abduction 4+ 4+  Hip adduction      Hip internal rotation      Hip external rotation      Knee flexion 5 5  Knee extension 5 5  Ankle dorsiflexion      Ankle plantarflexion      Ankle inversion      Ankle eversion       (Blank rows = not tested)   LUMBAR SPECIAL TESTS:  Not tested    FUNCTIONAL TESTS:  5 x STS: 14 seconds  04/24/22: 5 x STS: 12 seconds    6 MWT: 1029ft BORG RPE 12; after 2 minutes he reported low back discomfort and fatigue in lower extremities   05/01/22: 6 MWT 1099 reports fatigue in thighs, but states that it is less compared to initial eval.    GAIT: Distance walked: 6 MWT  Assistive device utilized: None Level of assistance: Complete Independence Comments: forward flexed posture, WBOS [facility] Adult PT Treatment:                                                DATE: 05/08/22 Therapeutic Exercise: NuStep level 5 x 5 minutes UE/LE  Hip bridge 2 x 10  SKTC x 5; 5 sec hold  Sit to stand 2 x 10; 2nd set with 5 lb kettle bell  Fwd step ups 6 inch step 2 x 10  Seated trunk flexion x 10  Lateral step ups 6 inch 2 x 10  Prone quad stretch x 60 sec each  Sidelying hip abduction 2 x 10  Updated HEP   [facility] Adult PT Treatment:                                                DATE: 05/01/22 Therapeutic Exercise: NuStep level 5 x 5 minutes UE/LE  Sit to stand 2 x 10  LAQ 2 x 10; 2# Standing hip abduction 2 x 10;2#  Standing hip extension 2 x 10; 2# Standing HS curl 2 x 10; 2# Step taps 8 inch 2 x 10  6 MWT Updated HEP     [facility] Adult PT Treatment:                                                DATE: 04/24/22 Therapeutic Exercise: NuStep level 5 x 5 minutes UE/LE SKTC x 10 each; 5 sec hold  Hip bridge with abduction green band 2 x 10  SLR 2 x 10  Sidelying hip abduction 2 x 10  LAQ 2# 2 x 10  Sit to stand x 10 Updated HEP     [facility] Adult PT Treatment:                                                DATE: 04/17/22 Therapeutic  Exercise: NuStep level 4 x 5 minutes UE/LE LTR x 1 minute  Supine hip flexor stretch x 1 minute SKTC x 5 each; 5 sec hold  Clamshells 2 x 10  Hip bridge 2 x 10  Open book x 10 each  HS stretch x 30 sec each  Standing hip abduction x 10 each  Standing hip extension x 10 each Standing march x 10 each  Updated HEP     PATIENT EDUCATION:  Education details: see treatment Person educated: Patient Education method: Explanation, Demonstration, Tactile cues, Verbal cues, and Handouts Education comprehension: verbalized understanding, returned demonstration, verbal cues required, tactile cues required, and needs further education   HOME EXERCISE PROGRAM: Access Code: [email or web address] URL: [email or web address]    ASSESSMENT:   CLINICAL IMPRESSION: Patient arrives without reports of pain, but reports ongoing "fatigue/weakness" in anterior thighs with walking activity. Continued emphasis on BLE strengthening with progression of standing activity. Towards end of step ups he reported "tiredness" in his anterior thighs that was relieved with repeated trunk flexion. He reported "a little tiredness" in the anterior thighs at conclusion of session, but no pain.    OBJECTIVE IMPAIRMENTS: Abnormal gait, decreased activity tolerance, decreased endurance, decreased knowledge of condition, difficulty walking, decreased ROM, decreased strength, impaired flexibility, postural dysfunction, and pain.    ACTIVITY LIMITATIONS: standing and locomotion level   PARTICIPATION LIMITATIONS: meal prep, cleaning, laundry, shopping, community activity, and yard work   PERSONAL FACTORS: Age, Fitness, Time since onset of injury/illness/exacerbation, and 1 comorbidity: Chronic lymphocytic leukemia   are also affecting patient's functional outcome.    REHAB POTENTIAL: Good   CLINICAL DECISION MAKING: Evolving/moderate complexity   EVALUATION COMPLEXITY: Moderate     GOALS: Goals reviewed with patient?  Yes   SHORT TERM GOALS: Target date: 05/12/2022       Patient will be independent and compliant with initial HEP.    Baseline: see above Goal status: met   2.  Patient will complete 5 x STS in </= 12 seconds to improve functional strength.  Baseline: see above  Goal status: met   3.  Patient will be able to complete 10 minutes of continuous standing activity with minimal fatigue/discomfort reported.  Baseline: reported back discomfort and tiredness in his legs after 2 minutes of walking.  05/01/22: 12 minutes standing strengthening+ 6 minute walk Goal status: met     LONG TERM GOALS: Target date: 06/13/2022       Patient will walk at least 1,368 ft during 6 MWT to signify an improvement in walking endurance.  Baseline: see above Goal status: INITIAL   2.  Patient will demonstrate 4+/5 bilateral hip flexor strength to improve stability with walking activity.  Baseline: see above Goal status: INITIAL   3.  Patient will be able to complete 30 minutes of continuous standing activity to improve tolerance to completing household activities.  Baseline: see above Goal status: INITIAL   4.  Patient will score at least 66% on FOTO to signify clinically meaningful improvement in functional abilities.    Baseline: see above Goal status: INITIAL   5.  Patient will be independent with advanced home program to progress/maintain current level of function.  Baseline: see above  Goal status: INITIAL     PLAN:   PT FREQUENCY: 1-2x/week   PT DURATION: 8 weeks   PLANNED INTERVENTIONS: Therapeutic exercises, Therapeutic activity, Neuromuscular re-education, Balance training, Gait training, Patient/Family education, Self Care, Dry Needling, Manual therapy, and Re-evaluation.   PLAN FOR NEXT SESSION: review and progress HEP prn; standing LE strengthening.     Letitia Libra, PT, DPT, ATC 05/08/22 3:29 PM

## 2022-05-12 ENCOUNTER — Other Ambulatory Visit: Payer: Self-pay

## 2022-05-12 ENCOUNTER — Ambulatory Visit: Payer: Medicare Other

## 2022-05-12 DIAGNOSIS — M6281 Muscle weakness (generalized): Secondary | ICD-10-CM | POA: Diagnosis not present

## 2022-05-12 DIAGNOSIS — R262 Difficulty in walking, not elsewhere classified: Secondary | ICD-10-CM

## 2022-05-12 DIAGNOSIS — M5459 Other low back pain: Secondary | ICD-10-CM

## 2022-05-12 DIAGNOSIS — C9110 Chronic lymphocytic leukemia of B-cell type not having achieved remission: Secondary | ICD-10-CM

## 2022-05-12 NOTE — Therapy (Signed)
OUTPATIENT PHYSICAL THERAPY TREATMENT NOTE   Patient Name: Brandon Norris MRN: [id number] DOB:[birth year], 89 y.o., male Today's Date: 05/12/2022  PCP: Merri Brunette, MD  REFERRING PROVIDER: Rodolph Bong, MD   END OF SESSION:   PT End of Session - 05/12/22 1544     Visit Number 6    Number of Visits 17    Date for PT Re-Evaluation 06/13/22    Authorization Type MCR    PT Start Time 1544    PT Stop Time 1625    PT Time Calculation (min) 41 min    Activity Tolerance Patient tolerated treatment well    Behavior During Therapy WFL for tasks assessed/performed                Past Medical History:  Diagnosis Date   Actinic keratoses    Allergic rhinitis    Atypical nevi    Diverticulosis    Enlarged prostate    Glaucoma    Glaucoma    H/O degenerative disc disease    Hx of adenomatous colonic polyps    Malignant melanoma of skin of abdomen (HCC) 2016   Rotator cuff tendinitis    Thrombocytopenia (HCC)    Past Surgical History:  Procedure Laterality Date   HERNIA REPAIR     Left inguinal herniorrhaphy     Melanoma on abdomen  2016   Patient Active Problem List   Diagnosis Date Noted   Skin lesion of left lower limb 09/30/2021   Hypercalcemia 09/13/2020   CLL (chronic lymphocytic leukemia) (HCC) 01/21/2016   Elevated WBC count 01/16/2016   Glaucoma 01/16/2016   Thrombocytopenia (HCC) 01/16/2016   Diverticulosis 01/16/2016   Encounter for colonoscopy due to history of adenomatous colonic polyps 01/16/2016   Allergic rhinitis 01/16/2016    REFERRING DIAG:  M54.41,M54.42,G89.29 (ICD-10-CM) - Chronic midline low back pain with bilateral sciatica  R29.898 (ICD-10-CM) - Weakness of both lower extremities     THERAPY DIAG:  Other low back pain  Muscle weakness (generalized)  Difficulty in walking, not elsewhere classified  Rationale for Evaluation and Treatment Rehabilitation  PERTINENT HISTORY: Chronic lymphocytic leukemia   PRECAUTIONS: Fall  and Other: active cancer   SUBJECTIVE:                                                                                                                                                                                      SUBJECTIVE STATEMENT: "Feel pretty good now, but my back was tired from standing today." He does feel that the fatigue/tiredness in his thighs has improved since beginning PT, but now feels more of the weakness isolated to his knees.  PAIN:  Are you having pain? No   OBJECTIVE: (objective measures completed at initial evaluation unless otherwise dated)  DIAGNOSTIC FINDINGS:  Lumbar X-ray: IMPRESSION: Multilevel spondylosis, disc space height loss, and degenerative endplate changes greatest at L2-L3 and L3-L4 where it is advanced.   PATIENT SURVEYS:  FOTO 61% function to 66% predicted 05/12/22: 69% function    SCREENING FOR RED FLAGS: Bowel or bladder incontinence: No Spinal tumors: No Cauda equina syndrome: No Compression fracture: No Abdominal aneurysm: No   COGNITION: Overall cognitive status: Within functional limits for tasks assessed                          SENSATION: Not tested   MUSCLE LENGTH: Hamstrings: moderate tightness bilaterally  Thomas test: not formally tested, but notable tightness when patient lies in supine    POSTURE:  forward flexed posture   PALPATION: Not assessed    LUMBAR ROM:    AROM eval  Flexion WNL  Extension 75% limited   Right lateral flexion 50% limited pn  Left lateral flexion 50% limited   Right rotation WNL  Left rotation WNL   (Blank rows = not tested)   LOWER EXTREMITY ROM:      Active  Right eval Left eval  Hip flexion      Hip extension      Hip abduction      Hip adduction      Hip internal rotation      Hip external rotation      Knee flexion      Knee extension      Ankle dorsiflexion      Ankle plantarflexion      Ankle inversion      Ankle eversion       (Blank rows = not tested)    LOWER EXTREMITY MMT:     MMT Right eval Left eval  Hip flexion 4 4-  Hip extension Unable to lie prone Unable to lie prone  Hip abduction 4+ 4+  Hip adduction      Hip internal rotation      Hip external rotation      Knee flexion 5 5  Knee extension 5 5  Ankle dorsiflexion      Ankle plantarflexion      Ankle inversion      Ankle eversion       (Blank rows = not tested)   LUMBAR SPECIAL TESTS:  Not tested    FUNCTIONAL TESTS:  5 x STS: 14 seconds  04/24/22: 5 x STS: 12 seconds    6 MWT: 1047ft BORG RPE 12; after 2 minutes he reported low back discomfort and fatigue in lower extremities   05/01/22: 6 MWT 1099 reports fatigue in thighs, but states that it is less compared to initial eval.    GAIT: Distance walked: 6 MWT  Assistive device utilized: None Level of assistance: Complete Independence Comments: forward flexed posture, WBOS [facility] Adult PT Treatment:                                                DATE: 05/12/22 Therapeutic Exercise: NuStep level 6 x 5 minutes UE/LE Resisted knee extension 3 x 10 @ 15 lbs  Resisted hamstring curls 3 x 10 @ 20 lbs  Resisted hip flexion cybex 3 x 10 @  25 lbs  Leg press 3 x 10 @ 40 lbs     [facility] Adult PT Treatment:                                                DATE: 05/08/22 Therapeutic Exercise: NuStep level 5 x 5 minutes UE/LE  Hip bridge 2 x 10  SKTC x 5; 5 sec hold  Sit to stand 2 x 10; 2nd set with 5 lb kettle bell  Fwd step ups 6 inch step 2 x 10  Seated trunk flexion x 10  Lateral step ups 6 inch 2 x 10  Prone quad stretch x 60 sec each  Sidelying hip abduction 2 x 10  Updated HEP   [facility] Adult PT Treatment:                                                DATE: 05/01/22 Therapeutic Exercise: NuStep level 5 x 5 minutes UE/LE  Sit to stand 2 x 10  LAQ 2 x 10; 2# Standing hip abduction 2 x 10;2#  Standing hip extension 2 x 10; 2# Standing HS curl 2 x 10; 2# Step taps 8 inch 2 x 10  6 MWT Updated HEP      PATIENT  EDUCATION:  Education details: HEP review Person educated: Patient Education method: Explanation Education comprehension: verbalized understanding   HOME EXERCISE PROGRAM: Access Code: [email or web address] URL: [email or web address]    ASSESSMENT:   CLINICAL IMPRESSION: Patient tolerated session well today with continued progression of hip/knee strengthening. Utilized machines today for strengthening with patient demonstrating good control/form with moderate loading. His FOTO score has improved compared to baseline, having met this long term functional goal. No reports of pain/fatigue throughout session.    OBJECTIVE IMPAIRMENTS: Abnormal gait, decreased activity tolerance, decreased endurance, decreased knowledge of condition, difficulty walking, decreased ROM, decreased strength, impaired flexibility, postural dysfunction, and pain.    ACTIVITY LIMITATIONS: standing and locomotion level   PARTICIPATION LIMITATIONS: meal prep, cleaning, laundry, shopping, community activity, and yard work   PERSONAL FACTORS: Age, Fitness, Time since onset of injury/illness/exacerbation, and 1 comorbidity: Chronic lymphocytic leukemia   are also affecting patient's functional outcome.    REHAB POTENTIAL: Good   CLINICAL DECISION MAKING: Evolving/moderate complexity   EVALUATION COMPLEXITY: Moderate     GOALS: Goals reviewed with patient? Yes   SHORT TERM GOALS: Target date: 05/12/2022       Patient will be independent and compliant with initial HEP.    Baseline: see above Goal status: met   2.  Patient will complete 5 x STS in </= 12 seconds to improve functional strength.  Baseline: see above  Goal status: met   3.  Patient will be able to complete 10 minutes of continuous standing activity with minimal fatigue/discomfort reported.  Baseline: reported back discomfort and tiredness in his legs after 2 minutes of walking.  05/01/22: 12 minutes standing strengthening+ 6 minute walk Goal  status: met     LONG TERM GOALS: Target date: 06/13/2022       Patient will walk at least 1,368 ft during 6 MWT to signify an improvement in walking endurance.  Baseline: see above Goal status: INITIAL   2.  Patient will demonstrate  4+/5 bilateral hip flexor strength to improve stability with walking activity.  Baseline: see above Goal status: INITIAL   3.  Patient will be able to complete 30 minutes of continuous standing activity to improve tolerance to completing household activities.  Baseline: see above Goal status: INITIAL   4.  Patient will score at least 66% on FOTO to signify clinically meaningful improvement in functional abilities.    Baseline: see above Goal status: met   5.  Patient will be independent with advanced home program to progress/maintain current level of function.  Baseline: see above  Goal status: INITIAL     PLAN:   PT FREQUENCY: 1-2x/week   PT DURATION: 8 weeks   PLANNED INTERVENTIONS: Therapeutic exercises, Therapeutic activity, Neuromuscular re-education, Balance training, Gait training, Patient/Family education, Self Care, Dry Needling, Manual therapy, and Re-evaluation.   PLAN FOR NEXT SESSION: review and progress HEP prn; standing LE strengthening.     Letitia Libra, PT, DPT, ATC 05/12/22 4:31 PM

## 2022-05-13 ENCOUNTER — Inpatient Hospital Stay: Payer: Medicare Other | Attending: Hematology

## 2022-05-13 ENCOUNTER — Inpatient Hospital Stay (HOSPITAL_BASED_OUTPATIENT_CLINIC_OR_DEPARTMENT_OTHER): Payer: Medicare Other | Admitting: Hematology

## 2022-05-13 VITALS — BP 112/55 | HR 70 | Temp 97.7°F | Resp 16 | Wt 144.9 lb

## 2022-05-13 DIAGNOSIS — C9110 Chronic lymphocytic leukemia of B-cell type not having achieved remission: Secondary | ICD-10-CM

## 2022-05-13 DIAGNOSIS — D539 Nutritional anemia, unspecified: Secondary | ICD-10-CM | POA: Insufficient documentation

## 2022-05-13 DIAGNOSIS — D696 Thrombocytopenia, unspecified: Secondary | ICD-10-CM | POA: Diagnosis not present

## 2022-05-13 LAB — CBC WITH DIFFERENTIAL (CANCER CENTER ONLY)
Abs Immature Granulocytes: 0.06 10*3/uL (ref 0.00–0.07)
Basophils Absolute: 0.1 10*3/uL (ref 0.0–0.1)
Basophils Relative: 0 %
Eosinophils Absolute: 0.3 10*3/uL (ref 0.0–0.5)
Eosinophils Relative: 1 %
HCT: 35.1 % — ABNORMAL LOW (ref 39.0–52.0)
Hemoglobin: 11.4 g/dL — ABNORMAL LOW (ref 13.0–17.0)
Immature Granulocytes: 0 %
Lymphocytes Relative: 82 %
Lymphs Abs: 20.7 10*3/uL — ABNORMAL HIGH (ref 0.7–4.0)
MCH: 32.3 pg (ref 26.0–34.0)
MCHC: 32.5 g/dL (ref 30.0–36.0)
MCV: 99.4 fL (ref 80.0–100.0)
Monocytes Absolute: 2.1 10*3/uL — ABNORMAL HIGH (ref 0.1–1.0)
Monocytes Relative: 8 %
Neutro Abs: 2.1 10*3/uL (ref 1.7–7.7)
Neutrophils Relative %: 9 %
Platelet Count: 105 10*3/uL — ABNORMAL LOW (ref 150–400)
RBC: 3.53 MIL/uL — ABNORMAL LOW (ref 4.22–5.81)
RDW: 15.4 % (ref 11.5–15.5)
Smear Review: NORMAL
WBC Count: 25.3 10*3/uL — ABNORMAL HIGH (ref 4.0–10.5)
nRBC: 0 % (ref 0.0–0.2)

## 2022-05-13 LAB — CMP (CANCER CENTER ONLY)
ALT: 11 U/L (ref 0–44)
AST: 14 U/L — ABNORMAL LOW (ref 15–41)
Albumin: 4.3 g/dL (ref 3.5–5.0)
Alkaline Phosphatase: 73 U/L (ref 38–126)
Anion gap: 6 (ref 5–15)
BUN: 25 mg/dL — ABNORMAL HIGH (ref 8–23)
CO2: 31 mmol/L (ref 22–32)
Calcium: 9.3 mg/dL (ref 8.9–10.3)
Chloride: 105 mmol/L (ref 98–111)
Creatinine: 1.24 mg/dL (ref 0.61–1.24)
GFR, Estimated: 56 mL/min — ABNORMAL LOW (ref >60)
Glucose, Bld: 130 mg/dL — ABNORMAL HIGH (ref 70–99)
Potassium: 4.2 mmol/L (ref 3.5–5.1)
Sodium: 142 mmol/L (ref 135–145)
Total Bilirubin: 0.5 mg/dL (ref 0.3–1.2)
Total Protein: 7.1 g/dL (ref 6.5–8.1)

## 2022-05-13 LAB — RETIC PANEL
Immature Retic Fract: 10 % (ref 2.3–15.9)
RBC.: 3.57 MIL/uL — ABNORMAL LOW (ref 4.22–5.81)
Retic Count, Absolute: 62.5 10*3/uL (ref 19.0–186.0)
Retic Ct Pct: 1.8 % (ref 0.4–3.1)
Reticulocyte Hemoglobin: 34.4 pg (ref >27.9)

## 2022-05-13 LAB — FERRITIN: Ferritin: 55 ng/mL (ref 24–336)

## 2022-05-13 LAB — LACTATE DEHYDROGENASE: LDH: 178 U/L (ref 98–192)

## 2022-05-13 NOTE — Progress Notes (Signed)
[name]  HEMATOLOGY ONCOLOGY CLINIC NOTE  Date of service: 05/13/22     Patient Care Team: Merri Brunette, MD as PCP - General (Internal Medicine)  Chief Complaint:  Follow-up for continued evaluation and management of CLL  Diagnosis: CLL  Current Treatment: Monitoring  INTERVAL HISTORY:  Brandon Norris is a 89 y.o. male here for continued evaluation and management of his CLL.   Patient was last seen by me on 01/10/2022 and he complained of continuous right leg weakness, lower back pain, and intermittent wobbly knees.   Patient reports he has been doing well overall without any new or severe problems since our last visit. He does complain of bilateral leg weakness and knee problems. However, patient has been going to physical therapy which has been improving his strength.   He denies fever, chills, night sweats, unexpected weight loss, new lumps/bumps, abdominal pain, back pain, chest pain, or leg swelling.    REVIEW OF SYSTEMS:   .10 Point review of Systems was done is negative except as noted above.    Past Medical History:  Diagnosis Date   Actinic keratoses    Allergic rhinitis    Atypical nevi    Diverticulosis    Enlarged prostate    Glaucoma    Glaucoma    H/O degenerative disc disease    Hx of adenomatous colonic polyps    Malignant melanoma of skin of abdomen (HCC) 2016   Rotator cuff tendinitis    Thrombocytopenia (HCC)     . Past Surgical History:  Procedure Laterality Date   HERNIA REPAIR     Left inguinal herniorrhaphy     Melanoma on abdomen  2016     Social History   Tobacco Use   Smoking status: Never   Smokeless tobacco: Never  Vaping Use   Vaping Use: Never used  Substance Use Topics   Alcohol use: Yes    Alcohol/week: 3.0 standard drinks of alcohol    Types: 3 Glasses of wine per week   Drug use: No    ALLERGIES:  has No Known Allergies.  MEDICATIONS:  Current Outpatient Medications  Medication Sig Dispense Refill   b  complex vitamins capsule Take 1 capsule by mouth daily.     Cholecalciferol (VITAMIN D3) 50 MCG (2000 UT) TABS Take 1 tablet by mouth daily at 2 PM.     dorzolamide-timolol (COSOPT) 22.3-6.8 MG/ML ophthalmic solution Place 1 drop into both eyes 2 (two) times daily.     latanoprost (XALATAN) 0.005 % ophthalmic solution 1 drop into BOTH  eye in the evening     Multiple Vitamin (MULTIVITAMINS PO)      vitamin B-12 (CYANOCOBALAMIN) 250 MCG tablet Take 250 mcg by mouth daily.     No current facility-administered medications for this visit.    PHYSICAL EXAMINATION: .BP (!) 126/57 (BP Location: Left Arm, Patient Position: Sitting) Comment: nurse notfied  Pulse 70   Temp 97.9 F (36.6 C) (Oral)   Resp 17   Wt 149 lb 6.4 oz (67.8 kg)   SpO2 99%   BMI 22.06 kg/m  NAD GENERAL:alert, in no acute distress and comfortable SKIN: no acute rashes. Ulcerated 3 cm x 3 cm lesion on the medial aspect of upper left leg. EYES: conjunctiva are pink and non-injected, sclera anicteric NECK: supple, no JVD LYMPH:  no palpable lymphadenopathy in the cervical, axillary or inguinal regions LUNGS: clear to auscultation b/l with normal respiratory effort HEART: regular rate & rhythm ABDOMEN:  normoactive bowel sounds ,  non tender, not distended. Extremity: no pedal edema PSYCH: alert & oriented x 3 with fluent speech NEURO: no focal motor/sensory deficits  LABORATORY DATA:   I have reviewed the data as listed  .    Latest Ref Rng & Units 01/10/2022    9:13 AM 09/03/2021    1:30 PM 04/30/2021    2:48 PM  CBC  WBC 4.0 - 10.5 K/uL 22.8  22.3  23.8   Hemoglobin 13.0 - 17.0 g/dL 78.4  69.6  29.5   Hematocrit 39.0 - 52.0 % 33.6  32.9  32.5   Platelets 150 - 400 K/uL 121  130  102     .    Latest Ref Rng & Units 09/03/2021    1:30 PM 04/30/2021    2:48 PM 01/28/2021    9:17 AM  CMP  Glucose 70 - 99 mg/dL 284  132  77   BUN 8 - 23 mg/dL 25  22  25    Creatinine 0.61 - 1.24 mg/dL 4.40  1.02  7.25   Sodium  135 - 145 mmol/L 141  142  142   Potassium 3.5 - 5.1 mmol/L 4.1  4.2  4.4   Chloride 98 - 111 mmol/L 106  108  108   CO2 22 - 32 mmol/L 33  31  29   Calcium 8.9 - 10.3 mg/dL 9.6  9.3  8.8   Total Protein 6.5 - 8.1 g/dL 6.7  6.8  7.0   Total Bilirubin 0.3 - 1.2 mg/dL 0.4  0.4  0.4   Alkaline Phos 38 - 126 U/L 65  58  45   AST 15 - 41 U/L 16  13  18    ALT 0 - 44 U/L 14  11  18      [place] ABD (01/28/2016)  ABDOMEN ULTRASOUND COMPLETE   COMPARISON:  None.   FINDINGS: Gallbladder: No gallstones or wall thickening visualized. There is no pericholecystic fluid. No sonographic Murphy sign noted by sonographer.   Common bile duct: Diameter: 2 mm. There is no intrahepatic, common hepatic, or common bile duct dilatation.   Liver: No focal lesion identified. Within normal limits in parenchymal echogenicity.   IVC: No abnormality visualized.   Pancreas: Visualized portion unremarkable. Portions of pancreas obscured by gas.   Spleen: Spleen measures 16.0 x 14.2 x 5.8 cm with a measures splenic volume of 621 cubic cm. No focal splenic lesions are evident.   Right Kidney: Length: 11.4 cm. Echogenicity within normal limits. No hydronephrosis visualized. There is a parapelvic cyst in the left kidney measuring 5.1 x 3.5 x 6.3 cm.   Left Kidney: Length: 11.1 cm. Echogenicity within normal limits. No mass or hydronephrosis visualized.   Abdominal aorta: No aneurysm visualized.   Other findings: No demonstrable ascites.   IMPRESSION: Splenomegaly.  No focal splenic lesions evident.   Parapelvic cyst right kidney measuring 5.1 x 3.5 x 6.3 cm.   Portions of pancreas obscured by gas. Visualized portions of pancreas appear normal.   Study otherwise unremarkable.     Electronically Signed   By: Bretta Bang III M.D.   On: 01/28/2016 14:09  FISH, CLL Prognostic on 08/26/16   RADIOGRAPHIC STUDIES: I have personally reviewed the radiological images as listed and agreed with the  findings in the report. No results found.  ASSESSMENT & PLAN:   89 y.o. male with  1) Rai Stage 2 Chronic lymphocytic leukemia. (with splenomegaly) Trisomy 12 and a 13q deletion.  Would be stage II  if splenomegaly was considered to be related to the CLL. Patient however notes that he has had splenomegaly for decades without clear etiology noted previously.  WBC counts are not changed significantly. No anemia Mild thrombocytopenia that is not significantly changed platelets around 101k.  No constitutional symptoms. No issues with infections/bleeding. No clinically palpable lymphadenopathy   2) status post anorexia with weight loss.  Patient is now eating much better and has gained 7 pounds in the last 2 to 3 months.  Resolved with steroids  3) previous hypercalcemia and elevated creatinine which had resolved.  Resolved with steroids  4) macrocytic anemia B12 borderline low at 281 up from 233. Has been taking B complex 1 capsule daily Would recommend taking additional B12 1000 mcg daily over-the-counter.   5) Incidentally noted left parapelvic right kidney cyst measuring 5.1 x 3.5 x 6.3 cm in size. Ct abd- 9/13-- simple rt renal cyst noted. No concerning renal lesions.   6) . Patient Active Problem List   Diagnosis Date Noted   Hypercalcemia 09/13/2020   CLL (chronic lymphocytic leukemia) (HCC) 01/21/2016   Elevated WBC count 01/16/2016   Glaucoma 01/16/2016   Thrombocytopenia (HCC) 01/16/2016   Diverticulosis 01/16/2016   Encounter for colonoscopy due to history of adenomatous colonic polyps 01/16/2016   Allergic rhinitis 01/16/2016  -f/u with PCP for mx  PLAN: -Discussed lab results from today, 05/13/2022, with the patient. CBC shows elevated WBC at 25.3 K, slightly decreased hemoglobin of 11.4, decreased hematocrit at 35.1, and decreased platelets at 105 K. CMP shows elevated glucose level at 130, elevated BUN at 25, and slightly decreased AST at 14. Overall, patient's  labs are stable. Patient is slightly dehydrated.  -Patient has lab or clinical symptoms suggestive of significant CLL progression at this time to warrant initiating treatment. -Patient continues to want to take a conservative approach from a CLL treatment standpoint.  -RTC with Dr. Candise Che with labs in 6 months.    FOLLOW-UP: RTC with Dr Candise Che with labs in 6 months  The total time spent in the appointment was 20 minutes* .  All of the patient's questions were answered with apparent satisfaction. The patient knows to call the clinic with any problems, questions or concerns.   Wyvonnia Lora MD MS AAHIVMS [facility] Hematology/Oncology Physician [facility]  .*Total Encounter Time as defined by the Centers for Medicare and Medicaid Services includes, in addition to the face-to-face time of a patient visit (documented in the note above) non-face-to-face time: obtaining and reviewing outside history, ordering and reviewing medications, tests or procedures, care coordination (communications with other health care professionals or caregivers) and documentation in the medical record.   I, Ok Edwards, am acting as a Neurosurgeon for Wyvonnia Lora, MD. .I have reviewed the above documentation for accuracy and completeness, and I agree with the above. [name] KitchenJohney Maine MD

## 2022-05-19 ENCOUNTER — Encounter: Payer: Self-pay | Admitting: Hematology

## 2022-05-19 ENCOUNTER — Encounter: Payer: Self-pay | Admitting: Family Medicine

## 2022-05-19 ENCOUNTER — Ambulatory Visit: Payer: Medicare Other | Admitting: Family Medicine

## 2022-05-19 VITALS — BP 122/64 | HR 70 | Ht 69.0 in | Wt 147.2 lb

## 2022-05-19 DIAGNOSIS — G8929 Other chronic pain: Secondary | ICD-10-CM

## 2022-05-19 DIAGNOSIS — M5441 Lumbago with sciatica, right side: Secondary | ICD-10-CM | POA: Diagnosis not present

## 2022-05-19 DIAGNOSIS — M5442 Lumbago with sciatica, left side: Secondary | ICD-10-CM | POA: Diagnosis not present

## 2022-05-19 NOTE — Progress Notes (Signed)
   I, Stevenson Clinch, CMA acting as a scribe for [name], MD.  Brandon Norris is a 89 y.o. male who presents to [facility] today for 6-wk f/u LBP w/ bilat leg weakness. Pt was last seen by Dr. Denyse Amass on 04/07/22 and was referred to PT, completing 6 visits. Today, pt reports that he is enjoying PT but doesn't know how effective it is, sometimes it seems helpful, other times it does not. He hasn't exerted himself much today, today is a better day. Sometimes the weakness is at the thighs, sometimes at the knees. Feels more like fatigue than pain. Sx present with first morning steps at times. Thinks that HEP may be causing back pain to flare up. Lower back will feel fatigued with prolonged time standing.   Dx imaging: 04/07/22 L-spine XR  Pertinent review of systems: No fevers or chills  Relevant historical information: CLL   Exam:  BP 122/64   Pulse 70   Ht 5\' 9"  (1.753 m)   Wt 147 lb 3.2 oz (66.8 kg)   SpO2 100%   BMI 21.74 kg/m  General: Well Developed, well nourished, and in no acute distress.   MSK: L-spine normal.  Decreased lumbar motion pain with extension. Normal gait    Lab and Radiology Results  EXAM: LUMBAR SPINE - 2-3 VIEW   COMPARISON:  CT chest abdomen and pelvis 09/18/2020   FINDINGS: Slight left convex lumbar curve. No definite acute fracture or traumatic malalignment. Multilevel spondylosis, disc space height loss, and degenerative endplate changes greatest at L2-L3 and L3-L4 where it is advanced. Moderate-advanced lower lumbar facet arthropathy.   IMPRESSION: Multilevel spondylosis, disc space height loss, and degenerative endplate changes greatest at L2-L3 and L3-L4 where it is advanced.     Electronically Signed   By: Minerva Fester M.D.   On: 04/08/2022 22:42   I, [name], personally (independently) visualized and performed the interpretation of the images attached in this note.      Assessment and  Plan: 89 y.o. male with chronic low back pain with pain radiating to the anterior thighs bilaterally thought to be L3 lumbar radiculopathy..  Concern for spinal stenosis.  He has had a good trial of physical therapy which was inadequate.  Plan for MRI to further understand the source of pain and for potential injection planning.  He is willing to consider injections if needed but would like to talk about the results of the MRI first.  Check back with me following MRI.   PDMP not reviewed this encounter. Orders Placed This Encounter  Procedures   MR Lumbar Spine Wo Contrast    Standing Status:   Future    Standing Expiration Date:   05/19/2023    Order Specific Question:   What is the patient's sedation requirement?    Answer:   No Sedation    Order Specific Question:   Does the patient have a pacemaker or implanted devices?    Answer:   No    Order Specific Question:   Preferred imaging location?    Answer:   [facility] (table limit-550lbs)   No orders of the defined types were placed in this encounter.    Discussed warning signs or symptoms. Please see discharge instructions. Patient expresses understanding.   The above documentation has been reviewed and is accurate and complete [name], M.D.

## 2022-05-19 NOTE — Patient Instructions (Signed)
Thank you for coming in today.   You should hear from MRI scheduling within 1 week. If you do not hear please let me know.    Recheck after the MRI.   We could do injections if needed.

## 2022-05-22 ENCOUNTER — Ambulatory Visit: Payer: Medicare Other

## 2022-05-22 DIAGNOSIS — R262 Difficulty in walking, not elsewhere classified: Secondary | ICD-10-CM | POA: Diagnosis not present

## 2022-05-22 DIAGNOSIS — M5459 Other low back pain: Secondary | ICD-10-CM

## 2022-05-22 DIAGNOSIS — M6281 Muscle weakness (generalized): Secondary | ICD-10-CM

## 2022-05-22 NOTE — Therapy (Signed)
OUTPATIENT PHYSICAL THERAPY TREATMENT NOTE   Patient Name: Brandon Norris MRN: [id number] DOB:July 07, 1930, 89 y.o., male Today's Date: 05/22/2022  PCP: Merri Brunette, MD  REFERRING PROVIDER: Rodolph Bong, MD   END OF SESSION:   PT End of Session - 05/22/22 1531     Visit Number 7    Number of Visits 17    Date for PT Re-Evaluation 06/13/22    Authorization Type MCR    PT Start Time 1531    PT Stop Time 1611    PT Time Calculation (min) 40 min    Activity Tolerance Patient tolerated treatment well    Behavior During Therapy WFL for tasks assessed/performed                 Past Medical History:  Diagnosis Date   Actinic keratoses    Allergic rhinitis    Atypical nevi    Diverticulosis    Enlarged prostate    Glaucoma    Glaucoma    H/O degenerative disc disease    Hx of adenomatous colonic polyps    Malignant melanoma of skin of abdomen (HCC) 2016   Rotator cuff tendinitis    Thrombocytopenia (HCC)    Past Surgical History:  Procedure Laterality Date   HERNIA REPAIR     Left inguinal herniorrhaphy     Melanoma on abdomen  2016   Patient Active Problem List   Diagnosis Date Noted   Skin lesion of left lower limb 09/30/2021   Hypercalcemia 09/13/2020   CLL (chronic lymphocytic leukemia) (HCC) 01/21/2016   Elevated WBC count 01/16/2016   Glaucoma 01/16/2016   Thrombocytopenia (HCC) 01/16/2016   Diverticulosis 01/16/2016   Encounter for colonoscopy due to history of adenomatous colonic polyps 01/16/2016   Allergic rhinitis 01/16/2016    REFERRING DIAG:  M54.41,M54.42,G89.29 (ICD-10-CM) - Chronic midline low back pain with bilateral sciatica  R29.898 (ICD-10-CM) - Weakness of both lower extremities     THERAPY DIAG:  Other low back pain  Muscle weakness (generalized)  Difficulty in walking, not elsewhere classified  Rationale for Evaluation and Treatment Rehabilitation  PERTINENT HISTORY: Chronic lymphocytic leukemia   PRECAUTIONS:  Fall and Other: active cancer   SUBJECTIVE:                                                                                                                                                                                      SUBJECTIVE STATEMENT: "I can tell a difference in my thighs, but I can feel it in my knees. I don't think I am as fatigued as I was." He had f/u with Sports Medicine and is set to undergo an MRI.  PAIN:  Are you having pain? No   OBJECTIVE: (objective measures completed at initial evaluation unless otherwise dated)  DIAGNOSTIC FINDINGS:  Lumbar X-ray: IMPRESSION: Multilevel spondylosis, disc space height loss, and degenerative endplate changes greatest at L2-L3 and L3-L4 where it is advanced.   PATIENT SURVEYS:  FOTO 61% function to 66% predicted 05/12/22: 69% function    SCREENING FOR RED FLAGS: Bowel or bladder incontinence: No Spinal tumors: No Cauda equina syndrome: No Compression fracture: No Abdominal aneurysm: No   COGNITION: Overall cognitive status: Within functional limits for tasks assessed                          SENSATION: Not tested   MUSCLE LENGTH: Hamstrings: moderate tightness bilaterally  Thomas test: not formally tested, but notable tightness when patient lies in supine    POSTURE:  forward flexed posture   PALPATION: Not assessed    LUMBAR ROM:    AROM eval  Flexion WNL  Extension 75% limited   Right lateral flexion 50% limited pn  Left lateral flexion 50% limited   Right rotation WNL  Left rotation WNL   (Blank rows = not tested)   LOWER EXTREMITY ROM:      Active  Right eval Left eval  Hip flexion      Hip extension      Hip abduction      Hip adduction      Hip internal rotation      Hip external rotation      Knee flexion      Knee extension      Ankle dorsiflexion      Ankle plantarflexion      Ankle inversion      Ankle eversion       (Blank rows = not tested)   LOWER EXTREMITY MMT:     MMT  Right eval Left eval  Hip flexion 4 4-  Hip extension Unable to lie prone Unable to lie prone  Hip abduction 4+ 4+  Hip adduction      Hip internal rotation      Hip external rotation      Knee flexion 5 5  Knee extension 5 5  Ankle dorsiflexion      Ankle plantarflexion      Ankle inversion      Ankle eversion       (Blank rows = not tested)   LUMBAR SPECIAL TESTS:  Not tested    FUNCTIONAL TESTS:  5 x STS: 14 seconds  04/24/22: 5 x STS: 12 seconds    6 MWT: 1052ft BORG RPE 12; after 2 minutes he reported low back discomfort and fatigue in lower extremities   05/01/22: 6 MWT 1099 reports fatigue in thighs, but states that it is less compared to initial eval.   05/22/22: 6 MWT: 1189 ft      GAIT: Distance walked: 6 MWT  Assistive device utilized: None Level of assistance: Complete Independence Comments: forward flexed posture, WBOS [facility] Adult PT Treatment:                                                DATE: 05/22/22 Therapeutic Exercise: 6 minute walk test Standing trunk flexion 1 x 10  Leg press 3 x 10 @ 40 lbs  Step ups 8 inch 2 x  10  Cybex hip abduction 2 x 10 @ 25 lbs Updated HEP    [facility] Adult PT Treatment:                                                DATE: 05/12/22 Therapeutic Exercise: NuStep level 6 x 5 minutes UE/LE Resisted knee extension 3 x 10 @ 15 lbs  Resisted hamstring curls 3 x 10 @ 20 lbs  Resisted hip flexion cybex 3 x 10 @ 25 lbs  Leg press 3 x 10 @ 40 lbs     [facility] Adult PT Treatment:                                                DATE: 05/08/22 Therapeutic Exercise: NuStep level 5 x 5 minutes UE/LE  Hip bridge 2 x 10  SKTC x 5; 5 sec hold  Sit to stand 2 x 10; 2nd set with 5 lb kettle bell  Fwd step ups 6 inch step 2 x 10  Seated trunk flexion x 10  Lateral step ups 6 inch 2 x 10  Prone quad stretch x 60 sec each  Sidelying hip abduction 2 x 10  Updated HEP   [facility] Adult PT Treatment:                                                 DATE: 05/01/22 Therapeutic Exercise: NuStep level 5 x 5 minutes UE/LE  Sit to stand 2 x 10  LAQ 2 x 10; 2# Standing hip abduction 2 x 10;2#  Standing hip extension 2 x 10; 2# Standing HS curl 2 x 10; 2# Step taps 8 inch 2 x 10  6 MWT Updated HEP      PATIENT EDUCATION:  Education details: HEP update Person educated: Patient Education method: Explanation,demo, cues, handout Education comprehension: verbalized understanding, returned demo, cues    HOME EXERCISE PROGRAM: Access Code: [email or web address] URL: [email or web address]    ASSESSMENT:   CLINICAL IMPRESSION: Patient tolerated session well today with continued focus on hip/knee strengthening. 6 MWT was performed with patient reporting onset of thigh fatigue after 2 minutes. At the end of the 6 MWT he completed repeated standing trunk flexion and reported a significant improvement in his thigh fatigue. HEP was updated to include flexion biased movement with patient encouraged to complete every 2 hours or as needed for pain control with patient verbalizing understanding. No reports of pain throughout session.    OBJECTIVE IMPAIRMENTS: Abnormal gait, decreased activity tolerance, decreased endurance, decreased knowledge of condition, difficulty walking, decreased ROM, decreased strength, impaired flexibility, postural dysfunction, and pain.    ACTIVITY LIMITATIONS: standing and locomotion level   PARTICIPATION LIMITATIONS: meal prep, cleaning, laundry, shopping, community activity, and yard work   PERSONAL FACTORS: Age, Fitness, Time since onset of injury/illness/exacerbation, and 1 comorbidity: Chronic lymphocytic leukemia   are also affecting patient's functional outcome.    REHAB POTENTIAL: Good   CLINICAL DECISION MAKING: Evolving/moderate complexity   EVALUATION COMPLEXITY: Moderate     GOALS: Goals reviewed with patient? Yes   SHORT TERM GOALS: Target date:  05/12/2022       Patient will be independent and  compliant with initial HEP.    Baseline: see above Goal status: met   2.  Patient will complete 5 x STS in </= 12 seconds to improve functional strength.  Baseline: see above  Goal status: met   3.  Patient will be able to complete 10 minutes of continuous standing activity with minimal fatigue/discomfort reported.  Baseline: reported back discomfort and tiredness in his legs after 2 minutes of walking.  05/01/22: 12 minutes standing strengthening+ 6 minute walk Goal status: met     LONG TERM GOALS: Target date: 06/13/2022       Patient will walk at least 1,368 ft during 6 MWT to signify an improvement in walking endurance.  Baseline: see above Goal status: INITIAL   2.  Patient will demonstrate 4+/5 bilateral hip flexor strength to improve stability with walking activity.  Baseline: see above Goal status: INITIAL   3.  Patient will be able to complete 30 minutes of continuous standing activity to improve tolerance to completing household activities.  Baseline: see above Goal status: INITIAL   4.  Patient will score at least 66% on FOTO to signify clinically meaningful improvement in functional abilities.    Baseline: see above Goal status: met   5.  Patient will be independent with advanced home program to progress/maintain current level of function.  Baseline: see above  Goal status: INITIAL     PLAN:   PT FREQUENCY: 1-2x/week   PT DURATION: 8 weeks   PLANNED INTERVENTIONS: Therapeutic exercises, Therapeutic activity, Neuromuscular re-education, Balance training, Gait training, Patient/Family education, Self Care, Dry Needling, Manual therapy, and Re-evaluation.   PLAN FOR NEXT SESSION: review and progress HEP prn; standing LE strengthening.     Letitia Libra, PT, DPT, ATC 05/22/22 4:12 PM

## 2022-05-29 ENCOUNTER — Ambulatory Visit: Payer: Medicare Other

## 2022-05-29 DIAGNOSIS — M6281 Muscle weakness (generalized): Secondary | ICD-10-CM | POA: Diagnosis not present

## 2022-05-29 DIAGNOSIS — R262 Difficulty in walking, not elsewhere classified: Secondary | ICD-10-CM

## 2022-05-29 DIAGNOSIS — M5459 Other low back pain: Secondary | ICD-10-CM

## 2022-05-29 NOTE — Therapy (Signed)
OUTPATIENT PHYSICAL THERAPY TREATMENT NOTE   Patient Name: Brandon Norris MRN: [id number] DOB:31-Jan-1930, 89 y.o., male Today's Date: 05/29/2022  PCP: Merri Brunette, MD  REFERRING PROVIDER: Rodolph Bong, MD   END OF SESSION:   PT End of Session - 05/29/22 1531     Visit Number 8    Number of Visits 17    Date for PT Re-Evaluation 06/13/22    Authorization Type MCR    PT Start Time 1532    PT Stop Time 1613    PT Time Calculation (min) 41 min    Activity Tolerance Patient tolerated treatment well    Behavior During Therapy WFL for tasks assessed/performed                  Past Medical History:  Diagnosis Date   Actinic keratoses    Allergic rhinitis    Atypical nevi    Diverticulosis    Enlarged prostate    Glaucoma    Glaucoma    H/O degenerative disc disease    Hx of adenomatous colonic polyps    Malignant melanoma of skin of abdomen (HCC) 2016   Rotator cuff tendinitis    Thrombocytopenia (HCC)    Past Surgical History:  Procedure Laterality Date   HERNIA REPAIR     Left inguinal herniorrhaphy     Melanoma on abdomen  2016   Patient Active Problem List   Diagnosis Date Noted   Skin lesion of left lower limb 09/30/2021   Hypercalcemia 09/13/2020   CLL (chronic lymphocytic leukemia) (HCC) 01/21/2016   Elevated WBC count 01/16/2016   Glaucoma 01/16/2016   Thrombocytopenia (HCC) 01/16/2016   Diverticulosis 01/16/2016   Encounter for colonoscopy due to history of adenomatous colonic polyps 01/16/2016   Allergic rhinitis 01/16/2016    REFERRING DIAG:  M54.41,M54.42,G89.29 (ICD-10-CM) - Chronic midline low back pain with bilateral sciatica  R29.898 (ICD-10-CM) - Weakness of both lower extremities     THERAPY DIAG:  Other low back pain  Muscle weakness (generalized)  Difficulty in walking, not elsewhere classified  Rationale for Evaluation and Treatment Rehabilitation  PERTINENT HISTORY: Chronic lymphocytic leukemia   PRECAUTIONS:  Fall and Other: active cancer   SUBJECTIVE:                                                                                                                                                                                      SUBJECTIVE STATEMENT: "A little bit of discomfort in my back."   PAIN:  Are you having pain? Yes: NPRS scale: 4/10 Pain location: low back Pain description: discomfort Aggravating factors: standing,walking Relieving factors: rest    OBJECTIVE: (objective  measures completed at initial evaluation unless otherwise dated)  DIAGNOSTIC FINDINGS:  Lumbar X-ray: IMPRESSION: Multilevel spondylosis, disc space height loss, and degenerative endplate changes greatest at L2-L3 and L3-L4 where it is advanced.   PATIENT SURVEYS:  FOTO 61% function to 66% predicted 05/12/22: 69% function    SCREENING FOR RED FLAGS: Bowel or bladder incontinence: No Spinal tumors: No Cauda equina syndrome: No Compression fracture: No Abdominal aneurysm: No   COGNITION: Overall cognitive status: Within functional limits for tasks assessed                          SENSATION: Not tested   MUSCLE LENGTH: Hamstrings: moderate tightness bilaterally  Thomas test: not formally tested, but notable tightness when patient lies in supine    POSTURE:  forward flexed posture   PALPATION: Not assessed    LUMBAR ROM:    AROM eval  Flexion WNL  Extension 75% limited   Right lateral flexion 50% limited pn  Left lateral flexion 50% limited   Right rotation WNL  Left rotation WNL   (Blank rows = not tested)   LOWER EXTREMITY ROM:      Active  Right eval Left eval  Hip flexion      Hip extension      Hip abduction      Hip adduction      Hip internal rotation      Hip external rotation      Knee flexion      Knee extension      Ankle dorsiflexion      Ankle plantarflexion      Ankle inversion      Ankle eversion       (Blank rows = not tested)   LOWER EXTREMITY MMT:      MMT Right eval Left eval  Hip flexion 4 4-  Hip extension Unable to lie prone Unable to lie prone  Hip abduction 4+ 4+  Hip adduction      Hip internal rotation      Hip external rotation      Knee flexion 5 5  Knee extension 5 5  Ankle dorsiflexion      Ankle plantarflexion      Ankle inversion      Ankle eversion       (Blank rows = not tested)   LUMBAR SPECIAL TESTS:  Not tested    FUNCTIONAL TESTS:  5 x STS: 14 seconds  04/24/22: 5 x STS: 12 seconds    6 MWT: 1096ft BORG RPE 12; after 2 minutes he reported low back discomfort and fatigue in lower extremities   05/01/22: 6 MWT 1099 reports fatigue in thighs, but states that it is less compared to initial eval.   05/22/22: 6 MWT: 1189 ft      GAIT: Distance walked: 6 MWT  Assistive device utilized: None Level of assistance: Complete Independence Comments: forward flexed posture, WBOS [facility] Adult PT Treatment:                                                DATE: 05/29/22 Therapeutic Exercise: Bike level 2 x 5 minutes  Supine posterior pelvic tilt multiple reps Supine TA march 2 x 10  Supine bent knee fallout 2 x 10  SLR with posterior pelvic tilt 2 x 10  Updated HEP     [facility] Adult PT Treatment:                                                DATE: 05/22/22 Therapeutic Exercise: 6 minute walk test Standing trunk flexion 1 x 10  Leg press 3 x 10 @ 40 lbs  Step ups 8 inch 2 x 10  Cybex hip abduction 2 x 10 @ 25 lbs Updated HEP    [facility] Adult PT Treatment:                                                DATE: 05/12/22 Therapeutic Exercise: NuStep level 6 x 5 minutes UE/LE Resisted knee extension 3 x 10 @ 15 lbs  Resisted hamstring curls 3 x 10 @ 20 lbs  Resisted hip flexion cybex 3 x 10 @ 25 lbs  Leg press 3 x 10 @ 40 lbs     [facility] Adult PT Treatment:                                                DATE: 05/08/22 Therapeutic Exercise: NuStep level 5 x 5 minutes UE/LE  Hip bridge 2 x 10  SKTC x 5; 5 sec hold   Sit to stand 2 x 10; 2nd set with 5 lb kettle bell  Fwd step ups 6 inch step 2 x 10  Seated trunk flexion x 10  Lateral step ups 6 inch 2 x 10  Prone quad stretch x 60 sec each  Sidelying hip abduction 2 x 10  Updated HEP      PATIENT EDUCATION:  Education details: HEP update Person educated: Patient Education method: Explanation,demo, cues, handout Education comprehension: verbalized understanding, returned demo, cues    HOME EXERCISE PROGRAM: Access Code: [email or web address] URL: [email or web address]    ASSESSMENT:   CLINICAL IMPRESSION: Patient arrives with mild low back pain. Focused on core stabilization with patient having initial difficulty completing posterior pelvic. With continued reps and verbal/tactile cueing he is able to properly perform and reports that the back felt "good." Intermittent cues required for breathing with majority of core strengthening. HEP was updated to include core stabilization. No reports of back pain at conclusion of session.    OBJECTIVE IMPAIRMENTS: Abnormal gait, decreased activity tolerance, decreased endurance, decreased knowledge of condition, difficulty walking, decreased ROM, decreased strength, impaired flexibility, postural dysfunction, and pain.    ACTIVITY LIMITATIONS: standing and locomotion level   PARTICIPATION LIMITATIONS: meal prep, cleaning, laundry, shopping, community activity, and yard work   PERSONAL FACTORS: Age, Fitness, Time since onset of injury/illness/exacerbation, and 1 comorbidity: Chronic lymphocytic leukemia   are also affecting patient's functional outcome.    REHAB POTENTIAL: Good   CLINICAL DECISION MAKING: Evolving/moderate complexity   EVALUATION COMPLEXITY: Moderate     GOALS: Goals reviewed with patient? Yes   SHORT TERM GOALS: Target date: 05/12/2022       Patient will be independent and compliant with initial HEP.    Baseline: see above Goal status: met   2.  Patient will complete 5 x  STS in </=  12 seconds to improve functional strength.  Baseline: see above  Goal status: met   3.  Patient will be able to complete 10 minutes of continuous standing activity with minimal fatigue/discomfort reported.  Baseline: reported back discomfort and tiredness in his legs after 2 minutes of walking.  05/01/22: 12 minutes standing strengthening+ 6 minute walk Goal status: met     LONG TERM GOALS: Target date: 06/13/2022       Patient will walk at least 1,368 ft during 6 MWT to signify an improvement in walking endurance.  Baseline: see above Goal status: INITIAL   2.  Patient will demonstrate 4+/5 bilateral hip flexor strength to improve stability with walking activity.  Baseline: see above Goal status: INITIAL   3.  Patient will be able to complete 30 minutes of continuous standing activity to improve tolerance to completing household activities.  Baseline: see above Goal status: INITIAL   4.  Patient will score at least 66% on FOTO to signify clinically meaningful improvement in functional abilities.    Baseline: see above Goal status: met   5.  Patient will be independent with advanced home program to progress/maintain current level of function.  Baseline: see above  Goal status: INITIAL     PLAN:   PT FREQUENCY: 1-2x/week   PT DURATION: 8 weeks   PLANNED INTERVENTIONS: Therapeutic exercises, Therapeutic activity, Neuromuscular re-education, Balance training, Gait training, Patient/Family education, Self Care, Dry Needling, Manual therapy, and Re-evaluation.   PLAN FOR NEXT SESSION: review and progress HEP prn; standing LE strengthening.     Letitia Libra, PT, DPT, ATC 05/29/22 4:16 PM

## 2022-05-30 DIAGNOSIS — C9111 Chronic lymphocytic leukemia of B-cell type in remission: Secondary | ICD-10-CM | POA: Diagnosis not present

## 2022-06-04 NOTE — Therapy (Signed)
OUTPATIENT PHYSICAL THERAPY TREATMENT NOTE PHYSICAL THERAPY DISCHARGE SUMMARY  Visits from Start of Care: 9  Current functional level related to goals / functional outcomes: See goals below   Remaining deficits: Intermittent back pain Intermittent lower extremity "fatigue/weakness"  Limited trunk extension ROM   Education / Equipment: See education below    Patient agrees to discharge. Patient goals were partially met. Patient is being discharged due to maximized rehab potential.    Patient Name: Brandon Norris MRN: [id number] DOB:January 24, 1930, 89 y.o., male Today's Date: 06/05/2022  PCP: Merri Brunette, MD  REFERRING PROVIDER: Rodolph Bong, MD   END OF SESSION:   PT End of Session - 06/05/22 0930     Visit Number 9    Number of Visits 17    Date for PT Re-Evaluation 06/13/22    Authorization Type MCR    PT Start Time 0930    PT Stop Time 1010    PT Time Calculation (min) 40 min    Activity Tolerance Patient tolerated treatment well    Behavior During Therapy WFL for tasks assessed/performed                   Past Medical History:  Diagnosis Date   Actinic keratoses    Allergic rhinitis    Atypical nevi    Diverticulosis    Enlarged prostate    Glaucoma    Glaucoma    H/O degenerative disc disease    Hx of adenomatous colonic polyps    Malignant melanoma of skin of abdomen (HCC) 2016   Rotator cuff tendinitis    Thrombocytopenia (HCC)    Past Surgical History:  Procedure Laterality Date   HERNIA REPAIR     Left inguinal herniorrhaphy     Melanoma on abdomen  2016   Patient Active Problem List   Diagnosis Date Noted   Skin lesion of left lower limb 09/30/2021   Hypercalcemia 09/13/2020   CLL (chronic lymphocytic leukemia) (HCC) 01/21/2016   Elevated WBC count 01/16/2016   Glaucoma 01/16/2016   Thrombocytopenia (HCC) 01/16/2016   Diverticulosis 01/16/2016   Encounter for colonoscopy due to history of adenomatous colonic polyps  01/16/2016   Allergic rhinitis 01/16/2016    REFERRING DIAG:  M54.41,M54.42,G89.29 (ICD-10-CM) - Chronic midline low back pain with bilateral sciatica  R29.898 (ICD-10-CM) - Weakness of both lower extremities     THERAPY DIAG:  Other low back pain  Muscle weakness (generalized)  Difficulty in walking, not elsewhere classified  Rationale for Evaluation and Treatment Rehabilitation  PERTINENT HISTORY: Chronic lymphocytic leukemia   PRECAUTIONS: Fall and Other: active cancer   SUBJECTIVE:  SUBJECTIVE STATEMENT: Patient reports the "fatigue/tiredness" continues to be present in his thighs intermittently and feels weakness if he walks for awhile. He reports his back pain has been "on/off" and relates it to how much he stands. He has an MRI scheduled for next week.    PAIN:  Are you having pain? None currently NPRS scale: 3 at worst/10 Pain location: low back Pain description: discomfort Aggravating factors: standing,walking Relieving factors: rest, flexion movement    OBJECTIVE: (objective measures completed at initial evaluation unless otherwise dated)  DIAGNOSTIC FINDINGS:  Lumbar X-ray: IMPRESSION: Multilevel spondylosis, disc space height loss, and degenerative endplate changes greatest at L2-L3 and L3-L4 where it is advanced.   PATIENT SURVEYS:  FOTO 61% function to 66% predicted 05/12/22: 69% function    SCREENING FOR RED FLAGS: Bowel or bladder incontinence: No Spinal tumors: No Cauda equina syndrome: No Compression fracture: No Abdominal aneurysm: No   COGNITION: Overall cognitive status: Within functional limits for tasks assessed                          SENSATION: Not tested   MUSCLE LENGTH: Hamstrings: moderate tightness bilaterally  Thomas test: not formally tested, but  notable tightness when patient lies in supine    POSTURE:  forward flexed posture   PALPATION: Not assessed    LUMBAR ROM:    AROM eval 06/05/22  Flexion WNL WNL  Extension 75% limited  75% limited  Right lateral flexion 50% limited pn 25% limited  Left lateral flexion 50% limited  25% limited  Right rotation WNL WNL  Left rotation WNL WNL   (Blank rows = not tested)   LOWER EXTREMITY ROM:      Active  Right eval Left eval  Hip flexion      Hip extension      Hip abduction      Hip adduction      Hip internal rotation      Hip external rotation      Knee flexion      Knee extension      Ankle dorsiflexion      Ankle plantarflexion      Ankle inversion      Ankle eversion       (Blank rows = not tested)   LOWER EXTREMITY MMT:     MMT Right eval Left eval 06/05/22  Hip flexion 4 4- 5/5 bilateral  Hip extension Unable to lie prone Unable to lie prone   Hip abduction 4+ 4+ 5/5 bilateral  Hip adduction       Hip internal rotation       Hip external rotation       Knee flexion 5 5   Knee extension 5 5   Ankle dorsiflexion       Ankle plantarflexion       Ankle inversion       Ankle eversion        (Blank rows = not tested)   LUMBAR SPECIAL TESTS:  Not tested    FUNCTIONAL TESTS:  5 x STS: 14 seconds  04/24/22: 5 x STS: 12 seconds    6 MWT: 1039ft BORG RPE 12; after 2 minutes he reported low back discomfort and fatigue in lower extremities   05/01/22: 6 MWT 1099 reports fatigue in thighs, but states that it is less compared to initial eval.   05/22/22: 6 MWT: 1189 ft   06/05/22: 6 MWT: 1248 ft  GAIT: Distance walked: 6 MWT  Assistive device utilized: None Level of assistance: Complete Independence Comments: forward flexed posture, WBOS [facility] Adult PT Treatment:                                                DATE: 06/05/22 Therapeutic Exercise: Performed advanced HEP Exercises - Seated Flexion Stretch  - 6 x daily - 7 x weekly - 1 sets - 10  reps - Standing Forward Trunk Flexion  - 6 x daily - 7 x weekly - 1 sets - 10 reps - Hooklying Single Knee to Chest Stretch  - 1 x daily - 7 x weekly - 1 sets - 10 reps - 5sec  hold - Supine Double Knee to Chest  - 1 x daily - 7 x weekly - 1 sets - 10 reps - 5 sec  hold - Seated Hamstring Stretch  - 1 x daily - 7 x weekly - 3 sets - 30 sec  hold - Supine Posterior Pelvic Tilt  - 1 x daily - 3 x weekly - 2 sets - 10 reps - 5 sec  hold - Sidelying Hip Abduction  - 1 x daily - 3 x weekly - 2 sets - 10 reps - Supine March with Posterior Pelvic Tilt  - 1 x daily - 3 x weekly - 2 sets - 10 reps - Standing Hip Abduction with Counter Support  - 1 x daily - 3 x weekly - 2 sets - 10 reps - Standing March with Counter Support  - 1 x daily - 3 x weekly - 2 sets - 10 reps - Standing Hip Extension with Counter Support  - 1 x daily - 3 x weekly - 2 sets - 10 reps - Sit to Stand  - 1 x daily - 3 x weekly - 2 sets - 10 reps  Therapeutic Activity: Re-assessment to determine overall progress, educating patient on progress towards goals.     [facility] Adult PT Treatment:                                                DATE: 05/29/22 Therapeutic Exercise: Bike level 2 x 5 minutes  Supine posterior pelvic tilt multiple reps Supine TA march 2 x 10  Supine bent knee fallout 2 x 10  SLR with posterior pelvic tilt 2 x 10  Updated HEP     [facility] Adult PT Treatment:                                                DATE: 05/22/22 Therapeutic Exercise: 6 minute walk test Standing trunk flexion 1 x 10  Leg press 3 x 10 @ 40 lbs  Step ups 8 inch 2 x 10  Cybex hip abduction 2 x 10 @ 25 lbs Updated HEP       PATIENT EDUCATION:  Education details: see treatment; d/c education  Person educated: Patient Education method: Explanation,demo, cues, handout Education comprehension: verbalized understanding, returned demo   HOME EXERCISE PROGRAM: Access Code: [email or web address] URL: [email or web address]     ASSESSMENT:   CLINICAL  IMPRESSION: Patient has attended 9 PT sessions reporting ongoing bilateral thigh "fatigue/tiredness" with prolonged walking/standing activity. He does report ability to stand/walk for longer duration compared to evaluation, but continues to endorse leg weakness/back pain with prolonged activity.  He does appear to have a flexion preference of movement reporting reduction in back pain with repeated flexion. He demonstrates overall improvements in trunk mobility and hip/core strength since the start of care. He has met all short term functional goals and 4/5 long term functional goals. He has maximized his rehab potential and is appropriate for discharge with recommendation to f/u with Dr. Denyse Amass after his MRI next week with patient in agreement with this plan.    OBJECTIVE IMPAIRMENTS: Abnormal gait, decreased activity tolerance, decreased endurance, decreased knowledge of condition, difficulty walking, decreased ROM, decreased strength, impaired flexibility, postural dysfunction, and pain.    ACTIVITY LIMITATIONS: standing and locomotion level   PARTICIPATION LIMITATIONS: meal prep, cleaning, laundry, shopping, community activity, and yard work   PERSONAL FACTORS: Age, Fitness, Time since onset of injury/illness/exacerbation, and 1 comorbidity: Chronic lymphocytic leukemia   are also affecting patient's functional outcome.    REHAB POTENTIAL: Good   CLINICAL DECISION MAKING: Evolving/moderate complexity   EVALUATION COMPLEXITY: Moderate     GOALS: Goals reviewed with patient? Yes   SHORT TERM GOALS: Target date: 05/12/2022       Patient will be independent and compliant with initial HEP.    Baseline: see above Goal status: met   2.  Patient will complete 5 x STS in </= 12 seconds to improve functional strength.  Baseline: see above  Goal status: met   3.  Patient will be able to complete 10 minutes of continuous standing activity with minimal  fatigue/discomfort reported.  Baseline: reported back discomfort and tiredness in his legs after 2 minutes of walking.  05/01/22: 12 minutes standing strengthening+ 6 minute walk Goal status: met     LONG TERM GOALS: Target date: 06/13/2022       Patient will walk at least 1,368 ft during 6 MWT to signify an improvement in walking endurance.  Baseline: see above Goal status: nearly met   2.  Patient will demonstrate 4+/5 bilateral hip flexor strength to improve stability with walking activity.  Baseline: see above Goal status: met   3.  Patient will be able to complete 30 minutes of continuous standing activity to improve tolerance to completing household activities.  Baseline: see above 06/05/22: was able to vacuum for 40 minutes  Goal status: met   4.  Patient will score at least 66% on FOTO to signify clinically meaningful improvement in functional abilities.    Baseline: see above Goal status: met   5.  Patient will be independent with advanced home program to progress/maintain current level of function.  Baseline: see above  Goal status: met     PLAN:   PT FREQUENCY: n/a   PT DURATION: n/a   PLANNED INTERVENTIONS: Therapeutic exercises, Therapeutic activity, Neuromuscular re-education, Balance training, Gait training, Patient/Family education, Self Care, Dry Needling, Manual therapy, and Re-evaluation.   PLAN FOR NEXT SESSION: n/a   Letitia Libra, PT, DPT, ATC 06/05/22 10:11 AM

## 2022-06-05 ENCOUNTER — Ambulatory Visit: Payer: Medicare Other

## 2022-06-05 DIAGNOSIS — M5459 Other low back pain: Secondary | ICD-10-CM

## 2022-06-05 DIAGNOSIS — R262 Difficulty in walking, not elsewhere classified: Secondary | ICD-10-CM

## 2022-06-05 DIAGNOSIS — M6281 Muscle weakness (generalized): Secondary | ICD-10-CM

## 2022-06-12 ENCOUNTER — Ambulatory Visit
Admission: RE | Admit: 2022-06-12 | Discharge: 2022-06-12 | Disposition: A | Discharge status: Home / Self Care | Payer: Medicare Other | Source: Ambulatory Visit | Attending: Family Medicine | Admitting: Family Medicine

## 2022-06-12 DIAGNOSIS — G8929 Other chronic pain: Secondary | ICD-10-CM

## 2022-06-12 DIAGNOSIS — M47816 Spondylosis without myelopathy or radiculopathy, lumbar region: Secondary | ICD-10-CM | POA: Diagnosis not present

## 2022-06-12 DIAGNOSIS — M48061 Spinal stenosis, lumbar region without neurogenic claudication: Secondary | ICD-10-CM | POA: Diagnosis not present

## 2022-06-23 NOTE — Progress Notes (Signed)
MRI lumbar spine shows areas where the nerves could be pinched causing leg pain.  Recommend return to clinic to go over the results in full detail and discuss potential treatment options including injections.

## 2022-06-26 ENCOUNTER — Ambulatory Visit: Payer: Medicare Other | Admitting: Family Medicine

## 2022-06-30 ENCOUNTER — Ambulatory Visit (INDEPENDENT_AMBULATORY_CARE_PROVIDER_SITE_OTHER): Payer: Medicare Other | Admitting: Family Medicine

## 2022-06-30 VITALS — BP 112/62 | HR 69 | Ht 69.0 in | Wt 147.0 lb

## 2022-06-30 DIAGNOSIS — G8929 Other chronic pain: Secondary | ICD-10-CM | POA: Diagnosis not present

## 2022-06-30 DIAGNOSIS — M5442 Lumbago with sciatica, left side: Secondary | ICD-10-CM | POA: Diagnosis not present

## 2022-06-30 DIAGNOSIS — R29898 Other symptoms and signs involving the musculoskeletal system: Secondary | ICD-10-CM | POA: Diagnosis not present

## 2022-06-30 DIAGNOSIS — M5441 Lumbago with sciatica, right side: Secondary | ICD-10-CM

## 2022-06-30 IMAGING — CT CT CHEST HIGH RESOLUTION
2 of 8 series · 14 of 36 positions shown, 17 images · non-contrast
Comparison: Chest CT 09/18/2020.

CLINICAL DATA: 89-year-old male with history of increasing
shortness of breath. Evaluate for interstitial lung disease.

EXAM:
CT CHEST WITHOUT CONTRAST
TECHNIQUE: Multidetector CT imaging of the chest was performed following the
standard protocol without intravenous contrast. High resolution
imaging of the lungs, as well as inspiratory and expiratory imaging,
was performed.

[Series 5: high resolution · axial · 0.66mm/px · z∈[-324,-64]mm · 11 of 315 slices shown, 14 images]
[im 27/315  mediastinal]
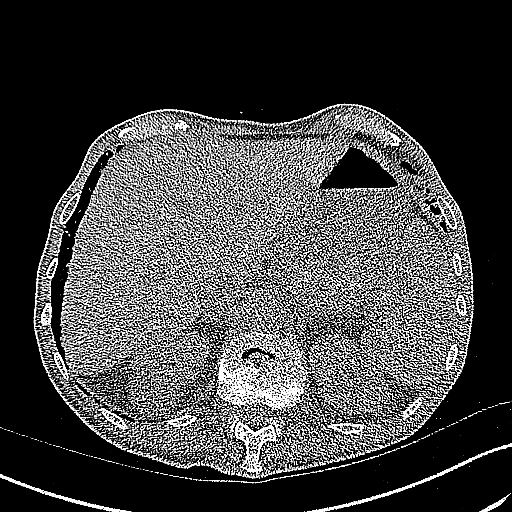
[im 27/315  lung]
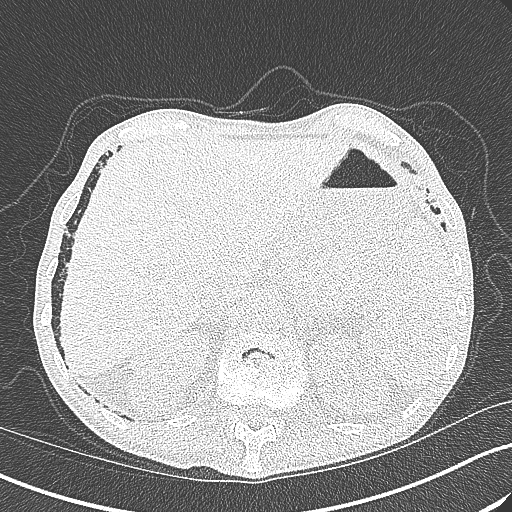
[im 53/315  lung]
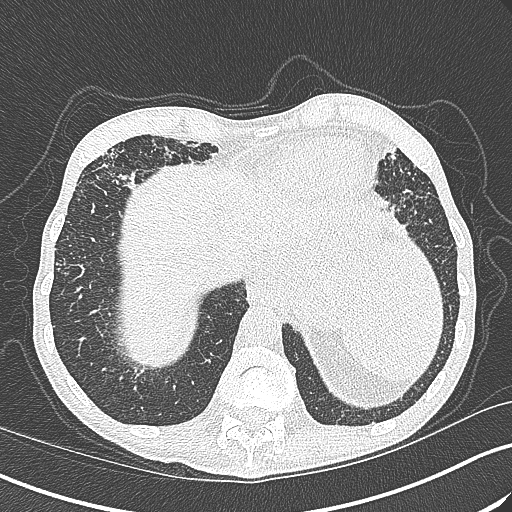
[im 79/315  lung]
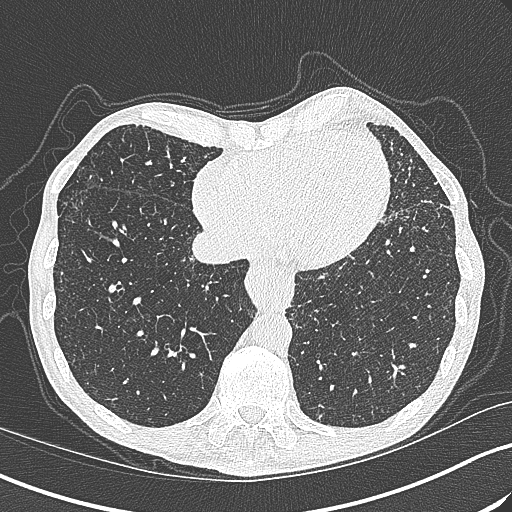
[im 105/315  lung]
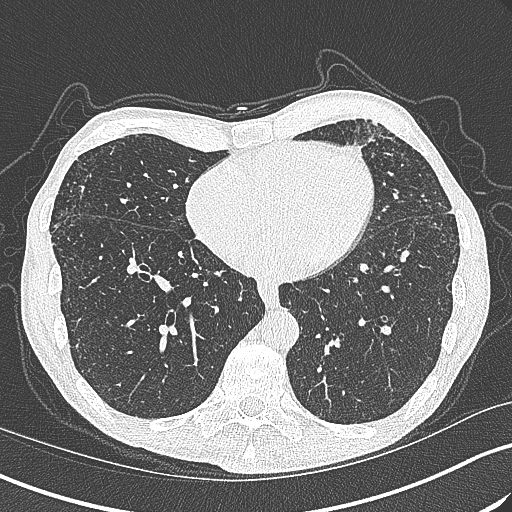
[im 131/315  mediastinal]
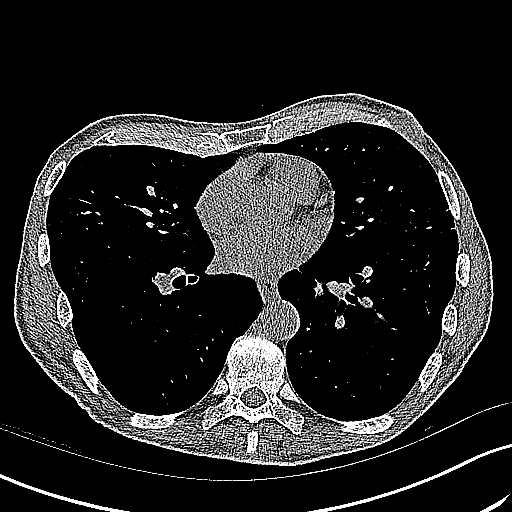
[im 131/315  lung]
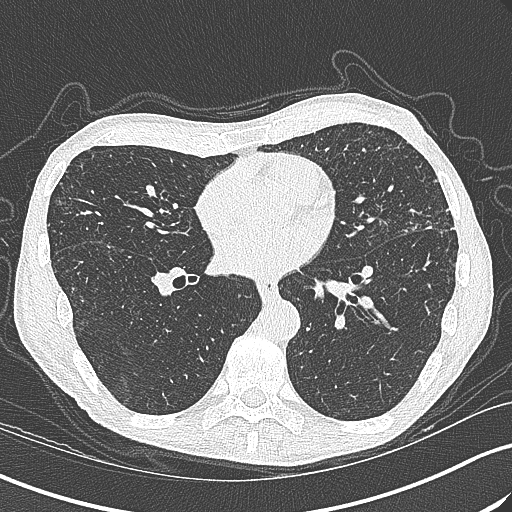
[im 158/315  lung]
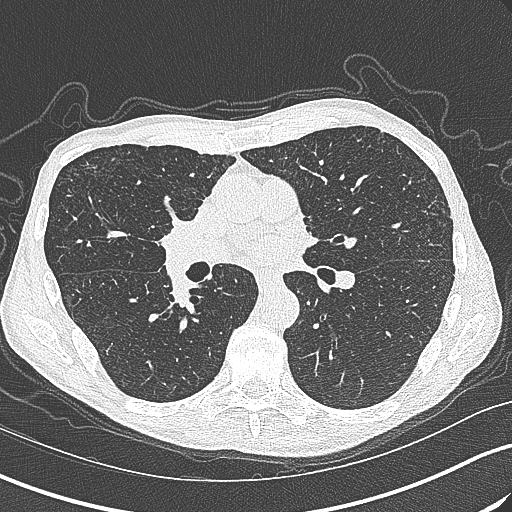
[im 184/315  lung]
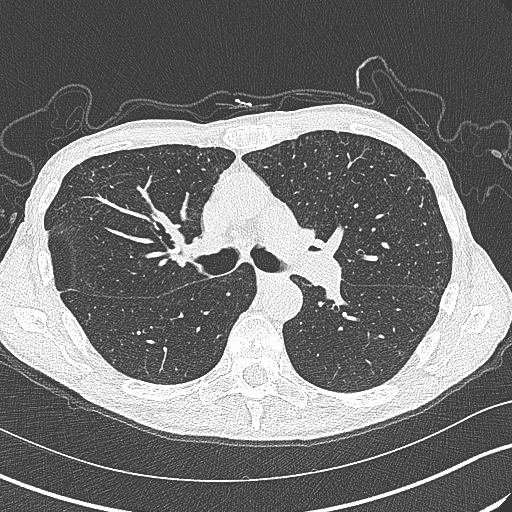
[im 210/315  lung]
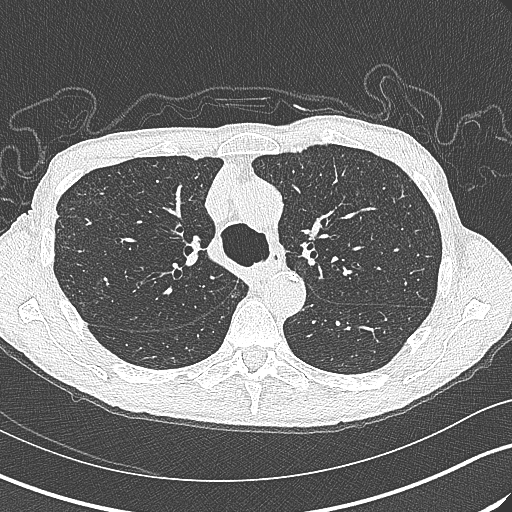
[im 236/315  mediastinal]
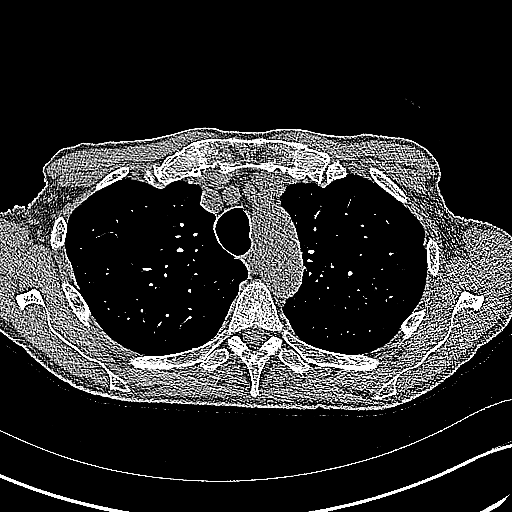
[im 236/315  lung]
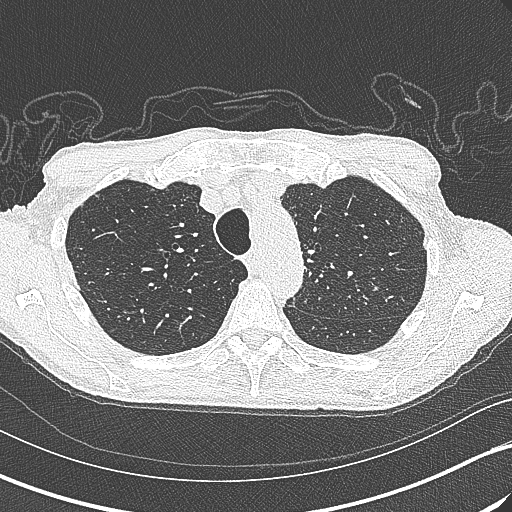
[im 262/315  lung]
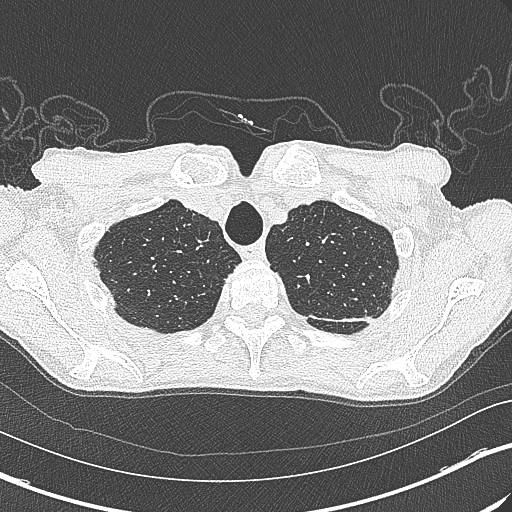
[im 288/315  lung]
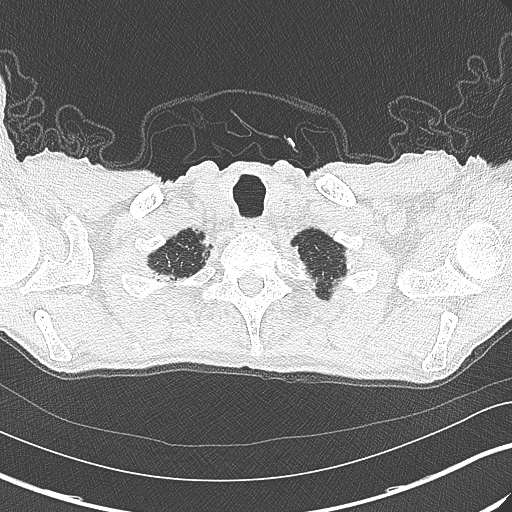

[Series 7: coronal · coronal · 0.59mm/px · 3 of 120 slices shown]
[im 24/120  lung]
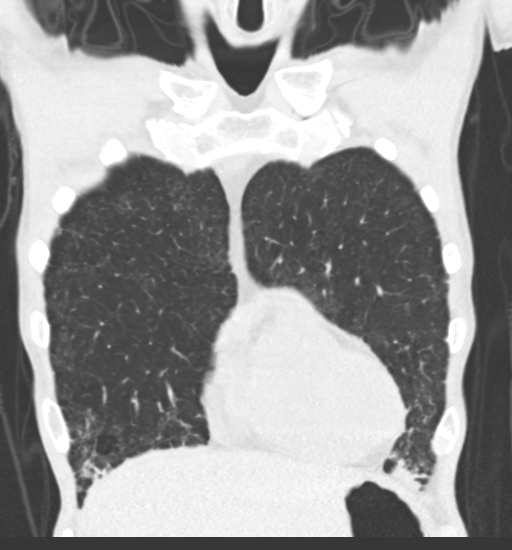
[im 48/120  lung]
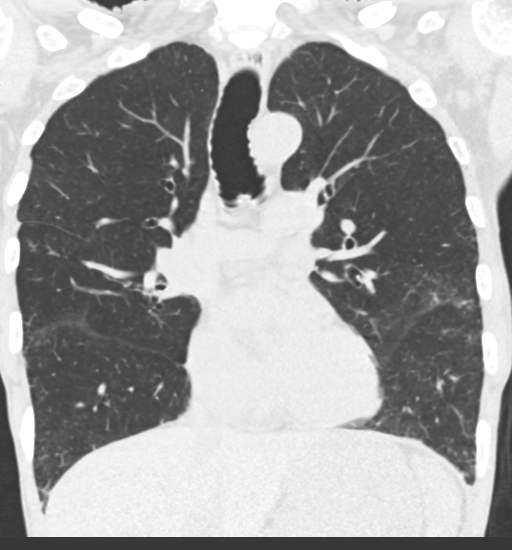
[im 72/120  lung]
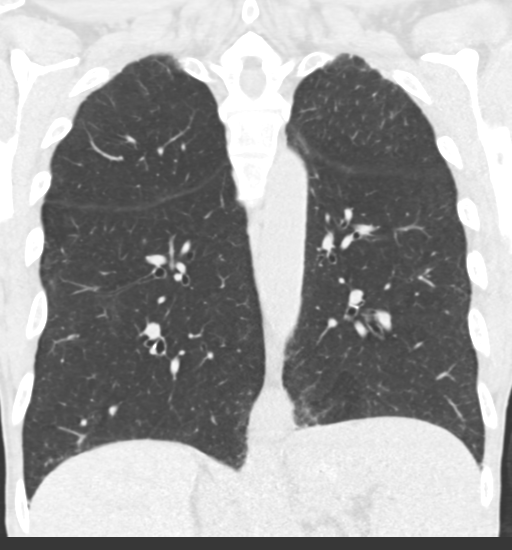

[14 of 36 positions shown; findings below may reference images not displayed]

FINDINGS: Cardiovascular: Heart size is normal. There is no significant
pericardial fluid, thickening or pericardial calcification. Aortic
atherosclerosis. No definite coronary artery calcifications.

Mediastinum/Nodes: No pathologically enlarged mediastinal or hilar
lymph nodes. Please note that accurate exclusion of hilar adenopathy
is limited on noncontrast CT scans. Severe circumferential
thickening of the distal third of the esophagus immediately before
the esophageal hiatus, mass-like in appearance, measuring
approximally 3.8 x 3.5 cm.

Lungs/Pleura: There continues to be some patchy areas of
ground-glass attenuation, septal thickening, mild cylindrical
bronchiectasis, peripheral bronchiolectasis, thickening of the
peribronchovascular interstitium and peribronchovascular micro and
macro nodularity scattered throughout the lungs bilaterally, with a
definitive craniocaudal gradient. Overall, findings appear minimally
improved compared to the prior study. No frank honeycombing.
Inspiratory and expiratory imaging is unremarkable. No confluent
consolidative airspace disease. No pleural effusions.

Upper Abdomen: Low-attenuation lesion in the upper pole of the right
kidney measuring 4.6 x 3.7 cm, incompletely characterized on today's
non-contrast CT examination, but similar to the prior study and
statistically likely to represent a cyst. Aortic atherosclerosis.

Musculoskeletal: There are no aggressive appearing lytic or blastic
lesions noted in the visualized portions of the skeleton.
IMPRESSION: 1. Slight improvement in the appearance of the lungs compared to the
prior examination, with an overall spectrum of findings favored to
reflect a chronic indolent atypical infectious process such as MILI
(mycobacterium avium intracellulare) rather than interstitial lung
disease.
2. Aortic atherosclerosis.
3. Mass-like appearance of the distal third of the esophagus
immediately before the esophageal hiatus. This is poorly evaluated
on today's noncontrast CT examination. A moderate-sized hiatal
hernia was noted in this region on the prior examination, and this
may simply represent a decompressed hiatal hernia, however, the
possibility of gastric and/or distal esophageal neoplasm is not
excluded, and further evaluation with nonemergent endoscopy should
be considered if clinically appropriate.

Aortic Atherosclerosis ([id number]).

## 2022-06-30 NOTE — Progress Notes (Unsigned)
Brandon Payor, PhD, LAT, ATC acting as a scribe for Brandon Graham, MD.  Brandon Norris is a 89 y.o. male who presents to [facility] today for f/u LBP w/ bilat leg weakness w/ MRI review. Pt was last seen by Dr. Denyse Amass on 05/19/22 and a L-spine MRI was ordered. Pt cont'd PT, completing 9 visits, d/c May 30th.   Today, pt reports his back pain is about the same. Pain located along the midline of his low back and radiating into the anterior of both thighs. He reports the pain in his back is more of a constant aggravating pain.   Dx imaging: 06/12/22 L-spine MRI  04/07/22 L-spine XR  Pertinent review of systems: No fevers or chills  Relevant historical information: CLL   Exam:  BP 112/62   Pulse 69   Ht 5\' 9"  (1.753 m)   Wt 147 lb (66.7 kg)   SpO2 95%   BMI 21.71 kg/m  General: Well Developed, well nourished, and in no acute distress.   MSK: L-spine nontender palpation spinal midline. Normal lumbar motion. Lower extremity strength is intact.    Lab and Radiology Results  EXAM: MRI LUMBAR SPINE WITHOUT CONTRAST   TECHNIQUE: Multiplanar, multisequence MR imaging of the lumbar spine was performed. No intravenous contrast was administered.   COMPARISON:  Lumbar radiographs 04/07/2022   FINDINGS: Segmentation: There are five lumbar type vertebral bodies. The last full intervertebral disc space is labeled L5-S1.   Alignment:  Scoliosis and degenerative lumbar spondylosis.   Vertebrae: Low T1 signal intensity likely related to smoking anemia or osteoporosis. No bone lesions or fractures.   Conus medullaris and cauda equina: Conus extends to the L1 level. Conus and cauda equina appear normal.   Paraspinal and other soft tissues: Simple 5 cm cyst associated with the right kidney not requiring any further imaging evaluation or follow-up.   Disc levels:   T12-L1: No significant findings.   L1-2: Mild annular bulge and moderate facet  disease but no significant spinal foraminal stenosis.   L2-3: Advanced degenerative disc disease with marked disc space narrowing. Bulging annulus, osteophytic ridging and moderate facet disease with mild right lateral recess stenosis and mild foraminal narrowing. No significant spinal stenosis.   L3-4: Bulging degenerated annulus, osteophytic ridging and advanced facet disease contributing to mild spinal stenosis and moderate bilateral lateral recess stenosis and moderate bilateral foraminal stenosis.   L4-5: Disc disease and facet disease but no significant spinal stenosis. Moderate left foraminal stenosis.   L5-S1: Degenerative disc disease and facet disease. No significant spinal stenosis. There is mild left lateral recess and moderate left foraminal stenosis.   IMPRESSION: 1. Scoliosis and degenerative lumbar spondylosis with multilevel disc disease and facet disease. 2. Mild right lateral recess stenosis and mild foraminal narrowing at L2-3. 3. Mild spinal stenosis, moderate bilateral lateral recess stenosis and moderate bilateral foraminal stenosis at L3-4. 4. Moderate left foraminal stenosis at L4-5. 5. Mild left lateral recess stenosis and moderate left foraminal stenosis at L5-S1.     Electronically Signed   By: Rudie Meyer M.D.   On: 06/22/2022 06:53 I, Brandon Norris, personally (independently) visualized and performed the interpretation of the images attached in this note.      Assessment and Plan: 89 y.o. male with lumbar radiculopathy thought to be L3 nerve roots bilaterally.  Plan for epidural steroid injection.  He will keep me updated with how he feels after the injection.  Check back as needed.  PDMP not reviewed this encounter. Orders Placed This Encounter  Procedures   DG INJECT DIAG/THERA/INC NEEDLE/CATH/PLC EPI/LUMB/SAC W/IMG    LUMB EPI #1  [employer] MCR  PACS (06/12/22)  147 LBS NO ASA NO THINS/OTC NKDA TO CT CM  NO NEEDS *office notes 06/30/22 in  epic*    Standing Status:   Future    Standing Expiration Date:   06/30/2023    Order Specific Question:   Reason for Exam (SYMPTOM  OR DIAGNOSIS REQUIRED)    Answer:   ESI or NRB or both. Likely L3 symptoms. Leg weakness. Level and technique per radiology    Order Specific Question:   Preferred Imaging Location?    Answer:   [id number]    Order Specific Question:   Radiology Contrast Protocol - do NOT remove file path    Answer:   [email or web address]   No orders of the defined types were placed in this encounter.    Discussed warning signs or symptoms. Please see discharge instructions. Patient expresses understanding.   The above documentation has been reviewed and is accurate and complete Brandon Norris, M.D.

## 2022-06-30 NOTE — Patient Instructions (Addendum)
Thank you for coming in today.   Please call [facility] at [phone] to schedule your spine injection.   Check back as needed   

## 2022-07-08 ENCOUNTER — Ambulatory Visit
Admission: RE | Admit: 2022-07-08 | Discharge: 2022-07-08 | Disposition: A | Discharge status: Home / Self Care | Payer: Medicare Other | Source: Ambulatory Visit | Attending: Family Medicine | Admitting: Family Medicine

## 2022-07-08 DIAGNOSIS — R29898 Other symptoms and signs involving the musculoskeletal system: Secondary | ICD-10-CM

## 2022-07-08 DIAGNOSIS — M4726 Other spondylosis with radiculopathy, lumbar region: Secondary | ICD-10-CM | POA: Diagnosis not present

## 2022-07-08 DIAGNOSIS — G8929 Other chronic pain: Secondary | ICD-10-CM

## 2022-07-08 MED ORDER — METHYLPREDNISOLONE ACETATE 40 MG/ML INJ SUSP (RADIOLOG
80.0000 mg | Freq: Once | INTRAMUSCULAR | Status: AC
  Administered 2022-07-08: 80 mg via EPIDURAL

## 2022-07-08 MED ORDER — IOPAMIDOL (ISOVUE-M 200) INJECTION 41%
1.0000 mL | Freq: Once | INTRAMUSCULAR | Status: AC
  Administered 2022-07-08: 1 mL via EPIDURAL

## 2022-07-08 NOTE — Discharge Instructions (Signed)

## 2022-09-02 DIAGNOSIS — H35373 Puckering of macula, bilateral: Secondary | ICD-10-CM | POA: Diagnosis not present

## 2022-09-02 DIAGNOSIS — H0100B Unspecified blepharitis left eye, upper and lower eyelids: Secondary | ICD-10-CM | POA: Diagnosis not present

## 2022-09-02 DIAGNOSIS — H0100A Unspecified blepharitis right eye, upper and lower eyelids: Secondary | ICD-10-CM | POA: Diagnosis not present

## 2022-09-02 DIAGNOSIS — H52201 Unspecified astigmatism, right eye: Secondary | ICD-10-CM | POA: Diagnosis not present

## 2022-09-02 DIAGNOSIS — H401133 Primary open-angle glaucoma, bilateral, severe stage: Secondary | ICD-10-CM | POA: Diagnosis not present

## 2022-09-02 DIAGNOSIS — H524 Presbyopia: Secondary | ICD-10-CM | POA: Diagnosis not present

## 2022-11-10 ENCOUNTER — Other Ambulatory Visit: Payer: Self-pay

## 2022-11-10 DIAGNOSIS — C9110 Chronic lymphocytic leukemia of B-cell type not having achieved remission: Secondary | ICD-10-CM

## 2022-11-11 ENCOUNTER — Inpatient Hospital Stay: Payer: Medicare Other | Attending: Hematology

## 2022-11-11 ENCOUNTER — Inpatient Hospital Stay: Payer: Medicare Other | Admitting: Hematology

## 2022-12-09 ENCOUNTER — Inpatient Hospital Stay (HOSPITAL_BASED_OUTPATIENT_CLINIC_OR_DEPARTMENT_OTHER): Payer: Medicare Other | Admitting: Hematology

## 2022-12-09 ENCOUNTER — Inpatient Hospital Stay: Payer: Medicare Other | Attending: Hematology

## 2022-12-09 ENCOUNTER — Other Ambulatory Visit: Payer: Self-pay

## 2022-12-09 VITALS — BP 145/55 | HR 65 | Temp 98.1°F | Resp 18 | Ht 69.0 in | Wt 144.0 lb

## 2022-12-09 DIAGNOSIS — D696 Thrombocytopenia, unspecified: Secondary | ICD-10-CM | POA: Diagnosis not present

## 2022-12-09 DIAGNOSIS — D539 Nutritional anemia, unspecified: Secondary | ICD-10-CM | POA: Insufficient documentation

## 2022-12-09 DIAGNOSIS — C9110 Chronic lymphocytic leukemia of B-cell type not having achieved remission: Secondary | ICD-10-CM | POA: Diagnosis not present

## 2022-12-09 DIAGNOSIS — R161 Splenomegaly, not elsewhere classified: Secondary | ICD-10-CM | POA: Insufficient documentation

## 2022-12-09 LAB — RETIC PANEL
Immature Retic Fract: 7.6 % (ref 2.3–15.9)
RBC.: 3.34 MIL/uL — ABNORMAL LOW (ref 4.22–5.81)
Retic Count, Absolute: 55.8 10*3/uL (ref 19.0–186.0)
Retic Ct Pct: 1.7 % (ref 0.4–3.1)
Reticulocyte Hemoglobin: 33.6 pg (ref >27.9)

## 2022-12-09 LAB — CBC WITH DIFFERENTIAL (CANCER CENTER ONLY)
Abs Immature Granulocytes: 0.04 10*3/uL (ref 0.00–0.07)
Basophils Absolute: 0.1 10*3/uL (ref 0.0–0.1)
Basophils Relative: 0 %
Eosinophils Absolute: 0.2 10*3/uL (ref 0.0–0.5)
Eosinophils Relative: 1 %
HCT: 33.6 % — ABNORMAL LOW (ref 39.0–52.0)
Hemoglobin: 10.9 g/dL — ABNORMAL LOW (ref 13.0–17.0)
Immature Granulocytes: 0 %
Lymphocytes Relative: 75 %
Lymphs Abs: 21 10*3/uL — ABNORMAL HIGH (ref 0.7–4.0)
MCH: 32.9 pg (ref 26.0–34.0)
MCHC: 32.4 g/dL (ref 30.0–36.0)
MCV: 101.5 fL — ABNORMAL HIGH (ref 80.0–100.0)
Monocytes Absolute: 4.6 10*3/uL — ABNORMAL HIGH (ref 0.1–1.0)
Monocytes Relative: 16 %
Neutro Abs: 2.3 10*3/uL (ref 1.7–7.7)
Neutrophils Relative %: 8 %
Platelet Count: 98 10*3/uL — ABNORMAL LOW (ref 150–400)
RBC: 3.31 MIL/uL — ABNORMAL LOW (ref 4.22–5.81)
RDW: 15.1 % (ref 11.5–15.5)
Smear Review: NORMAL
WBC Count: 28.2 10*3/uL — ABNORMAL HIGH (ref 4.0–10.5)
nRBC: 0 % (ref 0.0–0.2)

## 2022-12-09 LAB — CMP (CANCER CENTER ONLY)
ALT: 13 U/L (ref 0–44)
AST: 15 U/L (ref 15–41)
Albumin: 4.1 g/dL (ref 3.5–5.0)
Alkaline Phosphatase: 63 U/L (ref 38–126)
Anion gap: 5 (ref 5–15)
BUN: 28 mg/dL — ABNORMAL HIGH (ref 8–23)
CO2: 30 mmol/L (ref 22–32)
Calcium: 9.4 mg/dL (ref 8.9–10.3)
Chloride: 106 mmol/L (ref 98–111)
Creatinine: 1.16 mg/dL (ref 0.61–1.24)
GFR, Estimated: 60 mL/min — ABNORMAL LOW (ref >60)
Glucose, Bld: 112 mg/dL — ABNORMAL HIGH (ref 70–99)
Potassium: 4.1 mmol/L (ref 3.5–5.1)
Sodium: 141 mmol/L (ref 135–145)
Total Bilirubin: 0.4 mg/dL (ref <1.2)
Total Protein: 6.8 g/dL (ref 6.5–8.1)

## 2022-12-09 LAB — FERRITIN: Ferritin: 56 ng/mL (ref 24–336)

## 2022-12-09 NOTE — Progress Notes (Signed)
[name]  HEMATOLOGY ONCOLOGY CLINIC NOTE  Date of service: 12/09/22     Patient Care Team: Merri Brunette, MD as PCP - General (Internal Medicine)  Chief Complaint:  Follow-up for continued evaluation and management of CLL  Diagnosis: CLL  Current Treatment: Monitoring  INTERVAL HISTORY:  Brandon Norris is a 89 y.o. male here for continued evaluation and management of his CLL.   Patient was last seen by me on 05/13/2022 and he complained of bilateral leg weakness and knee problems, but was doing well overall.   Patient notes he has been doing well overall since our last visit. He complains of mild bilateral leg weakness/pain. He had physical therapy which did not improve his bilateral leg weakness/pain. However, he notes drinking lot of water improves his pain.   He denies any new infection issues, fever, chills, night sweats, unexpected weight loss, back pain, chest pain, abdominal pain, new lumps/bumps, testicular pain, testicular swelling, or leg swelling. He denies abnormal bleeding or fatigue.   He has been eating well and has gained weight since our last visit.   Patient regularly takes vitamin-D supplement and B-complex.   He is UTD with RSV vaccine.   REVIEW OF SYSTEMS:   .10 Point review of Systems was done is negative except as noted above.  Past Medical History:  Diagnosis Date   Actinic keratoses    Allergic rhinitis    Atypical nevi    Diverticulosis    Enlarged prostate    Glaucoma    Glaucoma    H/O degenerative disc disease    Hx of adenomatous colonic polyps    Malignant melanoma of skin of abdomen (HCC) 2016   Rotator cuff tendinitis    Thrombocytopenia (HCC)     . Past Surgical History:  Procedure Laterality Date   HERNIA REPAIR     Left inguinal herniorrhaphy     Melanoma on abdomen  2016     Social History   Tobacco Use   Smoking status: Never   Smokeless tobacco: Never  Vaping Use   Vaping status: Never Used  Substance Use  Topics   Alcohol use: Yes    Alcohol/week: 3.0 standard drinks of alcohol    Types: 3 Glasses of wine per week   Drug use: No    ALLERGIES:  has No Known Allergies.  MEDICATIONS:  Current Outpatient Medications  Medication Sig Dispense Refill   b complex vitamins capsule Take 1 capsule by mouth daily.     Cholecalciferol (VITAMIN D3) 50 MCG (2000 UT) TABS Take 1 tablet by mouth daily at 2 PM.     latanoprost (XALATAN) 0.005 % ophthalmic solution 1 drop into BOTH  eye in the evening     Multiple Vitamin (MULTIVITAMINS PO)      timolol (BETIMOL) 0.25 % ophthalmic solution Place 1 drop into both eyes 2 (two) times daily.     vitamin B-12 (CYANOCOBALAMIN) 250 MCG tablet Take 250 mcg by mouth daily.     No current facility-administered medications for this visit.    PHYSICAL EXAMINATION: .BP (!) 145/55 (BP Location: Right Arm) Comment: nurse notified  Pulse 65   Temp 98.1 F (36.7 C) (Temporal)   Resp 18   Ht 5\' 9"  (1.753 m)   Wt 144 lb (65.3 kg)   SpO2 98%   BMI 21.27 kg/m  NAD GENERAL:alert, in no acute distress and comfortable SKIN: no acute rashes. Ulcerated 3 cm x 3 cm lesion on the medial aspect of upper left  leg. EYES: conjunctiva are pink and non-injected, sclera anicteric NECK: supple, no JVD LYMPH:  no palpable lymphadenopathy in the cervical, axillary or inguinal regions LUNGS: clear to auscultation b/l with normal respiratory effort HEART: regular rate & rhythm ABDOMEN:  normoactive bowel sounds , non tender, not distended. Extremity: no pedal edema PSYCH: alert & oriented x 3 with fluent speech NEURO: no focal motor/sensory deficits  LABORATORY DATA:   I have reviewed the data as listed  .    Latest Ref Rng & Units 12/09/2022    1:27 PM 05/13/2022    1:24 PM 01/10/2022    9:13 AM  CBC  WBC 4.0 - 10.5 K/uL 28.2  25.3  22.8   Hemoglobin 13.0 - 17.0 g/dL 16.1  09.6  04.5   Hematocrit 39.0 - 52.0 % 33.6  35.1  33.6   Platelets 150 - 400 K/uL 98  105  121      .    Latest Ref Rng & Units 12/09/2022    1:27 PM 05/13/2022    1:24 PM 01/10/2022    9:13 AM  CMP  Glucose 70 - 99 mg/dL 409  811  914   BUN 8 - 23 mg/dL 28  25  21    Creatinine 0.61 - 1.24 mg/dL 7.82  9.56  2.13   Sodium 135 - 145 mmol/L 141  142  143   Potassium 3.5 - 5.1 mmol/L 4.1  4.2  4.5   Chloride 98 - 111 mmol/L 106  105  108   CO2 22 - 32 mmol/L 30  31  32   Calcium 8.9 - 10.3 mg/dL 9.4  9.3  9.6   Total Protein 6.5 - 8.1 g/dL 6.8  7.1  6.7   Total Bilirubin <1.2 mg/dL 0.4  0.5  0.5   Alkaline Phos 38 - 126 U/L 63  73  72   AST 15 - 41 U/L 15  14  17    ALT 0 - 44 U/L 13  11  15      [place] ABD (01/28/2016)  ABDOMEN ULTRASOUND COMPLETE   COMPARISON:  None.   FINDINGS: Gallbladder: No gallstones or wall thickening visualized. There is no pericholecystic fluid. No sonographic Murphy sign noted by sonographer.   Common bile duct: Diameter: 2 mm. There is no intrahepatic, common hepatic, or common bile duct dilatation.   Liver: No focal lesion identified. Within normal limits in parenchymal echogenicity.   IVC: No abnormality visualized.   Pancreas: Visualized portion unremarkable. Portions of pancreas obscured by gas.   Spleen: Spleen measures 16.0 x 14.2 x 5.8 cm with a measures splenic volume of 621 cubic cm. No focal splenic lesions are evident.   Right Kidney: Length: 11.4 cm. Echogenicity within normal limits. No hydronephrosis visualized. There is a parapelvic cyst in the left kidney measuring 5.1 x 3.5 x 6.3 cm.   Left Kidney: Length: 11.1 cm. Echogenicity within normal limits. No mass or hydronephrosis visualized.   Abdominal aorta: No aneurysm visualized.   Other findings: No demonstrable ascites.   IMPRESSION: Splenomegaly.  No focal splenic lesions evident.   Parapelvic cyst right kidney measuring 5.1 x 3.5 x 6.3 cm.   Portions of pancreas obscured by gas. Visualized portions of pancreas appear normal.   Study otherwise unremarkable.      Electronically Signed   By: Bretta Bang III M.D.   On: 01/28/2016 14:09  FISH, CLL Prognostic on 08/26/16   RADIOGRAPHIC STUDIES: I have personally reviewed the radiological images as  listed and agreed with the findings in the report. No results found.  ASSESSMENT & PLAN:   89 y.o. male with  1) Rai Stage 2 Chronic lymphocytic leukemia. (with splenomegaly) Trisomy 12 and a 13q deletion.  Would be stage II if splenomegaly was considered to be related to the CLL. Patient however notes that he has had splenomegaly for decades without clear etiology noted previously.  WBC counts are not changed significantly. No anemia Mild thrombocytopenia that is not significantly changed platelets around 101k.  No constitutional symptoms. No issues with infections/bleeding. No clinically palpable lymphadenopathy   2) status post anorexia with weight loss.  Patient is now eating much better and has gained 7 pounds in the last 2 to 3 months.  Resolved with steroids  3) previous hypercalcemia and elevated creatinine which had resolved.  Resolved with steroids  4) macrocytic anemia B12 borderline low at 281 up from 233. Has been taking B complex 1 capsule daily Would recommend taking additional B12 1000 mcg daily over-the-counter.   5) Incidentally noted left parapelvic right kidney cyst measuring 5.1 x 3.5 x 6.3 cm in size. Ct abd- 9/13-- simple rt renal cyst noted. No concerning renal lesions.   6) . Patient Active Problem List   Diagnosis Date Noted   Skin lesion of left lower limb 09/30/2021   Hypercalcemia 09/13/2020   CLL (chronic lymphocytic leukemia) (HCC) 01/21/2016   Elevated WBC count 01/16/2016   Glaucoma 01/16/2016   Thrombocytopenia (HCC) 01/16/2016   Diverticulosis 01/16/2016   Encounter for colonoscopy due to history of adenomatous colonic polyps 01/16/2016   Allergic rhinitis 01/16/2016  -f/u with PCP for mx  PLAN: -Discussed lab results from today, 12/09/2022,  in detail with the patient. CBC shows elevated WBC of 28.2 K, low hemoglobin at 10.9 g/dL with hematocrit of 16.1%, and low platelets of 98 K.  CMP stable -Patient has no lab or clinical symptoms suggestive of significant CLL progression at this time to warrant initiating treatment. -Patient continues to want to take a conservative approach from a CLL treatment standpoint.  -Recommend influenza vaccine, COVID-19 Booster, and other age related vaccines.   FOLLOW-UP: RTC with Dr Candise Che with labs in 6 months   The total time spent in the appointment was 21 minutes* .  All of the patient's questions were answered with apparent satisfaction. The patient knows to call the clinic with any problems, questions or concerns.   Wyvonnia Lora MD MS AAHIVMS [facility] Hematology/Oncology Physician [facility]  .*Total Encounter Time as defined by the Centers for Medicare and Medicaid Services includes, in addition to the face-to-face time of a patient visit (documented in the note above) non-face-to-face time: obtaining and reviewing outside history, ordering and reviewing medications, tests or procedures, care coordination (communications with other health care professionals or caregivers) and documentation in the medical record.   I,Param Shah,acting as a Neurosurgeon for Wyvonnia Lora, MD.,have documented all relevant documentation on the behalf of Wyvonnia Lora, MD,as directed by  Wyvonnia Lora, MD while in the presence of Wyvonnia Lora, MD.  .I have reviewed the above documentation for accuracy and completeness, and I agree with the above. Johney Maine MD

## 2022-12-15 ENCOUNTER — Encounter: Payer: Self-pay | Admitting: Hematology

## 2023-03-01 ENCOUNTER — Encounter: Payer: Self-pay | Admitting: Internal Medicine

## 2023-03-03 DIAGNOSIS — K080 Exfoliation of teeth due to systemic causes: Secondary | ICD-10-CM | POA: Diagnosis not present

## 2023-03-10 DIAGNOSIS — H401133 Primary open-angle glaucoma, bilateral, severe stage: Secondary | ICD-10-CM | POA: Diagnosis not present

## 2023-03-25 DIAGNOSIS — D696 Thrombocytopenia, unspecified: Secondary | ICD-10-CM | POA: Diagnosis not present

## 2023-03-25 DIAGNOSIS — R7303 Prediabetes: Secondary | ICD-10-CM | POA: Diagnosis not present

## 2023-03-25 DIAGNOSIS — I700 Atherosclerosis of aorta: Secondary | ICD-10-CM | POA: Diagnosis not present

## 2023-03-30 DIAGNOSIS — E538 Deficiency of other specified B group vitamins: Secondary | ICD-10-CM | POA: Diagnosis not present

## 2023-03-30 DIAGNOSIS — J309 Allergic rhinitis, unspecified: Secondary | ICD-10-CM | POA: Diagnosis not present

## 2023-03-30 DIAGNOSIS — I700 Atherosclerosis of aorta: Secondary | ICD-10-CM | POA: Diagnosis not present

## 2023-03-30 DIAGNOSIS — C9110 Chronic lymphocytic leukemia of B-cell type not having achieved remission: Secondary | ICD-10-CM | POA: Diagnosis not present

## 2023-03-30 DIAGNOSIS — Z0000 Encounter for general adult medical examination without abnormal findings: Secondary | ICD-10-CM | POA: Diagnosis not present

## 2023-03-30 DIAGNOSIS — D696 Thrombocytopenia, unspecified: Secondary | ICD-10-CM | POA: Diagnosis not present

## 2023-03-30 DIAGNOSIS — R161 Splenomegaly, not elsewhere classified: Secondary | ICD-10-CM | POA: Diagnosis not present

## 2023-04-09 DIAGNOSIS — L720 Epidermal cyst: Secondary | ICD-10-CM | POA: Diagnosis not present

## 2023-04-09 DIAGNOSIS — D692 Other nonthrombocytopenic purpura: Secondary | ICD-10-CM | POA: Diagnosis not present

## 2023-04-09 DIAGNOSIS — Z85828 Personal history of other malignant neoplasm of skin: Secondary | ICD-10-CM | POA: Diagnosis not present

## 2023-04-09 DIAGNOSIS — D0461 Carcinoma in situ of skin of right upper limb, including shoulder: Secondary | ICD-10-CM | POA: Diagnosis not present

## 2023-04-09 DIAGNOSIS — L570 Actinic keratosis: Secondary | ICD-10-CM | POA: Diagnosis not present

## 2023-04-09 DIAGNOSIS — L821 Other seborrheic keratosis: Secondary | ICD-10-CM | POA: Diagnosis not present

## 2023-04-09 DIAGNOSIS — D0421 Carcinoma in situ of skin of right ear and external auricular canal: Secondary | ICD-10-CM | POA: Diagnosis not present

## 2023-04-27 ENCOUNTER — Encounter: Payer: Self-pay | Admitting: Internal Medicine

## 2023-04-27 DIAGNOSIS — D0421 Carcinoma in situ of skin of right ear and external auricular canal: Secondary | ICD-10-CM | POA: Diagnosis not present

## 2023-06-08 ENCOUNTER — Other Ambulatory Visit: Payer: Self-pay

## 2023-06-08 DIAGNOSIS — C9110 Chronic lymphocytic leukemia of B-cell type not having achieved remission: Secondary | ICD-10-CM

## 2023-06-08 DIAGNOSIS — D696 Thrombocytopenia, unspecified: Secondary | ICD-10-CM

## 2023-06-09 ENCOUNTER — Inpatient Hospital Stay: Payer: Medicare Other | Attending: Hematology | Admitting: Hematology

## 2023-06-09 ENCOUNTER — Inpatient Hospital Stay: Payer: Medicare Other

## 2023-06-09 VITALS — BP 121/63 | HR 66 | Temp 97.7°F | Resp 18 | Wt 142.6 lb

## 2023-06-09 DIAGNOSIS — N281 Cyst of kidney, acquired: Secondary | ICD-10-CM | POA: Diagnosis not present

## 2023-06-09 DIAGNOSIS — D539 Nutritional anemia, unspecified: Secondary | ICD-10-CM | POA: Diagnosis not present

## 2023-06-09 DIAGNOSIS — D696 Thrombocytopenia, unspecified: Secondary | ICD-10-CM

## 2023-06-09 DIAGNOSIS — C9110 Chronic lymphocytic leukemia of B-cell type not having achieved remission: Secondary | ICD-10-CM | POA: Insufficient documentation

## 2023-06-09 LAB — CBC WITH DIFFERENTIAL (CANCER CENTER ONLY)
Abs Immature Granulocytes: 0 10*3/uL (ref 0.00–0.07)
Basophils Absolute: 0 10*3/uL (ref 0.0–0.1)
Basophils Relative: 0 %
Eosinophils Absolute: 0.5 10*3/uL (ref 0.0–0.5)
Eosinophils Relative: 2 %
HCT: 31 % — ABNORMAL LOW (ref 39.0–52.0)
Hemoglobin: 10 g/dL — ABNORMAL LOW (ref 13.0–17.0)
Lymphocytes Relative: 88 %
Lymphs Abs: 23.1 10*3/uL — ABNORMAL HIGH (ref 0.7–4.0)
MCH: 31.9 pg (ref 26.0–34.0)
MCHC: 32.3 g/dL (ref 30.0–36.0)
MCV: 99 fL (ref 80.0–100.0)
Monocytes Absolute: 0.3 10*3/uL (ref 0.1–1.0)
Monocytes Relative: 1 %
Neutro Abs: 2.4 10*3/uL (ref 1.7–7.7)
Neutrophils Relative %: 9 %
Platelet Count: 97 10*3/uL — ABNORMAL LOW (ref 150–400)
RBC: 3.13 MIL/uL — ABNORMAL LOW (ref 4.22–5.81)
RDW: 15.3 % (ref 11.5–15.5)
WBC Count: 26.2 10*3/uL — ABNORMAL HIGH (ref 4.0–10.5)
nRBC: 0 % (ref 0.0–0.2)

## 2023-06-09 LAB — CMP (CANCER CENTER ONLY)
ALT: 11 U/L (ref 0–44)
AST: 14 U/L — ABNORMAL LOW (ref 15–41)
Albumin: 4.1 g/dL (ref 3.5–5.0)
Alkaline Phosphatase: 62 U/L (ref 38–126)
Anion gap: 6 (ref 5–15)
BUN: 24 mg/dL — ABNORMAL HIGH (ref 8–23)
CO2: 29 mmol/L (ref 22–32)
Calcium: 9.3 mg/dL (ref 8.9–10.3)
Chloride: 107 mmol/L (ref 98–111)
Creatinine: 1.19 mg/dL (ref 0.61–1.24)
GFR, Estimated: 58 mL/min — ABNORMAL LOW (ref >60)
Glucose, Bld: 119 mg/dL — ABNORMAL HIGH (ref 70–99)
Potassium: 4.1 mmol/L (ref 3.5–5.1)
Sodium: 142 mmol/L (ref 135–145)
Total Bilirubin: 0.4 mg/dL (ref 0.0–1.2)
Total Protein: 6.8 g/dL (ref 6.5–8.1)

## 2023-06-09 LAB — RETIC PANEL
Immature Retic Fract: 13.5 % (ref 2.3–15.9)
RBC.: 3.14 MIL/uL — ABNORMAL LOW (ref 4.22–5.81)
Retic Count, Absolute: 58.4 10*3/uL (ref 19.0–186.0)
Retic Ct Pct: 1.9 % (ref 0.4–3.1)
Reticulocyte Hemoglobin: 35.2 pg (ref >27.9)

## 2023-06-09 LAB — LACTATE DEHYDROGENASE: LDH: 167 U/L (ref 98–192)

## 2023-06-09 LAB — FERRITIN: Ferritin: 107 ng/mL (ref 24–336)

## 2023-06-09 NOTE — Progress Notes (Signed)
 HEMATOLOGY ONCOLOGY CLINIC NOTE  Date of service: 06/09/23     Patient Care Team: Imelda Man, MD as PCP - General (Internal Medicine)  Chief Complaint:  Follow-up for continued evaluation and management of CLL  Diagnosis: CLL  Current Treatment: Monitoring  INTERVAL HISTORY:  Brandon Norris is a 89 y.o. male here for continued evaluation and management of his CLL.   Patient was last seen by me on 12/09/2022 and complained of mild bilateral leg weakness/pain.  He complains of tightness in his upper legs below the buttocks. Patient denies pain necessarily. He notes that physical therapy does not improve his discomfort.   Patient reports that he generally feels tired after standing for some time and notes that walking duration is limited to some extent.   He denies any new lumps/bump, change in breathing, abdominal pain/distention, new leg swelling, new skin rashes, fever, chills, night sweats, or new fatigue.   He reports having problems in his lower back.   Patient reports that his weight has been fairly stable around 140 pounds.   He denies any major medication changes.   Patient reports that he has been eating well and continues to take vitamin D and vitamin B complex.   He continues to follow with his dermatologist regularly and notes recently having a spot removed from his ear.   He denies any concern for nose bleeds, gum bleeds, black stools, blood in the stools or other reason for blood loss.   REVIEW OF SYSTEMS:   .10 Point review of Systems was done is negative except as noted above.  Past Medical History:  Diagnosis Date   Actinic keratoses    Allergic rhinitis    Atypical nevi    Diverticulosis    Enlarged prostate    Glaucoma    Glaucoma    H/O degenerative disc disease    Hx of adenomatous colonic polyps    Malignant melanoma of skin of abdomen (HCC) 2016   Rotator cuff tendinitis    Thrombocytopenia (HCC)     . Past Surgical History:   Procedure Laterality Date   HERNIA REPAIR     Left inguinal herniorrhaphy     Melanoma on abdomen  2016     Social History   Tobacco Use   Smoking status: Never   Smokeless tobacco: Never  Vaping Use   Vaping status: Never Used  Substance Use Topics   Alcohol use: Yes    Alcohol/week: 3.0 standard drinks of alcohol    Types: 3 Glasses of wine per week   Drug use: No    ALLERGIES:  has no known allergies.  MEDICATIONS:  Current Outpatient Medications  Medication Sig Dispense Refill   b complex vitamins capsule Take 1 capsule by mouth daily.     Cholecalciferol (VITAMIN D3) 50 MCG (2000 UT) TABS Take 1 tablet by mouth daily at 2 PM.     latanoprost (XALATAN) 0.005 % ophthalmic solution 1 drop into BOTH  eye in the evening     Multiple Vitamin (MULTIVITAMINS PO)      timolol (BETIMOL) 0.25 % ophthalmic solution Place 1 drop into both eyes 2 (two) times daily.     vitamin B-12 (CYANOCOBALAMIN ) 250 MCG tablet Take 250 mcg by mouth daily.     No current facility-administered medications for this visit.    PHYSICAL EXAMINATION: .BP 121/63   Pulse 66   Temp 97.7 F (36.5 C)   Resp 18   Wt 142 lb 9.6 oz (64.7  kg)   SpO2 98%   BMI 21.06 kg/m  GENERAL:alert, in no acute distress and comfortable SKIN: no acute rashes, no significant lesions EYES: conjunctiva are pink and non-injected, sclera anicteric OROPHARYNX: MMM, no exudates, no oropharyngeal erythema or ulceration NECK: supple, no JVD LYMPH:  no palpable lymphadenopathy in the cervical, axillary or inguinal regions LUNGS: clear to auscultation b/l with normal respiratory effort HEART: regular rate & rhythm ABDOMEN:  normoactive bowel sounds , non tender, not distended. Extremity: no pedal edema PSYCH: alert & oriented x 3 with fluent speech NEURO: no focal motor/sensory deficits   LABORATORY DATA:   I have reviewed the data as listed  .    Latest Ref Rng & Units 06/09/2023    1:51 PM 12/09/2022    1:27 PM  05/13/2022    1:24 PM  CBC  WBC 4.0 - 10.5 K/uL 26.2  28.2  25.3   Hemoglobin 13.0 - 17.0 g/dL 16.1  09.6  04.5   Hematocrit 39.0 - 52.0 % 31.0  33.6  35.1   Platelets 150 - 400 K/uL 97  98  105     .    Latest Ref Rng & Units 06/09/2023    1:51 PM 12/09/2022    1:27 PM 05/13/2022    1:24 PM  CMP  Glucose 70 - 99 mg/dL 409  811  914   BUN 8 - 23 mg/dL 24  28  25    Creatinine 0.61 - 1.24 mg/dL 7.82  9.56  2.13   Sodium 135 - 145 mmol/L 142  141  142   Potassium 3.5 - 5.1 mmol/L 4.1  4.1  4.2   Chloride 98 - 111 mmol/L 107  106  105   CO2 22 - 32 mmol/L 29  30  31    Calcium 8.9 - 10.3 mg/dL 9.3  9.4  9.3   Total Protein 6.5 - 8.1 g/dL 6.8  6.8  7.1   Total Bilirubin 0.0 - 1.2 mg/dL 0.4  0.4  0.5   Alkaline Phos 38 - 126 U/L 62  63  73   AST 15 - 41 U/L 14  15  14    ALT 0 - 44 U/L 11  13  11      US  ABD (01/28/2016)  ABDOMEN ULTRASOUND COMPLETE   COMPARISON:  None.   FINDINGS: Gallbladder: No gallstones or wall thickening visualized. There is no pericholecystic fluid. No sonographic Murphy sign noted by sonographer.   Common bile duct: Diameter: 2 mm. There is no intrahepatic, common hepatic, or common bile duct dilatation.   Liver: No focal lesion identified. Within normal limits in parenchymal echogenicity.   IVC: No abnormality visualized.   Pancreas: Visualized portion unremarkable. Portions of pancreas obscured by gas.   Spleen: Spleen measures 16.0 x 14.2 x 5.8 cm with a measures splenic volume of 621 cubic cm. No focal splenic lesions are evident.   Right Kidney: Length: 11.4 cm. Echogenicity within normal limits. No hydronephrosis visualized. There is a parapelvic cyst in the left kidney measuring 5.1 x 3.5 x 6.3 cm.   Left Kidney: Length: 11.1 cm. Echogenicity within normal limits. No mass or hydronephrosis visualized.   Abdominal aorta: No aneurysm visualized.   Other findings: No demonstrable ascites.   IMPRESSION: Splenomegaly.  No focal splenic  lesions evident.   Parapelvic cyst right kidney measuring 5.1 x 3.5 x 6.3 cm.   Portions of pancreas obscured by gas. Visualized portions of pancreas appear normal.   Study otherwise unremarkable.  Electronically Signed   By: Mordecai Applebaum III M.D.   On: 01/28/2016 14:09  FISH, CLL Prognostic on 08/26/16   RADIOGRAPHIC STUDIES: I have personally reviewed the radiological images as listed and agreed with the findings in the report. No results found.  ASSESSMENT & PLAN:   89 y.o. male with  1) Rai Stage 2 Chronic lymphocytic leukemia. (with splenomegaly) Trisomy 12 and a 13q deletion.  Would be stage II if splenomegaly was considered to be related to the CLL. Patient however notes that he has had splenomegaly for decades without clear etiology noted previously.  WBC counts are not changed significantly. No anemia Mild thrombocytopenia that is not significantly changed platelets around 101k.  No constitutional symptoms. No issues with infections/bleeding. No clinically palpable lymphadenopathy   2) status post anorexia with weight loss.  Patient is now eating much better and has gained 7 pounds in the last 2 to 3 months.  Resolved with steroids  3) previous hypercalcemia and elevated creatinine which had resolved.  Resolved with steroids  4) macrocytic anemia B12 borderline low at 281 up from 233. Has been taking B complex 1 capsule daily Would recommend taking additional B12 1000 mcg daily over-the-counter.   5) Incidentally noted left parapelvic right kidney cyst measuring 5.1 x 3.5 x 6.3 cm in size. Ct abd- 9/13-- simple rt renal cyst noted. No concerning renal lesions.   6) . Patient Active Problem List   Diagnosis Date Noted   Skin lesion of left lower limb 09/30/2021   Hypercalcemia 09/13/2020   CLL (chronic lymphocytic leukemia) (HCC) 01/21/2016   Elevated WBC count 01/16/2016   Glaucoma 01/16/2016   Thrombocytopenia (HCC) 01/16/2016    Diverticulosis 01/16/2016   Encounter for colonoscopy due to history of adenomatous colonic polyps 01/16/2016   Allergic rhinitis 01/16/2016  -f/u with PCP for mx  PLAN:  -Discussed lab results on 06/09/23 in detail with patient. CBC fairly stable, showed WBC of 26.2K, hemoglobin of 10, and platelets of 97K. -WBCs stable  -he is getting mildly anemic. Hgb noted to previously be 11.4 one year ago and currently down to 10 -platelets are slightly low and fairly stable -CMP stable.20 -recommend nutritional optimization. Recommend regularly consuming fresh fruits and vegetables in the diet -discussed that his anemia could be from CLL itself and we will continue to monitor -continue to take vitamin B complex -Patient has no clinical sign or lab evidence suggestive of significant CLL progression at this time -there is no indication to initiate treatment for CLL at this time -discussed that there can be some lumbar spinal stenosis causing his lower extremity pain -patient shall return to clinic in 6 months  -answered all of patient's questions in detail  FOLLOW-UP: RTC with Dr Salomon Cree with labs in 6 months  The total time spent in the appointment was 20 minutes* .  All of the patient's questions were answered with apparent satisfaction. The patient knows to call the clinic with any problems, questions or concerns.   Jacquelyn Matt MD MS AAHIVMS [facility] Hematology/Oncology Physician [facility]  .*Total Encounter Time as defined by the Centers for Medicare and Medicaid Services includes, in addition to the face-to-face time of a patient visit (documented in the note above) non-face-to-face time: obtaining and reviewing outside history, ordering and reviewing medications, tests or procedures, care coordination (communications with other health care professionals or caregivers) and documentation in the medical record.   I,Mitra Faeizi,acting as a Neurosurgeon for Jacquelyn Matt, MD.,have  documented  all relevant documentation on the behalf of Jacquelyn Matt, MD,as directed by  Jacquelyn Matt, MD while in the presence of Jacquelyn Matt, MD.  .I have reviewed the above documentation for accuracy and completeness, and I agree with the above. .Tauno Falotico Kishore Jamesrobert Ohanesian MD

## 2023-06-15 ENCOUNTER — Encounter: Payer: Self-pay | Admitting: Hematology

## 2023-06-23 DIAGNOSIS — C9111 Chronic lymphocytic leukemia of B-cell type in remission: Secondary | ICD-10-CM | POA: Diagnosis not present

## 2023-07-16 DIAGNOSIS — H401133 Primary open-angle glaucoma, bilateral, severe stage: Secondary | ICD-10-CM | POA: Diagnosis not present

## 2023-09-01 DIAGNOSIS — K080 Exfoliation of teeth due to systemic causes: Secondary | ICD-10-CM | POA: Diagnosis not present

## 2023-10-07 DIAGNOSIS — R053 Chronic cough: Secondary | ICD-10-CM | POA: Diagnosis not present

## 2023-10-07 DIAGNOSIS — R29898 Other symptoms and signs involving the musculoskeletal system: Secondary | ICD-10-CM | POA: Diagnosis not present

## 2023-10-19 ENCOUNTER — Encounter: Payer: Self-pay | Admitting: Internal Medicine

## 2023-11-19 DIAGNOSIS — H401133 Primary open-angle glaucoma, bilateral, severe stage: Secondary | ICD-10-CM | POA: Diagnosis not present

## 2023-11-19 DIAGNOSIS — H43813 Vitreous degeneration, bilateral: Secondary | ICD-10-CM | POA: Diagnosis not present

## 2023-12-07 ENCOUNTER — Other Ambulatory Visit: Payer: Self-pay

## 2023-12-07 DIAGNOSIS — C9110 Chronic lymphocytic leukemia of B-cell type not having achieved remission: Secondary | ICD-10-CM

## 2023-12-08 ENCOUNTER — Inpatient Hospital Stay: Attending: Hematology

## 2023-12-08 ENCOUNTER — Inpatient Hospital Stay: Admitting: Hematology

## 2023-12-08 VITALS — BP 124/54 | HR 72 | Temp 97.5°F | Resp 18 | Wt 137.1 lb

## 2023-12-08 DIAGNOSIS — D539 Nutritional anemia, unspecified: Secondary | ICD-10-CM | POA: Insufficient documentation

## 2023-12-08 DIAGNOSIS — C9110 Chronic lymphocytic leukemia of B-cell type not having achieved remission: Secondary | ICD-10-CM | POA: Insufficient documentation

## 2023-12-08 DIAGNOSIS — D696 Thrombocytopenia, unspecified: Secondary | ICD-10-CM | POA: Diagnosis not present

## 2023-12-08 DIAGNOSIS — N281 Cyst of kidney, acquired: Secondary | ICD-10-CM | POA: Insufficient documentation

## 2023-12-08 DIAGNOSIS — Z803 Family history of malignant neoplasm of breast: Secondary | ICD-10-CM | POA: Diagnosis not present

## 2023-12-08 LAB — CBC WITH DIFFERENTIAL (CANCER CENTER ONLY)
Abs Immature Granulocytes: 0.03 K/uL (ref 0.00–0.07)
Basophils Absolute: 0 K/uL (ref 0.0–0.1)
Basophils Relative: 0 %
Eosinophils Absolute: 0.2 K/uL (ref 0.0–0.5)
Eosinophils Relative: 1 %
HCT: 29.5 % — ABNORMAL LOW (ref 39.0–52.0)
Hemoglobin: 9.6 g/dL — ABNORMAL LOW (ref 13.0–17.0)
Immature Granulocytes: 0 %
Lymphocytes Relative: 81 %
Lymphs Abs: 24 K/uL — ABNORMAL HIGH (ref 0.7–4.0)
MCH: 33 pg (ref 26.0–34.0)
MCHC: 32.5 g/dL (ref 30.0–36.0)
MCV: 101.4 fL — ABNORMAL HIGH (ref 80.0–100.0)
Monocytes Absolute: 3.2 K/uL — ABNORMAL HIGH (ref 0.1–1.0)
Monocytes Relative: 11 %
Neutro Abs: 2 K/uL (ref 1.7–7.7)
Neutrophils Relative %: 7 %
Platelet Count: 114 K/uL — ABNORMAL LOW (ref 150–400)
RBC: 2.91 MIL/uL — ABNORMAL LOW (ref 4.22–5.81)
RDW: 15.8 % — ABNORMAL HIGH (ref 11.5–15.5)
Smear Review: NORMAL
WBC Count: 29.4 K/uL — ABNORMAL HIGH (ref 4.0–10.5)
nRBC: 0 % (ref 0.0–0.2)

## 2023-12-08 LAB — RETIC PANEL
Immature Retic Fract: 11.3 % (ref 2.3–15.9)
RBC.: 2.92 MIL/uL — ABNORMAL LOW (ref 4.22–5.81)
Retic Count, Absolute: 64.2 K/uL (ref 19.0–186.0)
Retic Ct Pct: 2.2 % (ref 0.4–3.1)
Reticulocyte Hemoglobin: 34.3 pg (ref >27.9)

## 2023-12-08 LAB — CMP (CANCER CENTER ONLY)
ALT: 11 U/L (ref 0–44)
AST: 17 U/L (ref 15–41)
Albumin: 4 g/dL (ref 3.5–5.0)
Alkaline Phosphatase: 79 U/L (ref 38–126)
Anion gap: 8 (ref 5–15)
BUN: 24 mg/dL — ABNORMAL HIGH (ref 8–23)
CO2: 28 mmol/L (ref 22–32)
Calcium: 9.3 mg/dL (ref 8.9–10.3)
Chloride: 105 mmol/L (ref 98–111)
Creatinine: 1.1 mg/dL (ref 0.61–1.24)
GFR, Estimated: >60 mL/min (ref >60)
Glucose, Bld: 116 mg/dL — ABNORMAL HIGH (ref 70–99)
Potassium: 4.3 mmol/L (ref 3.5–5.1)
Sodium: 141 mmol/L (ref 135–145)
Total Bilirubin: 0.3 mg/dL (ref 0.0–1.2)
Total Protein: 6.5 g/dL (ref 6.5–8.1)

## 2023-12-08 LAB — LACTATE DEHYDROGENASE: LDH: 205 U/L (ref 105–235)

## 2023-12-08 LAB — FERRITIN: Ferritin: 181 ng/mL (ref 24–336)

## 2023-12-13 NOTE — Progress Notes (Signed)
 HEMATOLOGY ONCOLOGY PROGRESS NOTE  Date of service: 12/08/2023  Patient Care Team: Clarice Nottingham, MD as PCP - General (Internal Medicine)  CHIEF COMPLAINT/PURPOSE OF CONSULTATION: Follow-up for continued evaluation and management of CLL  HISTORY OF PRESENTING ILLNESS: (04/21/2016) Patient is here for scheduled follow-up of his newly diagnosed CLL. As previously seen by Dr. Zelphia.  He notes no new acute concerns. No fevers or chills no night sweats and no unexpected weight loss. WBC counts are not significantly changed. No new anemia or thrombocytopenia. No abdominal pain or distention. Patient notes that he has had splenomegaly for decades - unclear etiology  Current Treatment: Monitoring   INTERVAL HISTORY: Brandon Norris is a 89 y.o. male who is here today for continued evaluation and management of CLL.  he was last seen by me on 06/09/2023; at the time he mentioned experiencing tightness in his upper legs below the buttocks, improved with physical therapy, feeling tired after standing for some time and noted that walking duration was somewhat limited, lower back problems.   Today, he       REVIEW OF SYSTEMS:   10 Point review of systems of done and is negative except as noted above.  MEDICAL HISTORY Past Medical History:  Diagnosis Date   Actinic keratoses    Allergic rhinitis    Atypical nevi    Diverticulosis    Enlarged prostate    Glaucoma    Glaucoma    H/O degenerative disc disease    Hx of adenomatous colonic polyps    Malignant melanoma of skin of abdomen (HCC) 2016   Rotator cuff tendinitis    Thrombocytopenia     SURGICAL HISTORY Past Surgical History:  Procedure Laterality Date   HERNIA REPAIR     Left inguinal herniorrhaphy     Melanoma on abdomen  2016    SOCIAL HISTORY Social History   Tobacco Use   Smoking status: Never   Smokeless tobacco: Never  Vaping Use   Vaping status: Never Used  Substance Use Topics   Alcohol use: Yes     Alcohol/week: 3.0 standard drinks of alcohol    Types: 3 Glasses of wine per week   Drug use: No    Social History   Social History Narrative   Not on file    [employer]IAL [employer]IV[employer] OF HE[employer] SDOH Screenings   Tobacco Use: Low Risk  (06/05/2022)     FAMILY HISTORY Family History  Problem Relation Age of Onset   Dementia Mother    Breast cancer Sister      ALLERGIES: has no known allergies.  MEDICATIONS  Current Outpatient Medications  Medication Sig Dispense Refill   b complex vitamins capsule Take 1 capsule by mouth daily.     Cholecalciferol (VITAMIN D3) 50 MCG (2000 UT) TABS Take 1 tablet by mouth daily at 2 PM.     dorzolamide-timolol (COSOPT) 2-0.5 % ophthalmic solution Place 1 drop into both eyes 2 (two) times daily.     latanoprost (XALATAN) 0.005 % ophthalmic solution 1 drop into BOTH  eye in the evening     Multiple Vitamin (MULTIVITAMINS PO)      timolol (BETIMOL) 0.25 % ophthalmic solution Place 1 drop into both eyes 2 (two) times daily.     vitamin B-12 (CYANOCOBALAMIN ) 250 MCG tablet Take 250 mcg by mouth daily.     No current facility-administered medications for this visit.    PHYSICAL EXAMINATION: ECOG PERFORMANCE STATUS: {CHL ONC ECOG ED:[id number]} VITALS: Vitals:   12/08/23  1404  BP: (!) 124/54  Pulse: 72  Resp: 18  Temp: (!) 97.5 F (36.4 C)  SpO2: 97%   Filed Weights   12/08/23 1404  Weight: 137 lb 1.6 oz (62.2 kg)   Body mass index is 20.25 kg/m.  GENERAL: alert, in no acute distress and comfortable SKIN: no acute rashes, no significant lesions EYES: conjunctiva are pink and non-injected, sclera anicteric OROPHARYNX: MMM, no exudates, no oropharyngeal erythema or ulceration NECK: supple, no JVD LYMPH:  no palpable lymphadenopathy in the cervical, axillary or inguinal regions LUNGS: clear to auscultation b/l with normal respiratory effort HEART: regular rate & rhythm ABDOMEN:  normoactive bowel sounds , non tender, not distended, no  hepatosplenomegaly Extremity: no pedal edema PSYCH: alert & oriented x 3 with fluent speech NEURO: no focal motor/sensory deficits  LABORATORY DATA:   I have reviewed the data as listed     Latest Ref Rng & Units 12/08/2023    1:41 PM 06/09/2023    1:51 PM 06/09/2023    1:50 PM  CBC EXTENDED  WBC 4.0 - 10.5 K/uL 29.4  26.2    RBC 4.22 - 5.81 MIL/uL 4.22 - 5.81 MIL/uL 2.92    2.91  3.13  3.14   Hemoglobin 13.0 - 17.0 g/dL 9.6  89.9    HCT 60.9 - 52.0 % 29.5  31.0    Platelets 150 - 400 K/uL 114  97    NEUT# 1.7 - 7.7 K/uL 2.0  2.4    Lymph# 0.7 - 4.0 K/uL 24.0  23.1      Iron Studies:  Lab Results  Component Value Date   FERRITIN 181 12/08/2023   LDH:  Lab Results  Component Value Date   LDH 205 12/08/2023    RETICULOCYTE PANEL 12/08/2023  Retic Ct Pct     0.4 - 3.1 % 2.2   RBC.     4.22 - 5.81 MIL/uL 2.92 (L)   Retic Count, Absolute     19.0 - 186.0 K/uL 64.2   Immature Retic Fract     2.3 - 15.9 % 11.3   Reticulocyte Hemoglobin     >27.9 pg 34.3        Latest Ref Rng & Units 12/08/2023    1:41 PM 06/09/2023    1:51 PM 12/09/2022    1:27 PM  CMP  Glucose 70 - 99 mg/dL 883  880  887   BUN 8 - 23 mg/dL 24  24  28    Creatinine 0.61 - 1.24 mg/dL 8.89  8.80  8.83   Sodium 135 - 145 mmol/L 141  142  141   Potassium 3.5 - 5.1 mmol/L 4.3  4.1  4.1   Chloride 98 - 111 mmol/L 105  107  106   CO2 22 - 32 mmol/L 28  29  30    Calcium 8.9 - 10.3 mg/dL 9.3  9.3  9.4   Total Protein 6.5 - 8.1 g/dL 6.5  6.8  6.8   Total Bilirubin 0.0 - 1.2 mg/dL 0.3  0.4  0.4   Alkaline Phos 38 - 126 U/L 79  62  63   AST 15 - 41 U/L 17  14  15    ALT 0 - 44 U/L 11  11  13        US  ABD (01/28/2016)   ABDOMEN ULTRASOUND COMPLETE  COMPARISON:  None.   FINDINGS: Gallbladder: No gallstones or wall thickening visualized. There is no pericholecystic fluid. No sonographic Murphy sign noted by sonographer.  Common bile duct: Diameter: 2 mm. There is no intrahepatic, common hepatic, or  common bile duct dilatation.   Liver: No focal lesion identified. Within normal limits in parenchymal echogenicity.   IVC: No abnormality visualized.   Pancreas: Visualized portion unremarkable. Portions of pancreas obscured by gas.   Spleen: Spleen measures 16.0 x 14.2 x 5.8 cm with a measures splenic volume of 621 cubic cm. No focal splenic lesions are evident.   Right Kidney: Length: 11.4 cm. Echogenicity within normal limits. No hydronephrosis visualized. There is a parapelvic cyst in the left kidney measuring 5.1 x 3.5 x 6.3 cm.   Left Kidney: Length: 11.1 cm. Echogenicity within normal limits. No mass or hydronephrosis visualized.   Abdominal aorta: No aneurysm visualized.   Other findings: No demonstrable ascites.   IMPRESSION: Splenomegaly.  No focal splenic lesions evident.   Parapelvic cyst right kidney measuring 5.1 x 3.5 x 6.3 cm.   Portions of pancreas obscured by gas. Visualized portions of pancreas appear normal.   Study otherwise unremarkable.   FISH, CLL Prognostic on 08/26/16     RADIOGRAPHIC STUDIES: I have personally reviewed the radiological images as listed and agreed with the findings in the report. No results found.  ASSESSMENT & PLAN:  89 y.o. male with  1) Rai Stage 2 Chronic lymphocytic leukemia. (with splenomegaly) Trisomy 12 and a 13q deletion.  Would be stage II if splenomegaly was considered to be related to the CLL. Patient however notes that he has had splenomegaly for decades without clear etiology noted previously.   WBC counts are not changed significantly. No anemia Mild thrombocytopenia that is not significantly changed platelets around 101k.  No constitutional symptoms. No issues with infections/bleeding. No clinically palpable lymphadenopathy    2) status post anorexia with weight loss.  Patient is now eating much better and has gained 7 pounds in the last 2 to 3 months.  Resolved with steroids   3) previous  hypercalcemia and elevated creatinine which had resolved.  Resolved with steroids   4) macrocytic anemia B12 borderline low at 281 up from 233. Has been taking B complex 1 capsule daily Would recommend taking additional B12 1000 mcg daily over-the-counter.     5) Incidentally noted left parapelvic right kidney cyst measuring 5.1 x 3.5 x 6.3 cm in size. Ct abd- 9/13-- simple rt renal cyst noted. No concerning renal lesions.     6) . Patient Active Problem List   Diagnosis Date Noted   Skin lesion of left lower limb 09/30/2021   Hypercalcemia 09/13/2020   CLL (chronic lymphocytic leukemia) (HCC) 01/21/2016   Elevated WBC count 01/16/2016   Glaucoma 01/16/2016   Thrombocytopenia 01/16/2016   Diverticulosis 01/16/2016   Encounter for colonoscopy due to history of adenomatous colonic polyps 01/16/2016   Allergic rhinitis 01/16/2016   PLAN: - Discussed lab results on 12/08/2023 in detail with patient: CBC showed WBC of 29.4K increased from 26.2K, Hemoglobin of 9.6 decreased from 10, and PLTs of 114K increased from 97K. CMP with BUN 24 has not changed.  Ferritin: Ferritin 181 LDH 205 Reticulocyte Panel: normal overall with low RBC at 2.92  FOLLOW-UP in 4 months for labs and follow-up with Dr. Onesimo.  The total time spent in the appointment was *** minutes* .  All of the patient's questions were answered and the patient knows to call the clinic with any problems, questions, or concerns.  Brandon Onesimo MD MS AAHIVMS [facility] Hematology/Oncology Physician [facility]  *Total Encounter  Time as defined by the Centers for Medicare and Medicaid Services includes, in addition to the face-to-face time of a patient visit (documented in the note above) non-face-to-face time: obtaining and reviewing outside history, ordering and reviewing medications, tests or procedures, care coordination (communications with other health care professionals or caregivers) and documentation in the  medical record.  I,Brandon Norris,acting as a neurosurgeon for Brandon Saran, MD.,have documented all relevant documentation on the behalf of Brandon Saran, MD,as directed by  Brandon Saran, MD while in the presence of Brandon Saran, MD.  I have reviewed the above documentation for accuracy and completeness, and I agree with the above.  Brandon Hays, MD

## 2023-12-14 ENCOUNTER — Encounter: Payer: Self-pay | Admitting: Hematology

## 2024-04-12 ENCOUNTER — Inpatient Hospital Stay

## 2024-04-12 ENCOUNTER — Inpatient Hospital Stay: Admitting: Hematology
